# Patient Record
Sex: Male | Born: 1960 | Race: White | Hispanic: No | Marital: Single | Smoking: Never smoker
Health system: Southern US, Community
[De-identification: ages and names within clinical notes are randomized; demographics above are authoritative.]

## PROBLEM LIST (undated history)

## (undated) DIAGNOSIS — K219 Gastro-esophageal reflux disease without esophagitis: Secondary | ICD-10-CM

## (undated) DIAGNOSIS — K589 Irritable bowel syndrome without diarrhea: Secondary | ICD-10-CM

## (undated) DIAGNOSIS — K5792 Diverticulitis of intestine, part unspecified, without perforation or abscess without bleeding: Secondary | ICD-10-CM

## (undated) DIAGNOSIS — E119 Type 2 diabetes mellitus without complications: Secondary | ICD-10-CM

## (undated) DIAGNOSIS — F329 Major depressive disorder, single episode, unspecified: Secondary | ICD-10-CM

## (undated) DIAGNOSIS — G629 Polyneuropathy, unspecified: Secondary | ICD-10-CM

## (undated) DIAGNOSIS — G8929 Other chronic pain: Secondary | ICD-10-CM

## (undated) DIAGNOSIS — F419 Anxiety disorder, unspecified: Secondary | ICD-10-CM

## (undated) DIAGNOSIS — F319 Bipolar disorder, unspecified: Secondary | ICD-10-CM

## (undated) DIAGNOSIS — I10 Essential (primary) hypertension: Secondary | ICD-10-CM

## (undated) DIAGNOSIS — G43909 Migraine, unspecified, not intractable, without status migrainosus: Secondary | ICD-10-CM

## (undated) DIAGNOSIS — N200 Calculus of kidney: Secondary | ICD-10-CM

## (undated) DIAGNOSIS — F32A Depression, unspecified: Secondary | ICD-10-CM

## (undated) HISTORY — PX: CHOLECYSTECTOMY: SHX55

## (undated) HISTORY — PX: CARPAL TUNNEL RELEASE: SHX101

## (undated) HISTORY — PX: COLONOSCOPY: SHX174

## (undated) HISTORY — PX: ANKLE SURGERY: SHX546

---

## 1998-07-06 ENCOUNTER — Encounter: Payer: Self-pay | Admitting: Sports Medicine

## 1998-07-06 ENCOUNTER — Ambulatory Visit (HOSPITAL_COMMUNITY): Admission: RE | Admit: 1998-07-06 | Discharge: 1998-07-06 | Payer: Self-pay | Admitting: Sports Medicine

## 1998-07-19 ENCOUNTER — Encounter: Payer: Self-pay | Admitting: Sports Medicine

## 1998-07-19 ENCOUNTER — Ambulatory Visit (HOSPITAL_COMMUNITY): Admission: RE | Admit: 1998-07-19 | Discharge: 1998-07-19 | Payer: Self-pay | Admitting: Sports Medicine

## 2001-09-01 ENCOUNTER — Ambulatory Visit (HOSPITAL_COMMUNITY): Admission: RE | Admit: 2001-09-01 | Discharge: 2001-09-01 | Payer: Self-pay | Admitting: *Deleted

## 2006-11-27 ENCOUNTER — Inpatient Hospital Stay (HOSPITAL_COMMUNITY): Admission: AD | Admit: 2006-11-27 | Discharge: 2006-11-28 | Payer: Self-pay | Admitting: Cardiology

## 2006-11-27 ENCOUNTER — Ambulatory Visit: Payer: Self-pay | Admitting: Cardiology

## 2008-01-07 ENCOUNTER — Ambulatory Visit: Payer: Self-pay | Admitting: Gastroenterology

## 2008-01-07 ENCOUNTER — Observation Stay (HOSPITAL_COMMUNITY): Admission: EM | Admit: 2008-01-07 | Discharge: 2008-01-08 | Payer: Self-pay | Admitting: Emergency Medicine

## 2008-01-08 ENCOUNTER — Ambulatory Visit: Payer: Self-pay | Admitting: Gastroenterology

## 2008-01-08 ENCOUNTER — Encounter: Payer: Self-pay | Admitting: Gastroenterology

## 2010-10-02 NOTE — Consult Note (Signed)
NAMEMarland Kitchen  [name], [name] NO.:  [id number]   MEDICAL RECORD NO.:  [id number]          PATIENT TYPE:  OBV   LOCATION:  A327                          FACILITY:  [facility]   PHYSICIAN:  Kassie Mends, M.D.      DATE OF BIRTH:  Sep 26, 1960   DATE OF CONSULTATION:  01/07/2008  DATE OF DISCHARGE:                                 CONSULTATION   REFERRING PHYSICIAN:  Dorris Singh, MD.   PRIMARY PHYSICIAN:  Pres[place] Parma, [place] in [place].   REASON FOR CONSULTATION:  Nausea, vomiting and diarrhea.   HISTORY OF PRESENT ILLNESS:  Mr. [name] is a 50 year old male who has  had diabetes for 8-9 years.  He complains of having loose to watery  stools at least two times a day ever since his gallbladder was removed  in 1996.  He states he may vomit one to two times a week and sometimes  he does not vomited at all.  On Monday evening, he started getting  sick.  On Tuesday, he had abdominal pain, diarrhea and vomiting.  He  had five to six watery bowel movements.  No blood.  He states he vomited  4 to 5 times and saw streaks of blood once.  On Wednesday, he had five  watery bowel movements.  No blood.  He vomited three times and saw blood  streaks twice.  He came to the emergency department around 2:00 a.m.  this morning.  He denies any fever.  Pain is on the right side and  radiate towards his naval.  It is crampy and achy.  It is off and on.  It has kept him awake.  He takes Neurontin and etodolac at home for  chronic pain.  He has been on the Neurontin for 5 months and etodolac  for 5 months.  He was prescribed this by Dr. Gordy Councilman.  His brother is  sick as well and recently was discharged from [facility]  for the same symptoms.  The last time he had an upper endoscopy was  approximately 10 years ago.  He says Prilosec helps his heartburn and  indigestion.  He is been tried on Aciphex and Nexium.  Protonix really  helps, but he cannot get that from the [place].  He  complains of having  problems swallowing liquids, but denies any solid dysphagia.  He does  complain of regurgitation of contents back into his throat.  He has  problems swallowing maybe once every 6 months with liquid come back up  into his nose.  He complains of having a bulge on his abdomen that is  sore to the touch.  It is getting sore and bigger.  He denies any  constipation.  He has intentionally lost reportedly from 240 pounds to  180 pounds.  He rarely snorts cocaine.  He denies any alcohol use.  He  does not smoke marijuana.   PAST MEDICAL HISTORY:  1. Hypertension.  2. Bipolar disorder.  3. Gastroesophageal reflux disease.  4. Arthritis.  5. Irritable bowel syndrome noted in the electronic medical record in  July 2008.   PAST SURGICAL HISTORY:  Per the HPI, otherwise right ankle surgery (our  office copy).   ALLERGIES:  PENICILLIN.   HOME MEDICATIONS:  1. Glipizide.  2. Glucophage.  3. Metformin.  4. Hydrochlorothiazide.  5. Lamictal 100 mg twice a day.  6. Lantus.  7. Lexapro.  8. NovoLog.  9. Omeprazole  Home medication list is incomplete.   HOSPITAL MEDICATIONS:  1. Abilify.  2. Lexapro.  3. Neurontin.  4. Hydrochlorothiazide.  5. NovoLog sliding scale.  6. Lantus.  7. Lamictal.  8. Protonix 40 mg IV every 12 hours.  9. Phenergan as needed.  10.Zofran as needed.   FAMILY HISTORY:  He says his may have had polyps.  No family history of  colon cancer.   SOCIAL HISTORY:  He does not smoke.  He is unemployed.  He served from  82-86 in the [employer] and gets his medical care from the [place].   REVIEW OF SYSTEMS:  Per the HPI, otherwise all systems are negative.   PHYSICAL EXAM:  Temperature 97.9, blood pressure 136/70, pulse 86,  respiratory rate 20.  GENERAL:  He is in no apparent distress, alert and oriented x4.  HEENT:  Is atraumatic, normocephalic.  Pupils equal, reactive to light.  Mouth  no oral lesions.  Posterior pharynx without erythema or  exudate.  NECK:  Full range of motion.  No lymphadenopathy.  LUNGS:  Clear to  auscultation bilaterally.  CARDIOVASCULAR:  Shows regular rhythm, no  murmur, normal S1-S2. ABDOMEN:  Bowel sounds are present, soft, mildly  distended, initial exam, nontender, but then the patient began to  complain of abdominal pain in the epigastrium and right upper quadrant  without rebound or guarding.  EXTREMITIES:  No cyanosis or edema.  NEURO:  He has no focal neurologic deficits.   LABS:  White count 11.8, hemoglobin 15.1, platelets 297, glucose 559,  BUN 16, creatinine 1.04, total bili 1.4, alk phos 66, AST 16, ALT 22,  albumin 4.4, lipase 16.  Urine drug screen positive for cocaine.   ASSESSMENT:  Mr. [name] is a 49 year old male with acute onset of  nausea, vomiting and diarrhea.  Acute worsening of his nausea, vomiting  and diarrhea likely secondary to a viral illness.  The differential  diagnosis includes gastroparesis and/or uncontrolled gastroesophageal  reflux disease with irritable bowel syndrome or bile salt induced  diarrhea.  I doubt this is bacterial, gastroenteritis, giardiasis.  Thank you for allowing me to see Mr. [name] in consultation.  My  recommendations follow.   RECOMMENDATIONS:  1. Add Phenergan around-the-clock and Zofran p.r.n.  2. Clear liquids and then n.p.o. after midnight, except medicines.  3. Will schedule an EGD on January 08, 2008, with propofol due to his      chronic use of Lamictal and Neurontin.  Will consider gastric      emptying study if an etiology for his chronic nausea, vomiting      cannot be identified.  4. Once his diet is advance he will be advanced to a lactose-free      diet.  5. Will obtain stool culture, Giardia antigen and fecal lactoferrin.  6. He will be seen as an outpatient and if the above measures do not      improve his symptoms, then will consider a low-fat diet plus or      minus cholestyramine.  He may also need the addition of an  anti-      motility agent.  Kassie Mends, M.D.  Electronically Signed     SM/MEDQ  D:  01/07/2008  T:  01/07/2008  Job:  [id number]   cc:   Prescott Parma  Fax: [phone]

## 2010-10-02 NOTE — Discharge Summary (Signed)
[name], [name]               ACCOUNT NO.:  [id number]   MEDICAL RECORD NO.:  [id number]          PATIENT TYPE:  INP   LOCATION:  3732                         FACILITY:  [facility]   PHYSICIAN:  Noralyn Pick. Eden Emms, MD, FACCDATE OF BIRTH:  [birth year]   DATE OF ADMISSION:  11/27/2006  DATE OF DISCHARGE:  11/28/2006                               DISCHARGE SUMMARY   PROCEDURES:  1. Cardiac catheterization.  2. Coronary arteriogram.  3. Left ventriculogram.   PRIMARY DISCHARGE DIAGNOSIS:  Chest pain, medical therapy for coronary  artery disease with outpatient Myoview to evaluate moderate disease.   SECONDARY DIAGNOSES:  1. Preserved left ventricular function with an ejection fraction of      60% at catheterization.  2. Diabetes.  3. Hypertension.  4. History of bipolar disorder.  5. Irritable bowel syndrome  6. Unknown lipid status   ALLERGIES:  ALLERGY OR INTOLERANCE TO PENICILLIN.   TIME SPENT AT DISCHARGE:  40 minutes.   HOSPITAL COURSE:  [name] is a 50 year old male with no previous  history of coronary artery disease.  He went to [facility]  because of chest pain that was associated with nausea and vomiting.  He  was evaluated by Dr. Andee Lineman who felt that he needed further evaluation,  and he was transferred to [facility] for catheterization.   His initial cardiac enzymes were negative.  A cardiac catheterization  showed a normal left main, and the LAD had a 50% smooth mid- stenosis,  and the first diagonal had a 40% ostial stenosis.  The circumflex and  RCA systems were normal, and his EF was normal as well.  Dr. Eden Emms  evaluated the films and felt that the LAD and diagonal disease was not  critical.  He recommended adding Imdur,  Coreg, and aspirin his  medication regimen and following up with a Myoview prior to being  reassessed in the office.  If the Myoview is  abnormal then he would  need to be referred back to [facility] for intravascular ultrasound  and  possible intervention.   Postcatheterization, [name] was without chest pain or shortness of  breath.  His groin was without ecchymosis, bruit, or hematoma.  He was  discharged on November 28, 2006 with outpatient followup arranged.   DISCHARGE INSTRUCTIONS:  1. His activity level is to be increased gradually.  2. He is not to drive for 2 days and no lifting for week.  3. He is to stick to a low-sodium diabetic diet.  4. He is to call our office for any problems with the catheterization      site.  5. He is to follow up with Dr. Antoine Poche for Dr. Andee Lineman on December 09, 2006 at 9:00 a.m. and see Dr. Dyann Ruddle as needed.   DISCHARGE MEDICATIONS:  1. Lamictal 100 mg b.i.d.  2. Protonix 40 mg daily.  3. Mirapex 0.5 mg q.h.s.  4. Lyrica 75 mg q.h.s.  5. Lantus 100 units daily.  6. Humalog sliding scale daily as needed.  7. HCTZ 12.5 mg daily.  8. Metformin 500  mg b.i.d. is on hold and to be restarted on December 01, 2006.  9. Cozaar 25 mg daily is on hold.  10.Isosorbide mononitrate 30 mg, 1/2 tablet daily.  11.Coreg 3.125 mg b.i.d.  12.Aspirin 81 mg daily  13.Nitroglycerin sublingual p.r.n.      Travis Demark, PA-C      Noralyn Pick. Eden Emms, MD, [facility]  Electronically Signed    RB/MEDQ  D:  11/28/2006  T:  11/30/2006  Job:  [id number]   cc:   [facility] [place], [place]  Fredia Beets Dyann Ruddle

## 2010-10-02 NOTE — Op Note (Signed)
NAMEALFRED, HARREL NO.:  [id number]   MEDICAL RECORD NO.:  [id number]          PATIENT TYPE:  OBV   LOCATION:  A327                          FACILITY:  [facility]   PHYSICIAN:  Kassie Mends, M.D.      DATE OF BIRTH:  [birth year]   DATE OF PROCEDURE:  01/08/2008  DATE OF DISCHARGE:                               OPERATIVE REPORT   PRIMARY PHYSICIAN:  Dr. Dyann Ruddle of [place], [place].   PROCEDURE:  Esophagogastroduodenoscopy with cold forceps biopsy of the  gastric and duodenal mucosa.   INDICATION FOR EXAM:  Mr. Rayman is a 50 year old male who presents with  diabetes, abdominal pain, vomiting, and diarrhea.  He had acute  exacerbation of his symptoms.  He has had 2-3 bowel movements within the  last 44 hours.  He reports vomiting 3-4 times.  Had no blood.  The upper  endoscopy is being performed to evaluate for blood in his emesis and  abdominal pain.   FINDINGS:  1. Normal esophagus, without evidence of Barrett's, mass, erosion,      ulceration, or stricture.  2. Diffuse erythema of the body and the antrum, without erosion.      Biopsies obtained via cold forceps to evaluate for H. pylori      gastritis or eosinophilic gastritis.  3. Patchy erythema in the duodenal bulb.  4. Normal second portion of the duodenum.  Biopsies obtained via cold      forceps to evaluate for celiac sprue due to his complaint of      abdominal pain and diarrhea.   DIAGNOSES:  1. Moderate gastritis.  2. Mild duodenitis.   RECOMMENDATIONS:  1. Continue b.i.d. PPI.  2. Will await biopsies.  3. No aspirin or NSAIDs for 30 days.  No anticoagulation for 5 days.  4. Full liquid diet, and avoid dairy products.  Will advance to a      medium-carbohydrate soft lactose-free diet as tolerated.  5. Continue around-the-clock Phenergan.  6. Will consider a gastric emptying study.   MEDICATIONS:  MAC provided by anesthesia.   PROCEDURE TECHNIQUE:  Physical exam was performed.  Informed  consent was  obtained from the patient explaining the benefits, risks, and  alternatives to the procedure.  The patient was connected to the monitor  and placed in the left lateral position.  Continuous oxygen was provided  by nasal cannula and IV medicine administered through an indwelling  cannula.  After administration of sedation, the patient's esophagus was  intubated, and the  scope was advanced under direct visualization to the second portion of  the duodenum.  The scope was removed slowly by carefully examining the  color, texture, anatomy, and integrity of the mucosa on the way out.  The patient was recovered in endoscopy and discharged to the floor in  satisfactory condition.      Kassie Mends, M.D.  Electronically Signed     SM/MEDQ  D:  01/08/2008  T:  01/08/2008  Job:  [id number]   cc:   Prescott Parma  Fax: [phone]

## 2010-10-02 NOTE — Discharge Summary (Signed)
[name], [name] NO.:  [id number]   MEDICAL RECORD NO.:  [id number]          PATIENT TYPE:  OBV   LOCATION:  A327                          FACILITY:  [facility]   PHYSICIAN:  Dorris Singh, DO    DATE OF BIRTH:  Sep 30, 1960   DATE OF ADMISSION:  01/07/2008  DATE OF DISCHARGE:  08/21/2009LH                               DISCHARGE SUMMARY   ADMISSION DIAGNOSES:  1. Intractable nausea and vomiting.  2. Diarrhea.  3. Insulin dependent diabetes.  4. Cocaine abuse.  5. Leukocytosis.   DISCHARGE DIAGNOSES:  1. Nausea and vomiting, resolved.  2. Gastritis, duodenitis.  3. Insulin dependent diabetes.   PROCEDURES:  He had an EGD on August 21 which demonstrated gastritis and  duodenitis.  He also had a CT of the abdomen and pelvis on August 20  which demonstrated no acute pelvic findings and no acute upper abdominal  findings.   HOSPITAL CONSULTATIONS:  GI, Dr. Kassie Mends saw him.   HISTORY OF PRESENT ILLNESS:  His H&P was done by Dr. Dorris Singh.  Please refer.  To summarize, the patient is a 50 year old Caucasian male  who presented to the emergency room with nausea and vomiting and also  with diarrhea.  He states too that he has not been taking his insulin  and his blood sugars when he presented were in the 300-500 range.  He  was admitted, then on admission his blood sugars were placed under  better control and he was admitted for the above diagnoses.  Also, the  patient admitted to be having used cocaine about 3 days before as well.  Discussed extensively with the patient regarding his cocaine use and the  dangers of using it and the patient stated understanding.  While he was  here we consulted GI to come see him and they recommended that due to  the patient's history of insulin dependent diabetes, also due to nausea  and vomiting a gastric emptying study would be advised as well as an  EGD.  The patient was then prepped for the EGD for today and also  a  nutritional consult was made as well for him for his diabetes.  Discussed the findings with Dr. Cira Servant of his EGD.  It has been  recommended that he take a proton pump inhibitor b.i.d. and that he  follows up with her in a few weeks for his biopsy results, that he  continues to advance his diet and stays on a lactose-free diet, use his  Phenergan around the clock and as needed and he should consider a  gastric emptying study.  After talking with Dr. Cira Servant the patient seemed  to be improving.  Based on his improvement if he can eat we will go  ahead and discharge him to home today.  He will be discharged on the  following medications.   DISCHARGE MEDICATIONS:  1. Lexapro 40 mg b.i.d.  2. Lamictal 100 mg b.i.d.  3. Lantus 40 units at bedtime.  4. NovoLog sliding scale as recommended.  5. HCTZ 12.5 mg daily.  6. Etodolac for pain as  needed.  7. Neurontin 200 mg three times a day.  8. Abilify 10 mg at bedtime.  9. Prilosec 40 mg daily.  10.He will go home on Protonix 40 mg p.o. daily b.i.d.  11.We will stop his Prilosec.  12.Phenergan 12.5 mg p.o. t.i.d. p.r.n. nausea.   His recommendations are that he go home on a lactose-free diet, that he  sees Dr. Cira Servant in 2 weeks and that he follows up with his primary care  physician in 1 week.  Also he is to eat a bland diet for the next few  days and then still continue with a lactose-free diet as well.   DISCHARGE CONDITION:  His condition is stable.   DISPOSITION:  Will be to home.   DISCHARGE PHYSICAL EXAMINATION:  VITAL SIGNS:  Current vitals are as  follows, temperature is 98.0, pulse of 62, respirations 16, blood  pressure is 120/76.  HEART:  The patient's heart is regular rate and rhythm.  LUNGS:  Lungs clear to auscultation bilaterally.  ABDOMEN:  Soft, nontender, nondistended.  EXTREMITIES:  Positive pulses.  No ecchymosis, edema or cyanosis.  His current blood sugar is 189 and we are waiting any further stool  cultures that  have not come.      Dorris Singh, DO  Electronically Signed     CB/MEDQ  D:  01/08/2008  T:  01/08/2008  Job:  [id number]

## 2010-10-02 NOTE — H&P (Signed)
[name], EVERETT NO.:  [id number]   MEDICAL RECORD NO.:  [id number]          PATIENT TYPE:  OBV   LOCATION:  A327                          FACILITY:  [facility]   PHYSICIAN:  Dorris Singh, DO    DATE OF BIRTH:  1960/07/06   DATE OF ADMISSION:  01/07/2008  DATE OF DISCHARGE:  LH                              HISTORY & PHYSICAL   The patient is a 50 year old Caucasian male who presented to the  emergency room with the chief complaint of nausea and vomiting.  The  patient's primary care physician is not in town.  Apparently the patient  states that the nausea, vomiting and diarrhea occurred for the last  couple of days with several bouts of green diarrhea, has been unable to  keep anything down and has not taken his diabetic medication.  His blood  sugars have been running from the 300s to the 400s.  He denies any  fever, chills, chest pain, palpitations, cough or abdominal pain.   PAST MEDICAL HISTORY:  Significant for insulin dependent diabetes,  hypertension, bipolar disorder, GERD, irritable bowel syndrome, migraine  headache.  He has had a cholecystectomy.   ALLERGIES:  PENICILLIN.   MEDICATIONS:  1. He is on several medications, Glipizide once a day.  2. Glucophage 500 mg twice a day.  3. Hydrochlorothiazide 12.5 mg once a day.  4. __________ mg twice a day.  5. Lantus 40 units at night.  6. Lexapro 40 mg twice a day.  7. NovoLog subcu sliding scale.  8. Omeprazole 20 mg.   REVIEW OF SYSTEMS:  CONSTITUTIONAL:  Positive for fatigue and weakness.  EYES:  Denies eye pain or change in vision.  EARS, NOSE, MOUTH, THROAT:  All negative.  CARDIOVASCULAR:  Negative for chest pain and  palpitations.  RESPIRATORY:  Negative for rales, rhonchi or wheeze.  GI:  Positive for nausea, vomiting, diarrhea.  Negative for abdominal pain.  GU:  Negative for dysuria or flank pain.  MUSCULOSKELETAL:  Negative for  back or neck pain.  SKIN:  Negative for rash or  lesions.   PHYSICAL EXAMINATION:  VITAL SIGNS:  Temperature 97.9, pulse 86,  respirations 20, blood pressure 136/70.  GENERAL:  The patient is a well-developed, well-nourished Caucasian male  who is in no acute distress.  HEAD:  Normocephalic, atraumatic.  EYES:  PERRLA, EOMI.  EARS:  TMs visualized bilaterally.  NECK:  Supple with no lymphadenopathy.  HEART:  Regular rate and rhythm.  S1-S2 present.  LUNGS:  Clear to auscultation bilaterally.  No wheezes, rales or  rhonchi.  ABDOMEN:  Abdomen is diffusely tender on deep palpation.  Bowel sounds  are hyperactive.  There is no organomegaly noted.  EXTREMITIES:  Positive pulses.  No ecchymosis, edema or cyanosis.   LABS:  For today white count 11.8, hemoglobin of 15.1, hematocrit of  45.0, platelet count of 297, sodium 134, potassium 3.9, chloride 99, CO2  24, glucose 559, BUN 16, creatinine 1.04.  His lipase is 16.  Acetone  negative.  Troponins are negative.  Valproic acid is 10.  Positive for  cocaine  and urine has 1000 mg.   ASSESSMENT:  Intractable nausea, vomiting, diarrhea, insulin dependent  diabetic and cocaine abuse and leukocytosis.   PLAN:  Will admit the patient to the service of [facility] for  observation.  Will start the patient on IV hydration as well as DVT and  GI prophylaxis.  Will continue with antiemetics.  We will also consult  GI to see him.  Counseled the patient on cocaine use as well and will  put him on a glycemic protocol and continue to monitor him and change  therapy as necessary.      Dorris Singh, DO  Electronically Signed     CB/MEDQ  D:  01/07/2008  T:  01/07/2008  Job:  [id number]

## 2010-10-02 NOTE — Cardiovascular Report (Signed)
NAMEBARNELL, SHIEH               ACCOUNT NO.:  [id number]   MEDICAL RECORD NO.:  [id number]          PATIENT TYPE:  INP   LOCATION:  3732                         FACILITY:  [facility]   PHYSICIAN:  Noralyn Pick. Eden Emms, MD, FACCDATE OF BIRTH:  [birth year]   DATE OF PROCEDURE:  DATE OF DISCHARGE:                            CARDIAC CATHETERIZATION   Coronary arteriography   A 50 year old diabetic with substernal chest pain.   Cine catheterization done with 6-French catheters from the right femoral  artery.  At the end of the case the arteriotomy was sealed with the  angio-seal device with good hemostasis.   Left main coronary was normal.   Left anterior descending artery had a 50% smooth lesion in the  midvessel.  Second diagonal branch had 40-50% tubular disease at the  ostium and proximally.  Remainder of the LAD was normal.  Circumflex  coronary artery was normal.  There was a very large obtuse marginal  branch, which was normal.  Right coronary was dominant.  It was normal.   RAO VENTRICULOGRAPHY:  RAO ventriculography was normal.  ER was 60%.  There was no gradient across the aortic valve and no MR.  Aortic  pressure is 112/74, LV pressure is 112/90.   IMPRESSION:  The patient does not have flow-limiting lesions.  The left  anterior descending artery lesion is smooth and only 50%.  He will be  discharged later today.  I would discharge the patient on 15 mg of Imdur  and Coreg 3.125 mg b.i.d. as well as an aspirin.  He will have an  outpatient Myoview study with Dr. Andee Lineman in the next couple of weeks.      Noralyn Pick. Eden Emms, MD, [facility]  Electronically Signed     PCN/MEDQ  D:  11/28/2006  T:  11/29/2006  Job:  [id number]   cc:   Learta Codding, MD,FACC  Dr. Virgina Organ

## 2011-03-05 LAB — COMPREHENSIVE METABOLIC PANEL
ALT: 184 — ABNORMAL HIGH
AST: 370 — ABNORMAL HIGH
CO2: 27
Chloride: 106
GFR calc Af Amer: >60
GFR calc non Af Amer: >60
Sodium: 140
Total Bilirubin: 1.8 — ABNORMAL HIGH

## 2011-03-05 LAB — CBC
RBC: 4.44
WBC: 6.8

## 2012-01-11 DIAGNOSIS — R112 Nausea with vomiting, unspecified: Secondary | ICD-10-CM

## 2012-12-18 ENCOUNTER — Ambulatory Visit: Payer: TRICARE For Life (TFL) | Admitting: Physical Therapy

## 2014-01-06 ENCOUNTER — Emergency Department (HOSPITAL_COMMUNITY)
Admission: EM | Admit: 2014-01-06 | Discharge: 2014-01-07 | Disposition: A | Discharge status: Home / Self Care | Payer: Non-veteran care | Attending: Emergency Medicine | Admitting: Emergency Medicine

## 2014-01-06 ENCOUNTER — Encounter (HOSPITAL_COMMUNITY): Payer: Self-pay | Admitting: Emergency Medicine

## 2014-01-06 ENCOUNTER — Emergency Department (HOSPITAL_COMMUNITY): Payer: Non-veteran care

## 2014-01-06 DIAGNOSIS — IMO0002 Reserved for concepts with insufficient information to code with codable children: Secondary | ICD-10-CM | POA: Insufficient documentation

## 2014-01-06 DIAGNOSIS — I10 Essential (primary) hypertension: Secondary | ICD-10-CM | POA: Diagnosis not present

## 2014-01-06 DIAGNOSIS — Z880 Allergy status to penicillin: Secondary | ICD-10-CM | POA: Diagnosis not present

## 2014-01-06 DIAGNOSIS — S060X1A Concussion with loss of consciousness of 30 minutes or less, initial encounter: Secondary | ICD-10-CM | POA: Insufficient documentation

## 2014-01-06 DIAGNOSIS — Z794 Long term (current) use of insulin: Secondary | ICD-10-CM | POA: Diagnosis not present

## 2014-01-06 DIAGNOSIS — S0990XA Unspecified injury of head, initial encounter: Secondary | ICD-10-CM | POA: Diagnosis present

## 2014-01-06 DIAGNOSIS — S63501A Unspecified sprain of right wrist, initial encounter: Secondary | ICD-10-CM

## 2014-01-06 DIAGNOSIS — Z8669 Personal history of other diseases of the nervous system and sense organs: Secondary | ICD-10-CM | POA: Diagnosis not present

## 2014-01-06 DIAGNOSIS — S3981XA Other specified injuries of abdomen, initial encounter: Secondary | ICD-10-CM | POA: Diagnosis not present

## 2014-01-06 DIAGNOSIS — F17200 Nicotine dependence, unspecified, uncomplicated: Secondary | ICD-10-CM | POA: Diagnosis not present

## 2014-01-06 DIAGNOSIS — T07XXXA Unspecified multiple injuries, initial encounter: Secondary | ICD-10-CM

## 2014-01-06 DIAGNOSIS — Z79899 Other long term (current) drug therapy: Secondary | ICD-10-CM | POA: Insufficient documentation

## 2014-01-06 DIAGNOSIS — Z9889 Other specified postprocedural states: Secondary | ICD-10-CM | POA: Insufficient documentation

## 2014-01-06 DIAGNOSIS — E119 Type 2 diabetes mellitus without complications: Secondary | ICD-10-CM | POA: Insufficient documentation

## 2014-01-06 HISTORY — DX: Polyneuropathy, unspecified: G62.9

## 2014-01-06 HISTORY — DX: Type 2 diabetes mellitus without complications: E11.9

## 2014-01-06 HISTORY — DX: Essential (primary) hypertension: I10

## 2014-01-06 LAB — CBG MONITORING, ED: GLUCOSE-CAPILLARY: 160 mg/dL — AB (ref 70–99)

## 2014-01-06 MED ORDER — OXYCODONE-ACETAMINOPHEN 5-325 MG PO TABS
1.0000 | ORAL_TABLET | Freq: Once | ORAL | Status: AC
  Administered 2014-01-07: 1 via ORAL
  Filled 2014-01-06: qty 1
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
## 2014-01-07 ENCOUNTER — Other Ambulatory Visit (HOSPITAL_COMMUNITY): Payer: Non-veteran care

## 2014-01-07 ENCOUNTER — Emergency Department (HOSPITAL_COMMUNITY): Payer: Non-veteran care

## 2014-01-07 MED ORDER — OXYCODONE-ACETAMINOPHEN 5-325 MG PO TABS
1.0000 | ORAL_TABLET | Freq: Once | ORAL | Status: AC
  Administered 2014-01-07: 1 via ORAL
  Filled 2014-01-07: qty 1

## 2014-01-07 MED ORDER — OXYCODONE-ACETAMINOPHEN 5-325 MG PO TABS: 1.0000 | ORAL_TABLET | ORAL | Status: DC | PRN

## 2014-01-07 NOTE — Discharge Instructions (Signed)
Abrasion An abrasion is a cut or scrape of the skin. Abrasions do not extend through all layers of the skin and most heal within 10 days. It is important to care for your abrasion properly to prevent infection. CAUSES  Most abrasions are caused by falling on, or gliding across, the ground or other surface. When your skin rubs on something, the outer and inner layer of skin rubs off, causing an abrasion. DIAGNOSIS  Your caregiver will be able to diagnose an abrasion during a physical exam.  TREATMENT  Your treatment depends on how large and deep the abrasion is. Generally, your abrasion will be cleaned with water and a mild soap to remove any dirt or debris. An antibiotic ointment may be put over the abrasion to prevent an infection. A bandage (dressing) may be wrapped around the abrasion to keep it from getting dirty.  You may need a tetanus shot if:  You cannot remember when you had your last tetanus shot.  You have never had a tetanus shot.  The injury broke your skin. If you get a tetanus shot, your arm may swell, get red, and feel warm to the touch. This is common and not a problem. If you need a tetanus shot and you choose not to have one, there is a rare chance of getting tetanus. Sickness from tetanus can be serious.  HOME CARE INSTRUCTIONS   If a dressing was applied, change it at least once a day or as directed by your caregiver. If the bandage sticks, soak it off with warm water.   Wash the area with water and a mild soap to remove all the ointment 2 times a day. Rinse off the soap and pat the area dry with a clean towel.   Reapply any ointment as directed by your caregiver. This will help prevent infection and keep the bandage from sticking. Use gauze over the wound and under the dressing to help keep the bandage from sticking.   Change your dressing right away if it becomes wet or dirty.   Only take over-the-counter or prescription medicines for pain, discomfort, or fever as  directed by your caregiver.   Follow up with your caregiver within 24-48 hours for a wound check, or as directed. If you were not given a wound-check appointment, look closely at your abrasion for redness, swelling, or pus. These are signs of infection. SEEK IMMEDIATE MEDICAL CARE IF:   You have increasing pain in the wound.   You have redness, swelling, or tenderness around the wound.   You have pus coming from the wound.   You have a fever or persistent symptoms for more than 2-3 days.  You have a fever and your symptoms suddenly get worse.  You have a bad smell coming from the wound or dressing.  MAKE SURE YOU:   Understand these instructions.  Will watch your condition.  Will get help right away if you are not doing well or get worse. Document Released: 02/13/2005 Document Revised: 04/22/2012 Document Reviewed: 04/09/2011 [facility] Patient Information 2015 [place], [place]. This information is not intended to replace advice given to you by your health care provider. Make sure you discuss any questions you have with your health care provider.   You have had a head injury which does not appear to require admission at this time. A concussion is a state of changed mental ability from trauma.  SEEK IMMEDIATE MEDICAL ATTENTION IF: There is confusion or drowsiness (although children frequently become drowsy after  injury).  You cannot awaken the injured person.  There is nausea (feeling sick to your stomach) or continued, forceful vomiting.  You notice dizziness or unsteadiness which is getting worse, or inability to walk.  You have convulsions or unconsciousness.  You experience severe, persistent headaches not relieved by Tylenol. (Do not take aspirin as this impairs clotting abilities). Take other pain medications only as directed.  You cannot use arms or legs normally.  There are changes in pupil sizes. (This is the black center in the colored part of the eye)  There is clear  or bloody discharge from the nose or ears.  Change in speech, vision, swallowing, or understanding.  Localized weakness, numbness, tingling, or change in bowel or bladder control.

## 2014-10-31 ENCOUNTER — Emergency Department (HOSPITAL_COMMUNITY)
Admission: EM | Admit: 2014-10-31 | Discharge: 2014-11-01 | Disposition: A | Discharge status: Home / Self Care | Payer: Non-veteran care | Attending: Emergency Medicine | Admitting: Emergency Medicine

## 2014-10-31 ENCOUNTER — Encounter (HOSPITAL_COMMUNITY): Payer: Self-pay | Admitting: *Deleted

## 2014-10-31 DIAGNOSIS — M79642 Pain in left hand: Secondary | ICD-10-CM | POA: Diagnosis present

## 2014-10-31 DIAGNOSIS — K219 Gastro-esophageal reflux disease without esophagitis: Secondary | ICD-10-CM | POA: Insufficient documentation

## 2014-10-31 DIAGNOSIS — E119 Type 2 diabetes mellitus without complications: Secondary | ICD-10-CM | POA: Diagnosis not present

## 2014-10-31 DIAGNOSIS — Z794 Long term (current) use of insulin: Secondary | ICD-10-CM | POA: Insufficient documentation

## 2014-10-31 DIAGNOSIS — Z880 Allergy status to penicillin: Secondary | ICD-10-CM | POA: Diagnosis not present

## 2014-10-31 DIAGNOSIS — L03012 Cellulitis of left finger: Secondary | ICD-10-CM | POA: Insufficient documentation

## 2014-10-31 DIAGNOSIS — F329 Major depressive disorder, single episode, unspecified: Secondary | ICD-10-CM | POA: Diagnosis not present

## 2014-10-31 DIAGNOSIS — G629 Polyneuropathy, unspecified: Secondary | ICD-10-CM | POA: Diagnosis not present

## 2014-10-31 DIAGNOSIS — Z79899 Other long term (current) drug therapy: Secondary | ICD-10-CM | POA: Insufficient documentation

## 2014-10-31 DIAGNOSIS — I10 Essential (primary) hypertension: Secondary | ICD-10-CM | POA: Diagnosis not present

## 2014-10-31 DIAGNOSIS — IMO0001 Reserved for inherently not codable concepts without codable children: Secondary | ICD-10-CM

## 2014-10-31 DIAGNOSIS — Z87448 Personal history of other diseases of urinary system: Secondary | ICD-10-CM | POA: Insufficient documentation

## 2014-10-31 DIAGNOSIS — F419 Anxiety disorder, unspecified: Secondary | ICD-10-CM | POA: Insufficient documentation

## 2014-10-31 HISTORY — DX: Major depressive disorder, single episode, unspecified: F32.9

## 2014-10-31 HISTORY — DX: Gastro-esophageal reflux disease without esophagitis: K21.9

## 2014-10-31 HISTORY — DX: Diverticulitis of intestine, part unspecified, without perforation or abscess without bleeding: K57.92

## 2014-10-31 HISTORY — DX: Depression, unspecified: F32.A

## 2014-10-31 HISTORY — DX: Anxiety disorder, unspecified: F41.9

## 2014-10-31 MED ORDER — LIDOCAINE HCL (PF) 2 % IJ SOLN
10.0000 mL | Freq: Once | INTRAMUSCULAR | Status: AC
  Administered 2014-10-31: 10 mL
  Filled 2014-10-31: qty 10

## 2014-10-31 NOTE — ED Notes (Signed)
Pt c/o pain and swelling to left middle finger nail

## 2014-11-01 LAB — CBG MONITORING, ED: Glucose-Capillary: 181 mg/dL — ABNORMAL HIGH (ref 65–99)

## 2014-11-01 MED ORDER — DOXYCYCLINE HYCLATE 100 MG PO TABS
100.0000 mg | ORAL_TABLET | Freq: Once | ORAL | Status: AC
  Administered 2014-11-01: 100 mg via ORAL
  Filled 2014-11-01: qty 1

## 2014-11-01 MED ORDER — LEVOFLOXACIN 500 MG PO TABS
500.0000 mg | ORAL_TABLET | Freq: Once | ORAL | Status: AC
  Administered 2014-11-01: 500 mg via ORAL
  Filled 2014-11-01: qty 1

## 2014-11-01 MED ORDER — HYDROCODONE-ACETAMINOPHEN 5-325 MG PO TABS: 1.0000 | ORAL_TABLET | ORAL | Status: DC | PRN

## 2014-11-01 MED ORDER — LEVOFLOXACIN 500 MG PO TABS: 500.0000 mg | ORAL_TABLET | Freq: Every day | ORAL | Status: DC

## 2014-11-01 MED ORDER — DOXYCYCLINE HYCLATE 100 MG PO CAPS: 100.0000 mg | ORAL_CAPSULE | Freq: Two times a day (BID) | ORAL | Status: DC

## 2014-11-01 MED ORDER — HYDROCODONE-ACETAMINOPHEN 5-325 MG PO TABS
2.0000 | ORAL_TABLET | Freq: Once | ORAL | Status: AC
  Administered 2014-11-01: 2 via ORAL
  Filled 2014-11-01: qty 2

## 2014-11-01 MED ORDER — ONDANSETRON HCL 4 MG PO TABS
4.0000 mg | ORAL_TABLET | Freq: Once | ORAL | Status: AC
  Administered 2014-11-01: 4 mg via ORAL
  Filled 2014-11-01: qty 1

## 2014-11-01 NOTE — ED Provider Notes (Signed)
CSN: [id number]     Arrival date & time 10/31/14  2229 History   First MD Initiated Contact with Patient 10/31/14 2323     Chief Complaint  Patient presents with  . Hand Pain     (Consider location/radiation/quality/duration/timing/severity/associated sxs/prior Treatment) HPI Comments: Patient is a 54 year old male who presents to the emergency department with pain and swelling of the left middle finger.  Patient states that he is an insulin requiring diabetic with diabetic neuropathies. The patient states that on yesterday he he noticed some discoloration and swelling of his left middle finger. He does not recall injuring it on. Today he noticed that the almost the entire nail was white underneath and there was increased redness and swelling of the distal portion of the finger. He presents now for evaluation of this. He is particularly concerned because of his diabetic status. He states that yesterday he did not have pain other than his neuropathy pain, but today he can at times feel his heartbeat in the tip of his finger.  The history is provided by the patient.    Past Medical History  Diagnosis Date  . Diabetes mellitus without complication   . Neuropathy   . Hypertension   . GERD (gastroesophageal reflux disease)   . Diverticulitis   . Renal disorder     kidney stones  . Depression   . Anxiety    Past Surgical History  Procedure Laterality Date  . Carpal tunnel release    . Colonoscopy    . Cholecystectomy    . Ankle surgery Right    History reviewed. No pertinent family history. History  Substance Use Topics  . Smoking status: Never Smoker   . Smokeless tobacco: Not on file  . Alcohol Use: No    Review of Systems  Skin: Positive for wound.  Neurological: Positive for numbness.  Psychiatric/Behavioral: The patient is nervous/anxious.        Depression  All other systems reviewed and are negative.     Allergies  Meloxicam and Penicillins  Home Medications    Prior to Admission medications   Medication Sig Start Date End Date Taking? Authorizing Provider  clonazePAM (KLONOPIN) 1 MG tablet Take 1 mg by mouth 3 (three) times daily as needed for anxiety.    Historical Provider, MD  escitalopram (LEXAPRO) 20 MG tablet Take 20 mg by mouth daily.    Historical Provider, MD  insulin aspart (NOVOLOG) 100 UNIT/ML injection Inject 2-5 Units into the skin 3 (three) times daily before meals.    Historical Provider, MD  insulin glargine (LANTUS) 100 UNIT/ML injection Inject 60 Units into the skin 2 (two) times daily.    Historical Provider, MD  lisinopril (PRINIVIL,ZESTRIL) 20 MG tablet Take 20 mg by mouth daily.    Historical Provider, MD  lurasidone (LATUDA) 40 MG TABS tablet Take 40 mg by mouth daily with breakfast.    Historical Provider, MD  metFORMIN (GLUCOPHAGE) 1000 MG tablet Take 1,000 mg by mouth 3 (three) times daily.    Historical Provider, MD  metoprolol (LOPRESSOR) 100 MG tablet Take 100 mg by mouth 2 (two) times daily.    Historical Provider, MD  nortriptyline (PAMELOR) 75 MG capsule Take 75 mg by mouth 3 (three) times daily.    Historical Provider, MD  omeprazole (PRILOSEC) 20 MG capsule Take 20 mg by mouth daily.    Historical Provider, MD  ondansetron (ZOFRAN-ODT) 4 MG disintegrating tablet Take 4 mg by mouth every 8 (eight) hours as needed for  nausea or vomiting.    Historical Provider, MD  oxyCODONE-acetaminophen (PERCOCET/ROXICET) 5-325 MG per tablet Take 1 tablet by mouth every 4 (four) hours as needed for severe pain. 01/07/14   Ripley Fraise, MD  pravastatin (PRAVACHOL) 10 MG tablet Take 10 mg by mouth daily.    Historical Provider, MD  pregabalin (LYRICA) 200 MG capsule Take 200 mg by mouth 3 (three) times daily.    Historical Provider, MD  vitamin B-12 (CYANOCOBALAMIN) 100 MCG tablet Take 100 mcg by mouth daily.    Historical Provider, MD  Vitamin D, Ergocalciferol, (DRISDOL) 50000 UNITS CAPS capsule Take 50,000 Units by mouth every 7  (seven) days.    Historical Provider, MD  zolpidem (AMBIEN) 10 MG tablet Take 10 mg by mouth at bedtime as needed for sleep.    Historical Provider, MD   BP 107/78 mmHg  Pulse 108  Temp(Src) 98.1 F (36.7 C) (Oral)  Resp 20  Ht 5' 10.5" (1.791 m)  Wt 182 lb (82.555 kg)  BMI 25.74 kg/m2  SpO2 100% Physical Exam  Constitutional: He is oriented to person, place, and time. He appears well-developed and well-nourished.  Non-toxic appearance.  HENT:  Head: Normocephalic.  Right Ear: Tympanic membrane and external ear normal.  Left Ear: Tympanic membrane and external ear normal.  Eyes: EOM and lids are normal. Pupils are equal, round, and reactive to light.  Neck: Normal range of motion. Neck supple. Carotid bruit is not present.  Cardiovascular: Normal rate, regular rhythm, normal heart sounds, intact distal pulses and normal pulses.   Pulmonary/Chest: Breath sounds normal. No respiratory distress.  Abdominal: Soft. Bowel sounds are normal. There is no tenderness. There is no guarding.  Musculoskeletal: Normal range of motion.  There is increased swelling of the distal portion of the left middle finger. There appears to be pus under the fingernail. There no red streaks appreciated. The finger is not hot. There is full range of motion of the wrist, elbow, and shoulder on the left. No hot joints appreciated.  Lymphadenopathy:       Head (right side): No submandibular adenopathy present.       Head (left side): No submandibular adenopathy present.    He has no cervical adenopathy.  Neurological: He is alert and oriented to person, place, and time. He has normal strength. No cranial nerve deficit or sensory deficit.  Skin: Skin is warm and dry.  Psychiatric: He has a normal mood and affect. His speech is normal.  Nursing note and vitals reviewed.   ED Course  INCISION AND DRAINAGE Date/Time: 11/01/2014 12:27 AM Performed by: Lily Kocher Authorized by: Lily Kocher Consent: Verbal  consent obtained. Risks and benefits: risks, benefits and alternatives were discussed Consent given by: patient Patient understanding: patient states understanding of the procedure being performed Patient identity confirmed: arm band Time out: Immediately prior to procedure a "time out" was called to verify the correct patient, procedure, equipment, support staff and site/side marked as required. Indications for incision and drainage: Paronychia. Body area: upper extremity Location details: left long finger Anesthesia: hematoma block Local anesthetic: lidocaine 2% without epinephrine Anesthetic total: 6 ml Patient sedated: no Scalpel size: 11 Complexity: simple Drainage: purulent Drainage amount: moderate Wound treatment: wound left open Patient tolerance: Patient tolerated the procedure well with no immediate complications Comments: Cultlure sent to the lab.   (including critical care time) Labs Review Labs Reviewed  WOUND CULTURE  CBG MONITORING, ED    Imaging Review No results found.   EKG  Interpretation None      MDM  Patient noted to have an infected paronychia extending under the nail bed. This was drained and culture sent to the lab. The patient will be placed on Levaquin and doxycycline. Patient will follow-up with the [facility] in 3 days for recheck. Patient is also advised to monitor his glucose carefully.    Final diagnoses:  None    *I have reviewed nursing notes, vital signs, and all appropriate lab and imaging results for this patient.[address] PA-C 11/01/14 0030  Rolland Porter, MD 11/01/14 [phone]

## 2014-11-01 NOTE — Discharge Instructions (Signed)
Please soak your finger in warm water daily for 10 minutes. Please apply fresh dressing daily. Please see your physician at the [facility] in 3 days for recheck, or return to the emergency department in 3 days if unable to see your [facility]. Please use Levaquin and doxycycline daily with food. Use Tylenol or ibuprofen for mild pain, use Norco for more severe pain. Norco may cause drowsiness, please use with caution. Paronychia Paronychia is an inflammatory reaction involving the folds of the skin surrounding the fingernail. This is commonly caused by an infection in the skin around a nail. The most common cause of paronychia is frequent wetting of the hands (as seen with bartenders, food servers, nurses or others who wet their hands). This makes the skin around the fingernail susceptible to infection by bacteria (germs) or fungus. Other predisposing factors are:  Aggressive manicuring.  Nail biting.  Thumb sucking. The most common cause is a staphylococcal (a type of germ) infection, or a fungal (Candida) infection. When caused by a germ, it usually comes on suddenly with redness, swelling, pus and is often painful. It may get under the nail and form an abscess (collection of pus), or form an abscess around the nail. If the nail itself is infected with a fungus, the treatment is usually prolonged and may require oral medicine for up to one year. Your caregiver will determine the length of time treatment is required. The paronychia caused by bacteria (germs) may largely be avoided by not pulling on hangnails or picking at cuticles. When the infection occurs at the tips of the finger it is called felon. When the cause of paronychia is from the herpes simplex virus (HSV) it is called herpetic whitlow. TREATMENT  When an abscess is present treatment is often incision and drainage. This means that the abscess must be cut open so the pus can get out. When this is done, the  following home care instructions should be followed. HOME CARE INSTRUCTIONS   It is important to keep the affected fingers very dry. Rubber or plastic gloves over cotton gloves should be used whenever the hand must be placed in water.  Keep wound clean, dry and dressed as suggested by your caregiver between warm soaks or warm compresses.  Soak in warm water for fifteen to twenty minutes three to four times per day for bacterial infections. Fungal infections are very difficult to treat, so often require treatment for long periods of time.  For bacterial (germ) infections take antibiotics (medicine which kill germs) as directed and finish the prescription, even if the problem appears to be solved before the medicine is gone.  Only take over-the-counter or prescription medicines for pain, discomfort, or fever as directed by your caregiver. SEEK IMMEDIATE MEDICAL CARE IF:  You have redness, swelling, or increasing pain in the wound.  You notice pus coming from the wound.  You have a fever.  You notice a bad smell coming from the wound or dressing. Document Released: 10/30/2000 Document Revised: 07/29/2011 Document Reviewed: 07/01/2008 [facility] Patient Information 2015 [place], [place]. This information is not intended to replace advice given to you by your health care provider. Make sure you discuss any questions you have with your health care provider.

## 2014-11-03 LAB — WOUND CULTURE

## 2014-12-04 ENCOUNTER — Inpatient Hospital Stay (HOSPITAL_COMMUNITY)
Admission: EM | Admit: 2014-12-04 | Discharge: 2014-12-05 | DRG: 638 | Disposition: A | Discharge status: Home / Self Care | Payer: Non-veteran care | Attending: Internal Medicine | Admitting: Internal Medicine

## 2014-12-04 ENCOUNTER — Encounter (HOSPITAL_COMMUNITY): Payer: Self-pay | Admitting: Emergency Medicine

## 2014-12-04 ENCOUNTER — Emergency Department (HOSPITAL_COMMUNITY): Payer: Non-veteran care

## 2014-12-04 DIAGNOSIS — K219 Gastro-esophageal reflux disease without esophagitis: Secondary | ICD-10-CM | POA: Diagnosis present

## 2014-12-04 DIAGNOSIS — R509 Fever, unspecified: Secondary | ICD-10-CM | POA: Diagnosis present

## 2014-12-04 DIAGNOSIS — Z794 Long term (current) use of insulin: Secondary | ICD-10-CM

## 2014-12-04 DIAGNOSIS — L03116 Cellulitis of left lower limb: Secondary | ICD-10-CM | POA: Diagnosis present

## 2014-12-04 DIAGNOSIS — Z833 Family history of diabetes mellitus: Secondary | ICD-10-CM

## 2014-12-04 DIAGNOSIS — I10 Essential (primary) hypertension: Secondary | ICD-10-CM

## 2014-12-04 DIAGNOSIS — E1165 Type 2 diabetes mellitus with hyperglycemia: Secondary | ICD-10-CM | POA: Diagnosis present

## 2014-12-04 DIAGNOSIS — R739 Hyperglycemia, unspecified: Secondary | ICD-10-CM

## 2014-12-04 DIAGNOSIS — E11628 Type 2 diabetes mellitus with other skin complications: Principal | ICD-10-CM | POA: Diagnosis present

## 2014-12-04 DIAGNOSIS — E1140 Type 2 diabetes mellitus with diabetic neuropathy, unspecified: Secondary | ICD-10-CM | POA: Diagnosis present

## 2014-12-04 HISTORY — DX: Irritable bowel syndrome, unspecified: K58.9

## 2014-12-04 HISTORY — DX: Migraine, unspecified, not intractable, without status migrainosus: G43.909

## 2014-12-04 HISTORY — DX: Other chronic pain: G89.29

## 2014-12-04 HISTORY — DX: Calculus of kidney: N20.0

## 2014-12-04 HISTORY — DX: Bipolar disorder, unspecified: F31.9

## 2014-12-04 LAB — COMPREHENSIVE METABOLIC PANEL
ALK PHOS: 75 U/L (ref 38–126)
ALT: 19 U/L (ref 17–63)
AST: 11 U/L — ABNORMAL LOW (ref 15–41)
Albumin: 3.4 g/dL — ABNORMAL LOW (ref 3.5–5.0)
Anion gap: 10 (ref 5–15)
BUN: 10 mg/dL (ref 6–20)
CO2: 23 mmol/L (ref 22–32)
Calcium: 8.7 mg/dL — ABNORMAL LOW (ref 8.9–10.3)
Chloride: 100 mmol/L — ABNORMAL LOW (ref 101–111)
Creatinine, Ser: 0.92 mg/dL (ref 0.61–1.24)
GFR calc non Af Amer: >60 mL/min (ref >60)
GLUCOSE: 409 mg/dL — AB (ref 65–99)
Potassium: 4 mmol/L (ref 3.5–5.1)
Sodium: 133 mmol/L — ABNORMAL LOW (ref 135–145)
TOTAL PROTEIN: 6.8 g/dL (ref 6.5–8.1)
Total Bilirubin: 1.7 mg/dL — ABNORMAL HIGH (ref 0.3–1.2)

## 2014-12-04 LAB — URINALYSIS, ROUTINE W REFLEX MICROSCOPIC
Bilirubin Urine: NEGATIVE
Hgb urine dipstick: NEGATIVE
Ketones, ur: 40 mg/dL — AB
LEUKOCYTES UA: NEGATIVE
Nitrite: NEGATIVE
Protein, ur: NEGATIVE mg/dL
Specific Gravity, Urine: <1.005 — ABNORMAL LOW (ref 1.005–1.030)
UROBILINOGEN UA: 0.2 mg/dL (ref 0.0–1.0)
pH: 6 (ref 5.0–8.0)

## 2014-12-04 LAB — CBC WITH DIFFERENTIAL/PLATELET
Basophils Absolute: 0 10*3/uL (ref 0.0–0.1)
Basophils Relative: 0 % (ref 0–1)
EOS PCT: 0 % (ref 0–5)
Eosinophils Absolute: 0.1 10*3/uL (ref 0.0–0.7)
HEMATOCRIT: 40.8 % (ref 39.0–52.0)
Hemoglobin: 14 g/dL (ref 13.0–17.0)
Lymphocytes Relative: 11 % — ABNORMAL LOW (ref 12–46)
Lymphs Abs: 1.6 10*3/uL (ref 0.7–4.0)
MCH: 29.8 pg (ref 26.0–34.0)
MCHC: 34.3 g/dL (ref 30.0–36.0)
MCV: 86.8 fL (ref 78.0–100.0)
MONO ABS: 1 10*3/uL (ref 0.1–1.0)
MONOS PCT: 7 % (ref 3–12)
NEUTROS PCT: 82 % — AB (ref 43–77)
Neutro Abs: 11.3 10*3/uL — ABNORMAL HIGH (ref 1.7–7.7)
Platelets: 239 10*3/uL (ref 150–400)
RBC: 4.7 MIL/uL (ref 4.22–5.81)
RDW: 12.1 % (ref 11.5–15.5)
WBC: 13.8 10*3/uL — ABNORMAL HIGH (ref 4.0–10.5)

## 2014-12-04 LAB — URINE MICROSCOPIC-ADD ON

## 2014-12-04 LAB — TROPONIN I: Troponin I: <0.03 ng/mL (ref <0.031)

## 2014-12-04 LAB — LACTIC ACID, PLASMA
LACTIC ACID, VENOUS: 1.1 mmol/L (ref 0.5–2.0)
Lactic Acid, Venous: 1.3 mmol/L (ref 0.5–2.0)

## 2014-12-04 LAB — GLUCOSE, CAPILLARY: Glucose-Capillary: 292 mg/dL — ABNORMAL HIGH (ref 65–99)

## 2014-12-04 LAB — CK: CK TOTAL: 51 U/L (ref 49–397)

## 2014-12-04 LAB — LIPASE, BLOOD: LIPASE: 39 U/L (ref 22–51)

## 2014-12-04 MED ORDER — LORAZEPAM 0.5 MG PO TABS
0.5000 mg | ORAL_TABLET | Freq: Three times a day (TID) | ORAL | Status: DC
  Administered 2014-12-04 – 2014-12-05 (×3): 0.5 mg via ORAL
  Filled 2014-12-04 (×3): qty 1

## 2014-12-04 MED ORDER — INSULIN ASPART 100 UNIT/ML ~~LOC~~ SOLN: 0.0000 [IU] | Freq: Three times a day (TID) | SUBCUTANEOUS | Status: DC

## 2014-12-04 MED ORDER — LISINOPRIL 10 MG PO TABS
20.0000 mg | ORAL_TABLET | Freq: Every day | ORAL | Status: DC
  Administered 2014-12-05: 20 mg via ORAL
  Filled 2014-12-04: qty 2

## 2014-12-04 MED ORDER — ESCITALOPRAM OXALATE 10 MG PO TABS
20.0000 mg | ORAL_TABLET | Freq: Every day | ORAL | Status: DC
  Administered 2014-12-05: 20 mg via ORAL
  Filled 2014-12-04: qty 2

## 2014-12-04 MED ORDER — CLINDAMYCIN PHOSPHATE 600 MG/50ML IV SOLN
600.0000 mg | Freq: Once | INTRAVENOUS | Status: AC
  Administered 2014-12-04: 600 mg via INTRAVENOUS
  Filled 2014-12-04: qty 50

## 2014-12-04 MED ORDER — ENOXAPARIN SODIUM 40 MG/0.4ML ~~LOC~~ SOLN: 40.0000 mg | SUBCUTANEOUS | Status: DC

## 2014-12-04 MED ORDER — ONDANSETRON HCL 4 MG/2ML IJ SOLN: 4.0000 mg | Freq: Four times a day (QID) | INTRAMUSCULAR | Status: DC | PRN

## 2014-12-04 MED ORDER — PRAVASTATIN SODIUM 10 MG PO TABS
10.0000 mg | ORAL_TABLET | Freq: Every day | ORAL | Status: DC
  Administered 2014-12-04: 10 mg via ORAL
  Filled 2014-12-04: qty 1

## 2014-12-04 MED ORDER — ONDANSETRON 4 MG PO TBDP: 4.0000 mg | ORAL_TABLET | Freq: Three times a day (TID) | ORAL | Status: DC | PRN

## 2014-12-04 MED ORDER — SODIUM CHLORIDE 0.9 % IV BOLUS (SEPSIS)
2000.0000 mL | Freq: Once | INTRAVENOUS | Status: AC
  Administered 2014-12-04: 2000 mL via INTRAVENOUS

## 2014-12-04 MED ORDER — HYDROCODONE-ACETAMINOPHEN 5-325 MG PO TABS
1.0000 | ORAL_TABLET | ORAL | Status: DC | PRN
  Administered 2014-12-04 – 2014-12-05 (×5): 1 via ORAL
  Filled 2014-12-04 (×5): qty 1

## 2014-12-04 MED ORDER — INSULIN ASPART PROT & ASPART (70-30 MIX) 100 UNIT/ML ~~LOC~~ SUSP
50.0000 [IU] | Freq: Two times a day (BID) | SUBCUTANEOUS | Status: DC
  Administered 2014-12-05 (×2): 50 [IU] via SUBCUTANEOUS
  Filled 2014-12-04: qty 10

## 2014-12-04 MED ORDER — ACETAMINOPHEN 500 MG PO TABS
1000.0000 mg | ORAL_TABLET | Freq: Once | ORAL | Status: AC
  Administered 2014-12-04: 1000 mg via ORAL
  Filled 2014-12-04: qty 2

## 2014-12-04 MED ORDER — PREGABALIN 75 MG PO CAPS
200.0000 mg | ORAL_CAPSULE | Freq: Three times a day (TID) | ORAL | Status: DC
  Administered 2014-12-04 – 2014-12-05 (×3): 200 mg via ORAL
  Filled 2014-12-04 (×2): qty 2
  Filled 2014-12-04 (×2): qty 1
  Filled 2014-12-04: qty 2
  Filled 2014-12-04: qty 1

## 2014-12-04 MED ORDER — NORTRIPTYLINE HCL 25 MG PO CAPS
75.0000 mg | ORAL_CAPSULE | Freq: Three times a day (TID) | ORAL | Status: DC
  Administered 2014-12-04 – 2014-12-05 (×3): 75 mg via ORAL
  Filled 2014-12-04 (×3): qty 3

## 2014-12-04 MED ORDER — PANTOPRAZOLE SODIUM 40 MG PO TBEC: 40.0000 mg | DELAYED_RELEASE_TABLET | Freq: Every day | ORAL | Status: DC

## 2014-12-04 MED ORDER — LISINOPRIL 10 MG PO TABS: 20.0000 mg | ORAL_TABLET | Freq: Every day | ORAL | Status: DC

## 2014-12-04 MED ORDER — LURASIDONE HCL 40 MG PO TABS
40.0000 mg | ORAL_TABLET | Freq: Every day | ORAL | Status: DC
  Administered 2014-12-05: 40 mg via ORAL
  Filled 2014-12-04 (×3): qty 1

## 2014-12-04 MED ORDER — SODIUM CHLORIDE 0.9 % IJ SOLN
3.0000 mL | Freq: Two times a day (BID) | INTRAMUSCULAR | Status: DC
  Administered 2014-12-04: 3 mL via INTRAVENOUS

## 2014-12-04 MED ORDER — VITAMIN B-12 100 MCG PO TABS
100.0000 ug | ORAL_TABLET | Freq: Every day | ORAL | Status: DC
  Administered 2014-12-05: 100 ug via ORAL
  Filled 2014-12-04 (×3): qty 1

## 2014-12-04 MED ORDER — ENOXAPARIN SODIUM 40 MG/0.4ML ~~LOC~~ SOLN
40.0000 mg | SUBCUTANEOUS | Status: DC
  Administered 2014-12-04: 40 mg via SUBCUTANEOUS
  Filled 2014-12-04: qty 0.4

## 2014-12-04 MED ORDER — PANTOPRAZOLE SODIUM 40 MG PO TBEC
40.0000 mg | DELAYED_RELEASE_TABLET | Freq: Every day | ORAL | Status: DC
  Administered 2014-12-05: 40 mg via ORAL
  Filled 2014-12-04: qty 1

## 2014-12-04 MED ORDER — ONDANSETRON HCL 4 MG PO TABS: 4.0000 mg | ORAL_TABLET | Freq: Four times a day (QID) | ORAL | Status: DC | PRN

## 2014-12-04 MED ORDER — ZOLPIDEM TARTRATE 5 MG PO TABS
10.0000 mg | ORAL_TABLET | Freq: Every evening | ORAL | Status: DC | PRN
  Administered 2014-12-04: 10 mg via ORAL
  Filled 2014-12-04: qty 2

## 2014-12-04 MED ORDER — VANCOMYCIN HCL 10 G IV SOLR
1500.0000 mg | Freq: Once | INTRAVENOUS | Status: AC
  Administered 2014-12-04: 1500 mg via INTRAVENOUS
  Filled 2014-12-04: qty 1500

## 2014-12-04 MED ORDER — VITAMIN D (ERGOCALCIFEROL) 1.25 MG (50000 UNIT) PO CAPS
50000.0000 [IU] | ORAL_CAPSULE | ORAL | Status: DC
Start: 2014-12-07 — End: 2014-12-05
  Filled 2014-12-04: qty 1

## 2014-12-04 MED ORDER — METOPROLOL TARTRATE 50 MG PO TABS
100.0000 mg | ORAL_TABLET | Freq: Two times a day (BID) | ORAL | Status: DC
  Administered 2014-12-04 – 2014-12-05 (×2): 100 mg via ORAL
  Filled 2014-12-04 (×2): qty 2

## 2014-12-04 MED ORDER — METFORMIN HCL 500 MG PO TABS
1000.0000 mg | ORAL_TABLET | Freq: Two times a day (BID) | ORAL | Status: DC
  Administered 2014-12-05 (×2): 1000 mg via ORAL
  Filled 2014-12-04 (×2): qty 2

## 2014-12-04 MED ORDER — INSULIN ASPART 100 UNIT/ML ~~LOC~~ SOLN
0.0000 [IU] | Freq: Every day | SUBCUTANEOUS | Status: DC
  Administered 2014-12-04: 3 [IU] via SUBCUTANEOUS

## 2014-12-04 MED ORDER — SODIUM CHLORIDE 0.9 % IV SOLN
INTRAVENOUS | Status: AC
  Administered 2014-12-04: via INTRAVENOUS

## 2014-12-04 NOTE — H&P (Signed)
[facility] History and Physical  GERRIT RAFALSKI UUV:[id number] DOB: 1961/03/26 DOA: 12/04/2014  Referring physician: ER, Dr. Thurnell Garbe PCP: No PCP Per Patient   Chief Complaint: Pain in the left foot  HPI: [name] is a 54 y.o. male  This is a 54 year old man, diabetic, who presents with three-day history of generalized aching and pain all over associated with dizziness, and diarrhea and also fever. He also has had left foot pain. His diabetes has been out of control. He denies any nausea, abdominal pain, vomiting. He denies significant polyuria or polydipsia. Evaluation in the emergency room shows him to have cellulitis of the left foot associated with leukocytosis, fever and uncontrolled diabetes. He is now being admitted for further management.   Review of Systems:  Apart from symptoms above, all systems negative.  Past Medical History  Diagnosis Date  . Diabetes mellitus without complication   . Neuropathy   . Hypertension   . GERD (gastroesophageal reflux disease)   . Diverticulitis   . Depression   . Anxiety   . Chronic pain   . Kidney stone   . IBS (irritable bowel syndrome)   . Migraine headache   . Bipolar disorder    Past Surgical History  Procedure Laterality Date  . Carpal tunnel release    . Colonoscopy    . Cholecystectomy    . Ankle surgery Right    Social History:  reports that he has never smoked. He does not have any smokeless tobacco history on file. He reports that he uses illicit drugs (Marijuana). He reports that he does not drink alcohol.  Allergies  Allergen Reactions  . Meloxicam Other (See Comments)    Acid Reflux   . Venlafaxine Other (See Comments)    Acid Reflux  . Penicillins Rash     Family history: His mother and one of his brothers has diabetes.   Prior to Admission medications   Medication Sig Start Date End Date Taking? Authorizing Provider  escitalopram (LEXAPRO) 20 MG tablet Take 20 mg by mouth daily.   Yes  Historical Provider, MD  insulin aspart protamine- aspart (NOVOLOG MIX 70/30) (70-30) 100 UNIT/ML injection Inject 50 Units into the skin 2 (two) times daily.   Yes Historical Provider, MD  lisinopril (PRINIVIL,ZESTRIL) 20 MG tablet Take 20 mg by mouth daily.   Yes Historical Provider, MD  LORazepam (ATIVAN) 0.5 MG tablet Take 0.5 mg by mouth every 8 (eight) hours.   Yes Historical Provider, MD  lurasidone (LATUDA) 40 MG TABS tablet Take 40 mg by mouth daily with breakfast.   Yes Historical Provider, MD  metFORMIN (GLUCOPHAGE) 1000 MG tablet Take 1,000 mg by mouth 3 (three) times daily.   Yes Historical Provider, MD  metoprolol (LOPRESSOR) 100 MG tablet Take 100 mg by mouth 2 (two) times daily.   Yes Historical Provider, MD  nortriptyline (PAMELOR) 75 MG capsule Take 75 mg by mouth 3 (three) times daily.   Yes Historical Provider, MD  omeprazole (PRILOSEC) 20 MG capsule Take 20 mg by mouth daily.   Yes Historical Provider, MD  ondansetron (ZOFRAN-ODT) 4 MG disintegrating tablet Take 4 mg by mouth every 8 (eight) hours as needed for nausea or vomiting.   Yes Historical Provider, MD  pravastatin (PRAVACHOL) 10 MG tablet Take 10 mg by mouth at bedtime.    Yes Historical Provider, MD  pregabalin (LYRICA) 200 MG capsule Take 200 mg by mouth 3 (three) times daily.   Yes Historical Provider, MD  vitamin  B-12 (CYANOCOBALAMIN) 100 MCG tablet Take 100 mcg by mouth daily.   Yes Historical Provider, MD  doxycycline (VIBRAMYCIN) 100 MG capsule Take 1 capsule (100 mg total) by mouth 2 (two) times daily. 11/01/14   Lily Kocher, PA-C  HYDROcodone-acetaminophen (NORCO/VICODIN) 5-325 MG per tablet Take 1 tablet by mouth every 4 (four) hours as needed. 11/01/14   Lily Kocher, PA-C  levofloxacin (LEVAQUIN) 500 MG tablet Take 1 tablet (500 mg total) by mouth daily. 11/01/14   Lily Kocher, PA-C  oxyCODONE-acetaminophen (PERCOCET/ROXICET) 5-325 MG per tablet Take 1 tablet by mouth every 4 (four) hours as needed for  severe pain. 01/07/14   Ripley Fraise, MD  Vitamin D, Ergocalciferol, (DRISDOL) 50000 UNITS CAPS capsule Take 50,000 Units by mouth every 7 (seven) days. Usually takes on Wednesday    Historical Provider, MD  zolpidem (AMBIEN) 10 MG tablet Take 10 mg by mouth at bedtime as needed for sleep.    Historical Provider, MD   Physical Exam: Filed Vitals:   12/04/14 1521 12/04/14 1730 12/04/14 1800 12/04/14 1811  BP: 144/89 134/84 152/99   Pulse: 128 105 105   Temp: 101.7 F (38.7 C)   99.7 F (37.6 C)  TempSrc: Oral     Resp: 18 32 31   Height: 5\' 10"  (1.778 m)     Weight: 86.183 kg (190 lb)     SpO2: 99% 91% 94%     Wt Readings from Last 3 Encounters:  12/04/14 86.183 kg (190 lb)  10/31/14 82.555 kg (182 lb)  01/06/14 81.647 kg (180 lb)    General:  Appears in pain from his left foot. He is not toxic or septic clinically. Eyes: PERRL, normal lids, irises & conjunctiva ENT: grossly normal hearing, lips & tongue Neck: no LAD, masses or thyromegaly Cardiovascular: RRR, no m/r/g. No LE edema. Telemetry: SR, no arrhythmias  Respiratory: CTA bilaterally, no w/r/r. Normal respiratory effort. Abdomen: soft, ntnd Skin: He does appear to have cellulitis around the left foot area slight streaking in the medial ankle area. Musculoskeletal: grossly normal tone BUE/BLE Psychiatric: grossly normal mood and affect, speech fluent and appropriate Neurologic: grossly non-focal.          Labs on Admission:  Basic Metabolic Panel:  Recent Labs Lab 12/04/14 1647  NA 133*  K 4.0  CL 100*  CO2 23  GLUCOSE 409*  BUN 10  CREATININE 0.92  CALCIUM 8.7*   Liver Function Tests:  Recent Labs Lab 12/04/14 1647  AST 11*  ALT 19  ALKPHOS 75  BILITOT 1.7*  PROT 6.8  ALBUMIN 3.4*    Recent Labs Lab 12/04/14 1647  LIPASE 39   No results for input(s): AMMONIA in the last 168 hours. CBC:  Recent Labs Lab 12/04/14 1647  WBC 13.8*  NEUTROABS 11.3*  HGB 14.0  HCT 40.8  MCV 86.8    PLT 239   Cardiac Enzymes:  Recent Labs Lab 12/04/14 1647  CKTOTAL 51  TROPONINI <0.03    BNP (last 3 results) No results for input(s): BNP in the last 8760 hours.  ProBNP (last 3 results) No results for input(s): PROBNP in the last 8760 hours.  CBG: No results for input(s): GLUCAP in the last 168 hours.  Radiological Exams on Admission: Dg Chest 2 View  12/04/2014   CLINICAL DATA:  Fever, status post fall on Friday  EXAM: CHEST  2 VIEW  COMPARISON:  11/28/2014  FINDINGS: Lungs are clear.  No pleural effusion or pneumothorax.  The heart is normal  in size.  Mild degenerative changes of the visualized thoracolumbar spine.  IMPRESSION: No evidence of acute cardiopulmonary disease.   Electronically Signed   By: Julian Hy M.D.   On: 12/04/2014 16:45   Dg Ankle Complete Left  12/04/2014   CLINICAL DATA:  Fever, status post fall, painful/red/swollen left foot  EXAM: LEFT ANKLE COMPLETE - 3+ VIEW  COMPARISON:  None.  FINDINGS: No fracture or dislocation is seen.  The ankle mortise is intact.  Mild tibiotalar degenerative changes.  Mild soft swelling.  IMPRESSION: No fracture or dislocation is seen.   Electronically Signed   By: Julian Hy M.D.   On: 12/04/2014 16:45   Dg Foot Complete Left  12/04/2014   CLINICAL DATA:  Fever, status post fall, red/swollen/painful left foot  EXAM: LEFT FOOT - COMPLETE 3+ VIEW  COMPARISON:  None.  FINDINGS: No fracture or dislocation is seen.  Mild degenerative changes of the 1st MTP joint.  Plantar and posterior calcaneal enthesophytes.  The visualized soft tissues are unremarkable.  IMPRESSION: No acute osseus abnormality is seen.   Electronically Signed   By: Julian Hy M.D.   On: 12/04/2014 16:47      Assessment/Plan   1. Cellulitis of the left foot. This will be treated with intravenous antibiotics. 2. Diabetes with neuropathy. Continue home medications and sliding scale insulin. 3. Hypertension. Stable. Continue with home  medications.  Further recommendations will depend on patient's hospital progress.   Code Status: Full code   DVT Prophylaxis: Lovenox.  Family Communication: I discussed the plan with the patient at the bedside.   Disposition Plan: Home when medically stable.  Time spent: 45 minutes.  Doree Albee [facility] Pager [phone].

## 2014-12-04 NOTE — ED Provider Notes (Signed)
CSN: [id number]     Arrival date & time 12/04/14  1518 History   First MD Initiated Contact with Patient 12/04/14 1527     Chief Complaint  Patient presents with  . Generalized Body Aches    x2 weeks  . Diarrhea    x2 weeks  . Emesis    x2 weeks  . Fall    2 days ago      HPI Pt was seen at 1430.  Per pt, c/o gradual onset and persistence of constant left medial ankle and foot "rash" that began 2 days ago after he "fell." Pt states the area has become "more sore" and "red" and "swollen." Pt states he was evaluated at [facility] ED 2 days ago and "was told my sugar was high and that's it." Pt came to this ED today "because it's getting worse." Pt also endorses "high blood sugars" for the past 2 days. States he has been taking his insulin as prescribed. Pt also c/o gradual onset and persistence of multiple intermittent episodes of N/V/D and generalized body "aches" for the past 2 weeks. Pt states the body aches have "gotten worse" in the past 2 to 3 days. Denies abd pain, no back pain, no CP/SOB, no cough, no black or blood in stools, no focal motor weakness, no tingling/numbness in extremities.    Past Medical History  Diagnosis Date  . Diabetes mellitus without complication   . Neuropathy   . Hypertension   . GERD (gastroesophageal reflux disease)   . Diverticulitis   . Depression   . Anxiety   . Chronic pain   . Kidney stone   . IBS (irritable bowel syndrome)   . Migraine headache   . Bipolar disorder    Past Surgical History  Procedure Laterality Date  . Carpal tunnel release    . Colonoscopy    . Cholecystectomy    . Ankle surgery Right     History  Substance Use Topics  . Smoking status: Never Smoker   . Smokeless tobacco: Not on file  . Alcohol Use: No    Review of Systems ROS: Statement: All systems negative except as marked or noted in the HPI; Constitutional: +fever and chills, generalized body aches. ; ; Eyes: Negative for eye pain, redness and discharge. ; ;  ENMT: Negative for ear pain, hoarseness, nasal congestion, sinus pressure and sore throat. ; ; Cardiovascular: Negative for chest pain, palpitations, diaphoresis, dyspnea and peripheral edema. ; ; Respiratory: Negative for cough, wheezing and stridor. ; ; Gastrointestinal: +N/V/D. Negative for abdominal pain, blood in stool, hematemesis, jaundice and rectal bleeding. . ; ; Genitourinary: Negative for dysuria, flank pain and hematuria. ; ; Musculoskeletal: Negative for back pain and neck pain. Negative for deformity.; ; Skin: +left ankle rash, swelling. Negative for pruritus, abrasions, blisters, bruising and skin lesion.; ; Neuro: Negative for headache, lightheadedness and neck stiffness. Negative for weakness, altered level of consciousness , altered mental status, extremity weakness, paresthesias, involuntary movement, seizure and syncope.      Allergies  Meloxicam; Venlafaxine; and Penicillins  Home Medications   Prior to Admission medications   Medication Sig Start Date End Date Taking? Authorizing Provider  escitalopram (LEXAPRO) 20 MG tablet Take 20 mg by mouth daily.   Yes Historical Provider, MD  insulin aspart protamine- aspart (NOVOLOG MIX 70/30) (70-30) 100 UNIT/ML injection Inject 50 Units into the skin 2 (two) times daily.   Yes Historical Provider, MD  lisinopril (PRINIVIL,ZESTRIL) 20 MG tablet Take 20 mg  by mouth daily.   Yes Historical Provider, MD  LORazepam (ATIVAN) 0.5 MG tablet Take 0.5 mg by mouth every 8 (eight) hours.   Yes Historical Provider, MD  lurasidone (LATUDA) 40 MG TABS tablet Take 40 mg by mouth daily with breakfast.   Yes Historical Provider, MD  metFORMIN (GLUCOPHAGE) 1000 MG tablet Take 1,000 mg by mouth 3 (three) times daily.   Yes Historical Provider, MD  metoprolol (LOPRESSOR) 100 MG tablet Take 100 mg by mouth 2 (two) times daily.   Yes Historical Provider, MD  nortriptyline (PAMELOR) 75 MG capsule Take 75 mg by mouth 3 (three) times daily.   Yes Historical  Provider, MD  omeprazole (PRILOSEC) 20 MG capsule Take 20 mg by mouth daily.   Yes Historical Provider, MD  ondansetron (ZOFRAN-ODT) 4 MG disintegrating tablet Take 4 mg by mouth every 8 (eight) hours as needed for nausea or vomiting.   Yes Historical Provider, MD  pravastatin (PRAVACHOL) 10 MG tablet Take 10 mg by mouth at bedtime.    Yes Historical Provider, MD  pregabalin (LYRICA) 200 MG capsule Take 200 mg by mouth 3 (three) times daily.   Yes Historical Provider, MD  vitamin B-12 (CYANOCOBALAMIN) 100 MCG tablet Take 100 mcg by mouth daily.   Yes Historical Provider, MD  doxycycline (VIBRAMYCIN) 100 MG capsule Take 1 capsule (100 mg total) by mouth 2 (two) times daily. 11/01/14   Lily Kocher, PA-C  HYDROcodone-acetaminophen (NORCO/VICODIN) 5-325 MG per tablet Take 1 tablet by mouth every 4 (four) hours as needed. 11/01/14   Lily Kocher, PA-C  levofloxacin (LEVAQUIN) 500 MG tablet Take 1 tablet (500 mg total) by mouth daily. 11/01/14   Lily Kocher, PA-C  oxyCODONE-acetaminophen (PERCOCET/ROXICET) 5-325 MG per tablet Take 1 tablet by mouth every 4 (four) hours as needed for severe pain. 01/07/14   Ripley Fraise, MD  Vitamin D, Ergocalciferol, (DRISDOL) 50000 UNITS CAPS capsule Take 50,000 Units by mouth every 7 (seven) days. Usually takes on Wednesday    Historical Provider, MD  zolpidem (AMBIEN) 10 MG tablet Take 10 mg by mouth at bedtime as needed for sleep.    Historical Provider, MD   BP 152/99 mmHg  Pulse 105  Temp(Src) 99.7 F (37.6 C) (Oral)  Resp 31  Ht 5\' 10"  (1.778 m)  Wt 190 lb (86.183 kg)  BMI 27.26 kg/m2  SpO2 94% Physical Exam  1435: Physical examination:  Nursing notes reviewed; Vital signs and O2 SAT reviewed; +febrile.;; Constitutional: Well developed, Well nourished, In no acute distress; Head:  Normocephalic, atraumatic; Eyes: EOMI, PERRL, No scleral icterus; ENMT: Mouth and pharynx normal, Mucous membranes dry; Neck: Supple, Full range of motion, No lymphadenopathy;  Cardiovascular: Tachycardic rate and rhythm, No gallop; Respiratory: Breath sounds clear & equal bilaterally, No wheezes.  Speaking full sentences with ease, Intermittently hyperventilating, otherwise normal respiratory effort/excursion; Chest: Nontender, Movement normal; Abdomen: Soft, Nontender, Nondistended, Normal bowel sounds; Genitourinary: No CVA tenderness; Extremities: Pulses normal, No deformity. +left medial foot and ankle edema, tenderness, warmth and erythema, with faint streaking up medial lower leg. No open wounds, no soft tissue crepitus. Muscles compartments soft. NT foot/ankle/knee. No calf tenderness, edema or asymmetry.; Neuro: AA&Ox3, Major CN grossly intact.  Speech clear. No gross focal motor or sensory deficits in extremities.; Skin: Color normal, Warm, Dry.   ED Course  Procedures     EKG Interpretation   Date/Time:  Sunday December 04 2014 15:54:35 EDT Ventricular Rate:  115 PR Interval:  183 QRS Duration: 82 QT Interval:  325 QTC Calculation: 449 R Axis:   -14 Text Interpretation:  Sinus tachycardia Ventricular premature complex  Borderline low voltage, extremity leads Baseline wander When compared with  ECG of 01/08/2008 Rate faster Confirmed by [facility]  MD, Nunzio Cory [phone]) on  12/04/2014 4:49:25 PM      MDM  MDM Reviewed: previous chart, nursing note and vitals Reviewed previous: ECG and labs Interpretation: labs, ECG, x-ray and CT scan     Results for orders placed or performed during the hospital encounter of 12/04/14  Comprehensive metabolic panel  Result Value Ref Range   Sodium 133 (L) 135 - 145 mmol/L   Potassium 4.0 3.5 - 5.1 mmol/L   Chloride 100 (L) 101 - 111 mmol/L   CO2 23 22 - 32 mmol/L   Glucose, Bld 409 (H) 65 - 99 mg/dL   BUN 10 6 - 20 mg/dL   Creatinine, Ser 0.92 0.61 - 1.24 mg/dL   Calcium 8.7 (L) 8.9 - 10.3 mg/dL   Total Protein 6.8 6.5 - 8.1 g/dL   Albumin 3.4 (L) 3.5 - 5.0 g/dL   AST 11 (L) 15 - 41 U/L   ALT 19 17 - 63 U/L    Alkaline Phosphatase 75 38 - 126 U/L   Total Bilirubin 1.7 (H) 0.3 - 1.2 mg/dL   GFR calc non Af Amer >60 >60 mL/min   GFR calc Af Amer >60 >60 mL/min   Anion gap 10 5 - 15  Lipase, blood  Result Value Ref Range   Lipase 39 22 - 51 U/L  Lactic acid, plasma  Result Value Ref Range   Lactic Acid, Venous 1.3 0.5 - 2.0 mmol/L  CBC with Differential  Result Value Ref Range   WBC 13.8 (H) 4.0 - 10.5 K/uL   RBC 4.70 4.22 - 5.81 MIL/uL   Hemoglobin 14.0 13.0 - 17.0 g/dL   HCT 40.8 39.0 - 52.0 %   MCV 86.8 78.0 - 100.0 fL   MCH 29.8 26.0 - 34.0 pg   MCHC 34.3 30.0 - 36.0 g/dL   RDW 12.1 11.5 - 15.5 %   Platelets 239 150 - 400 K/uL   Neutrophils Relative % 82 (H) 43 - 77 %   Neutro Abs 11.3 (H) 1.7 - 7.7 K/uL   Lymphocytes Relative 11 (L) 12 - 46 %   Lymphs Abs 1.6 0.7 - 4.0 K/uL   Monocytes Relative 7 3 - 12 %   Monocytes Absolute 1.0 0.1 - 1.0 K/uL   Eosinophils Relative 0 0 - 5 %   Eosinophils Absolute 0.1 0.0 - 0.7 K/uL   Basophils Relative 0 0 - 1 %   Basophils Absolute 0.0 0.0 - 0.1 K/uL  CK  Result Value Ref Range   Total CK 51 49 - 397 U/L  Troponin I  Result Value Ref Range   Troponin I <0.03 <0.031 ng/mL  Urinalysis, Routine w reflex microscopic (not at [facility])  Result Value Ref Range   Color, Urine YELLOW YELLOW   APPearance CLEAR CLEAR   Specific Gravity, Urine <1.005 (L) 1.005 - 1.030   pH 6.0 5.0 - 8.0   Glucose, UA >1000 (A) NEGATIVE mg/dL   Hgb urine dipstick NEGATIVE NEGATIVE   Bilirubin Urine NEGATIVE NEGATIVE   Ketones, ur 40 (A) NEGATIVE mg/dL   Protein, ur NEGATIVE NEGATIVE mg/dL   Urobilinogen, UA 0.2 0.0 - 1.0 mg/dL   Nitrite NEGATIVE NEGATIVE   Leukocytes, UA NEGATIVE NEGATIVE  Urine microscopic-add on  Result Value Ref Range  Squamous Epithelial / LPF RARE RARE   WBC, UA 0-2 <3 WBC/hpf   RBC / HPF 0-2 <3 RBC/hpf   Bacteria, UA RARE RARE   Dg Chest 2 View 12/04/2014   CLINICAL DATA:  Fever, status post fall on Friday  EXAM: CHEST  2 VIEW   COMPARISON:  11/28/2014  FINDINGS: Lungs are clear.  No pleural effusion or pneumothorax.  The heart is normal in size.  Mild degenerative changes of the visualized thoracolumbar spine.  IMPRESSION: No evidence of acute cardiopulmonary disease.   Electronically Signed   By: Julian Hy M.D.   On: 12/04/2014 16:45   Dg Ankle Complete Left 12/04/2014   CLINICAL DATA:  Fever, status post fall, painful/red/swollen left foot  EXAM: LEFT ANKLE COMPLETE - 3+ VIEW  COMPARISON:  None.  FINDINGS: No fracture or dislocation is seen.  The ankle mortise is intact.  Mild tibiotalar degenerative changes.  Mild soft swelling.  IMPRESSION: No fracture or dislocation is seen.   Electronically Signed   By: Julian Hy M.D.   On: 12/04/2014 16:45   Dg Foot Complete Left 12/04/2014   CLINICAL DATA:  Fever, status post fall, red/swollen/painful left foot  EXAM: LEFT FOOT - COMPLETE 3+ VIEW  COMPARISON:  None.  FINDINGS: No fracture or dislocation is seen.  Mild degenerative changes of the 1st MTP joint.  Plantar and posterior calcaneal enthesophytes.  The visualized soft tissues are unremarkable.  IMPRESSION: No acute osseus abnormality is seen.   Electronically Signed   By: Julian Hy M.D.   On: 12/04/2014 16:47    1815:  Elevated CBG, not acidotic; will dose IVF. Pt states his LD insulin 70/30 was approximately 11am PTA. APAP given for fever with improvement. IV abx started for cellulitis after BCx2 obtained. Pt has tol PO well while in the ED without N/V. No stooling while in the ED. Abd remains benign. Given this is pt's 2nd ED visit for same complaint (rash on ankle/foot), will observation admit.  Dx and testing d/w pt.  Questions answered.  Verb understanding, agreeable to observation admit.  T/C to [facility] Dr. Anastasio Champion, case discussed, including:  HPI, pertinent PM/SHx, VS/PE, dx testing, ED course and treatment:  Agreeable to admit, requests to write temporary orders, obtain observation medical bed to team  APAdmits.    Francine Graven, DO 12/06/14 2220

## 2014-12-04 NOTE — ED Notes (Signed)
Pt states that he has been having dizziness, diarrhea, and generalized pain for 3 days.  States he fell 2 days ago and hurt his left foot.  States "everything is wrong with me".

## 2014-12-04 NOTE — Progress Notes (Signed)
ANTIBIOTIC CONSULT NOTE - INITIAL  Pharmacy Consult for Vancomycin Indication: cellulitis  Allergies  Allergen Reactions  . Meloxicam Other (See Comments)    Acid Reflux   . Venlafaxine Other (See Comments)    Acid Reflux  . Penicillins Rash    Patient Measurements: Height: 5\' 10"  (177.8 cm) Weight: 190 lb (86.183 kg) IBW/kg (Calculated) : 73  Vital Signs: Temp: 99.7 F (37.6 C) (07/17 1811) Temp Source: Oral (07/17 1521) BP: 149/89 mmHg (07/17 1930) Pulse Rate: 88 (07/17 1930) Intake/Output from previous day:   Intake/Output from this shift:    Labs:  Recent Labs  12/04/14 1647  WBC 13.8*  HGB 14.0  PLT 239  CREATININE 0.92   Estimated Creatinine Clearance: 92.6 mL/min (by C-G formula based on Cr of 0.92). No results for input(s): VANCOTROUGH, VANCOPEAK, VANCORANDOM, GENTTROUGH, GENTPEAK, GENTRANDOM, TOBRATROUGH, TOBRAPEAK, TOBRARND, AMIKACINPEAK, AMIKACINTROU, AMIKACIN in the last 72 hours.   Microbiology: Recent Results (from the past 720 hour(s))  Blood culture (routine x 2)     Status: None (Preliminary result)   Collection Time: 12/04/14  6:22 PM  Result Value Ref Range Status   Specimen Description RIGHT ANTECUBITAL  Final   Special Requests BOTTLES DRAWN AEROBIC AND ANAEROBIC 6CC  Final   Culture PENDING  Incomplete   Report Status PENDING  Incomplete  Blood culture (routine x 2)     Status: None (Preliminary result)   Collection Time: 12/04/14  6:28 PM  Result Value Ref Range Status   Specimen Description BLOOD RIGHT ARM  Final   Special Requests BOTTLES DRAWN AEROBIC AND ANAEROBIC 6CC  Final   Culture PENDING  Incomplete   Report Status PENDING  Incomplete    Medical History: Past Medical History  Diagnosis Date  . Diabetes mellitus without complication   . Neuropathy   . Hypertension   . GERD (gastroesophageal reflux disease)   . Diverticulitis   . Depression   . Anxiety   . Chronic pain   . Kidney stone   . IBS (irritable bowel  syndrome)   . Migraine headache   . Bipolar disorder    Anti-infectives    Start     Dose/Rate Route Frequency Ordered Stop   12/04/14 2100  vancomycin (VANCOCIN) 1,500 mg in sodium chloride 0.9 % 500 mL IVPB     1,500 mg 250 mL/hr over 120 Minutes Intravenous  Once 12/04/14 2023     12/04/14 1815  clindamycin (CLEOCIN) IVPB 600 mg     600 mg 100 mL/hr over 30 Minutes Intravenous  Once 12/04/14 1810 12/04/14 1910     Assessment: 54yo diabetic male presents with cellulitis of left foot.  Asked to initiate Vancomycin.  Pt has good renal fxn.  Goal of Therapy:  Vancomycin trough level 10-15 mcg/ml  Plan:  Vancomycin 1500mg  IV now x 1 then Vancomycin 1000mg  IV q12hrs Check trough at steady state Monitor labs, renal fxn, and c/s  Hart Robinsons A 12/04/2014,8:27 PM

## 2014-12-05 DIAGNOSIS — L03116 Cellulitis of left lower limb: Secondary | ICD-10-CM

## 2014-12-05 DIAGNOSIS — E1140 Type 2 diabetes mellitus with diabetic neuropathy, unspecified: Secondary | ICD-10-CM

## 2014-12-05 LAB — COMPREHENSIVE METABOLIC PANEL
ALK PHOS: 85 U/L (ref 38–126)
ALT: 33 U/L (ref 17–63)
AST: 88 U/L — AB (ref 15–41)
Albumin: 2.8 g/dL — ABNORMAL LOW (ref 3.5–5.0)
Anion gap: 7 (ref 5–15)
BUN: 8 mg/dL (ref 6–20)
CO2: 24 mmol/L (ref 22–32)
Calcium: 7.9 mg/dL — ABNORMAL LOW (ref 8.9–10.3)
Chloride: 104 mmol/L (ref 101–111)
Creatinine, Ser: 0.6 mg/dL — ABNORMAL LOW (ref 0.61–1.24)
GFR calc non Af Amer: >60 mL/min (ref >60)
Glucose, Bld: 372 mg/dL — ABNORMAL HIGH (ref 65–99)
Potassium: 3.4 mmol/L — ABNORMAL LOW (ref 3.5–5.1)
Sodium: 135 mmol/L (ref 135–145)
Total Bilirubin: 1.6 mg/dL — ABNORMAL HIGH (ref 0.3–1.2)
Total Protein: 6 g/dL — ABNORMAL LOW (ref 6.5–8.1)

## 2014-12-05 LAB — CBC
HCT: 37.6 % — ABNORMAL LOW (ref 39.0–52.0)
HEMOGLOBIN: 12.6 g/dL — AB (ref 13.0–17.0)
MCH: 29.6 pg (ref 26.0–34.0)
MCHC: 33.5 g/dL (ref 30.0–36.0)
MCV: 88.5 fL (ref 78.0–100.0)
Platelets: 215 10*3/uL (ref 150–400)
RBC: 4.25 MIL/uL (ref 4.22–5.81)
RDW: 12.4 % (ref 11.5–15.5)
WBC: 10.3 10*3/uL (ref 4.0–10.5)

## 2014-12-05 LAB — GLUCOSE, CAPILLARY
GLUCOSE-CAPILLARY: 170 mg/dL — AB (ref 65–99)
GLUCOSE-CAPILLARY: 150 mg/dL — AB (ref 65–99)
Glucose-Capillary: 387 mg/dL — ABNORMAL HIGH (ref 65–99)
Glucose-Capillary: 78 mg/dL (ref 65–99)

## 2014-12-05 MED ORDER — VANCOMYCIN HCL IN DEXTROSE 1-5 GM/200ML-% IV SOLN
1000.0000 mg | Freq: Two times a day (BID) | INTRAVENOUS | Status: DC
  Filled 2014-12-05 (×3): qty 200

## 2014-12-05 MED ORDER — INSULIN ASPART 100 UNIT/ML ~~LOC~~ SOLN: 0.0000 [IU] | Freq: Every day | SUBCUTANEOUS | Status: DC

## 2014-12-05 MED ORDER — DOXYCYCLINE HYCLATE 100 MG PO CAPS
100.0000 mg | ORAL_CAPSULE | Freq: Two times a day (BID) | ORAL | Status: DC
Start: 2014-12-05 — End: 2017-10-05

## 2014-12-05 MED ORDER — OXYCODONE-ACETAMINOPHEN 5-325 MG PO TABS: 1.0000 | ORAL_TABLET | ORAL | Status: DC | PRN

## 2014-12-05 MED ORDER — INSULIN ASPART 100 UNIT/ML ~~LOC~~ SOLN
0.0000 [IU] | Freq: Three times a day (TID) | SUBCUTANEOUS | Status: DC
  Administered 2014-12-05: 20 [IU] via SUBCUTANEOUS
  Administered 2014-12-05: 3 [IU] via SUBCUTANEOUS

## 2014-12-05 NOTE — Progress Notes (Signed)
Inpatient Diabetes Program Recommendations  AACE/ADA: New Consensus Statement on Inpatient Glycemic Control (2013)  Target Ranges:  Prepandial:   less than 140 mg/dL      Peak postprandial:   less than 180 mg/dL (1-2 hours)      Critically ill patients:  140 - 180 mg/dL   Results for [name], [name] (MRN [id number]) as of 12/05/2014 10:28  Ref. Range 12/04/2014 20:33 12/05/2014 07:44  Glucose-Capillary Latest Ref Range: 65-99 mg/dL 292 (H) 387 (H)    Diabetes history: DM2 Outpatient Diabetes medications: 70-30 50 units BID, Metformin 1000 mg BID Current orders for Inpatient glycemic control: 70/30 50 units BID, Novolog 0-20 units TID with meals, Novolog 0-5 units HS, Metformin 1000 mg BID  Inpatient Diabetes Program Recommendations HgbA1C: Please consider ordering an A1C to evaluate glycemic control over the past 2-3 months.  Thanks, Barnie Alderman, RN, MSN, CCRN, CDE Diabetes Coordinator Inpatient Diabetes Program [phone] (Team Pager from [place] to [place]) [phone] ([facility] office) [phone] [facility] office) [phone] [facility] office)

## 2014-12-05 NOTE — Progress Notes (Signed)
Pt is non-compliant with drinking diet sodas. Educated and unsuccessful.

## 2014-12-05 NOTE — Discharge Summary (Addendum)
Physician Discharge Summary  [name]:[id number] DOB: 1960/11/05 DOA: 12/04/2014  PCP: No PCP Per Patient  Admit date: 12/04/2014 Discharge date: 12/05/2014  Time spent: > 35 minutes  Recommendations for Outpatient Follow-up:  1. [facility]   Discharge Diagnoses:  Active Problems:   Fever   Cellulitis of left foot   Diabetes mellitus with neuropathy   Hypertension  Discharge Condition: stable  Diet recommendation: diabetic  Filed Weights   12/04/14 1521 12/05/14 0625  Weight: 86.183 kg (190 lb) 86.183 kg (190 lb)   History of present illness:  [name] is a 54 y.o. male  This is a 54 year old man, diabetic, who presents with three-day history of generalized aching and pain all over associated with dizziness, and diarrhea and also fever. He also has had left foot pain. His diabetes has been out of control. He denies any nausea, abdominal pain, vomiting. He denies significant polyuria or polydipsia. Evaluation in the emergency room shows him to have cellulitis of the left foot associated with leukocytosis, fever and uncontrolled diabetes. He is now being admitted for further management.  Hospital Course:  Patient was admitted to the hospital with foot cellulitis likely due to underlying poorly controlled diabetes, and placed on IV Vancomycin. His cellulitis has improved significantly with just 1 days of IV antibiotics. He is afebrile, stable, and asking to go home. His antibiotics were changed to doxycycline and will need 7 additional days. He is being followed by [facility] and has an endocrinologist there, he will need close follow up as his DM is poorly controlled. He was discharged home in stable condition.  Procedures:  None    Consultations:  None   Discharge Exam: Filed Vitals:   12/04/14 1900 12/04/14 1930 12/04/14 2031 12/05/14 0625  BP: 156/99 149/89 141/69 144/89  Pulse: 101 88 90 80  Temp:    98 F (36.7 C)  TempSrc:   Oral Oral   Resp: 22  20 20   Height:    5\' 10"  (1.778 m)  Weight:    86.183 kg (190 lb)  SpO2: 96% 93% 97% 98%    General: NAD Cardiovascular: RRR Respiratory: CTA biL  Discharge Instructions     Medication List    STOP taking these medications        HYDROcodone-acetaminophen 5-325 MG per tablet  Commonly known as:  NORCO/VICODIN     levofloxacin 500 MG tablet  Commonly known as:  LEVAQUIN      TAKE these medications        doxycycline 100 MG capsule  Commonly known as:  VIBRAMYCIN  Take 1 capsule (100 mg total) by mouth 2 (two) times daily.     escitalopram 20 MG tablet  Commonly known as:  LEXAPRO  Take 20 mg by mouth daily.     insulin aspart protamine- aspart (70-30) 100 UNIT/ML injection  Commonly known as:  NOVOLOG MIX 70/30  Inject 50 Units into the skin 2 (two) times daily.     lisinopril 20 MG tablet  Commonly known as:  PRINIVIL,ZESTRIL  Take 20 mg by mouth daily.     LORazepam 0.5 MG tablet  Commonly known as:  ATIVAN  Take 0.5 mg by mouth every 8 (eight) hours.     lurasidone 40 MG Tabs tablet  Commonly known as:  LATUDA  Take 40 mg by mouth daily with breakfast.     metFORMIN 1000 MG tablet  Commonly known as:  GLUCOPHAGE  Take 1,000 mg by mouth 3 (three) times daily.     metoprolol 100 MG tablet  Commonly known as:  LOPRESSOR  Take 100 mg by mouth 2 (two) times daily.     nortriptyline 75 MG capsule  Commonly known as:  PAMELOR  Take 75 mg by mouth 3 (three) times daily.     omeprazole 20 MG capsule  Commonly known as:  PRILOSEC  Take 20 mg by mouth daily.     ondansetron 4 MG disintegrating tablet  Commonly known as:  ZOFRAN-ODT  Take 4 mg by mouth every 8 (eight) hours as needed for nausea or vomiting.     oxyCODONE-acetaminophen 5-325 MG per tablet  Commonly known as:  PERCOCET/ROXICET  Take 1 tablet by mouth every 4 (four) hours as needed for severe pain.     pravastatin 10 MG tablet  Commonly known as:  PRAVACHOL  Take 10 mg by  mouth at bedtime.     pregabalin 200 MG capsule  Commonly known as:  LYRICA  Take 200 mg by mouth 3 (three) times daily.     vitamin B-12 100 MCG tablet  Commonly known as:  CYANOCOBALAMIN  Take 100 mcg by mouth daily.     Vitamin D (Ergocalciferol) 50000 UNITS Caps capsule  Commonly known as:  DRISDOL  Take 50,000 Units by mouth every 7 (seven) days. Usually takes on Wednesday     zolpidem 10 MG tablet  Commonly known as:  AMBIEN  Take 10 mg by mouth at bedtime as needed for sleep.           Follow-up Information    Follow up with PCP In 1 week.      The results of significant diagnostics from this hospitalization (including imaging, microbiology, ancillary and laboratory) are listed below for reference.    Significant Diagnostic Studies: Dg Chest 2 View  12/04/2014   CLINICAL DATA:  Fever, status post fall on Friday  EXAM: CHEST  2 VIEW  COMPARISON:  11/28/2014  FINDINGS: Lungs are clear.  No pleural effusion or pneumothorax.  The heart is normal in size.  Mild degenerative changes of the visualized thoracolumbar spine.  IMPRESSION: No evidence of acute cardiopulmonary disease.   Electronically Signed   By: Julian Hy M.D.   On: 12/04/2014 16:45   Dg Ankle Complete Left  12/04/2014   CLINICAL DATA:  Fever, status post fall, painful/red/swollen left foot  EXAM: LEFT ANKLE COMPLETE - 3+ VIEW  COMPARISON:  None.  FINDINGS: No fracture or dislocation is seen.  The ankle mortise is intact.  Mild tibiotalar degenerative changes.  Mild soft swelling.  IMPRESSION: No fracture or dislocation is seen.   Electronically Signed   By: Julian Hy M.D.   On: 12/04/2014 16:45   Dg Foot Complete Left  12/04/2014   CLINICAL DATA:  Fever, status post fall, red/swollen/painful left foot  EXAM: LEFT FOOT - COMPLETE 3+ VIEW  COMPARISON:  None.  FINDINGS: No fracture or dislocation is seen.  Mild degenerative changes of the 1st MTP joint.  Plantar and posterior calcaneal enthesophytes.   The visualized soft tissues are unremarkable.  IMPRESSION: No acute osseus abnormality is seen.   Electronically Signed   By: Julian Hy M.D.   On: 12/04/2014 16:47    Microbiology: Recent Results (from the past 240 hour(s))  Urine culture     Status: None (Preliminary result)   Collection Time: 12/04/14  3:51 PM  Result Value Ref Range Status   Specimen  Description URINE, CLEAN CATCH  Final   Special Requests NONE  Final   Culture   Final    NO GROWTH < 24 HOURS Performed at [facility]    Report Status PENDING  Incomplete  Blood culture (routine x 2)     Status: None (Preliminary result)   Collection Time: 12/04/14  6:22 PM  Result Value Ref Range Status   Specimen Description RIGHT ANTECUBITAL  Final   Special Requests BOTTLES DRAWN AEROBIC AND ANAEROBIC 6CC  Final   Culture NO GROWTH < 24 HOURS  Final   Report Status PENDING  Incomplete  Blood culture (routine x 2)     Status: None (Preliminary result)   Collection Time: 12/04/14  6:28 PM  Result Value Ref Range Status   Specimen Description BLOOD RIGHT ARM  Final   Special Requests BOTTLES DRAWN AEROBIC AND ANAEROBIC 6CC  Final   Culture NO GROWTH < 24 HOURS  Final   Report Status PENDING  Incomplete     Labs: Basic Metabolic Panel:  Recent Labs Lab 12/04/14 1647 12/05/14 0448  NA 133* 135  K 4.0 3.4*  CL 100* 104  CO2 23 24  GLUCOSE 409* 372*  BUN 10 8  CREATININE 0.92 0.60*  CALCIUM 8.7* 7.9*   Liver Function Tests:  Recent Labs Lab 12/04/14 1647 12/05/14 0448  AST 11* 88*  ALT 19 33  ALKPHOS 75 85  BILITOT 1.7* 1.6*  PROT 6.8 6.0*  ALBUMIN 3.4* 2.8*    Recent Labs Lab 12/04/14 1647  LIPASE 39   CBC:  Recent Labs Lab 12/04/14 1647 12/05/14 0448  WBC 13.8* 10.3  NEUTROABS 11.3*  --   HGB 14.0 12.6*  HCT 40.8 37.6*  MCV 86.8 88.5  PLT 239 215   Cardiac Enzymes:  Recent Labs Lab 12/04/14 1647  CKTOTAL 51  TROPONINI <0.03    CBG:  Recent Labs Lab  12/04/14 2033 12/05/14 0744 12/05/14 1135 12/05/14 1657  GLUCAP 292* 387* 170* 78       Signed:  Nyiah Pianka  [facility] 12/05/2014, 6:11 PM

## 2014-12-05 NOTE — Care Management Note (Signed)
Case Management Note  Patient Details  Name: [name] MRN: [id number] Date of Birth: [birth year]  Subjective/Objective:                  Pt admitted from home with cellulitis. Pt lives alone and will return home at discharge. Pt is independent with ADL's. Pt is [facility].  Action/Plan: Pt for discharge today. No CM needs noted.  Expected Discharge Date:  12/07/14               Expected Discharge Plan:  Home/Self Care  In-House Referral:  NA  Discharge planning Services  CM Consult  Post Acute Care Choice:  NA Choice offered to:  NA  DME Arranged:    DME Agency:     HH Arranged:    HH Agency:     Status of Service:  Completed, signed off  Medicare Important Message Given:    Date Medicare IM Given:    Medicare IM give by:    Date Additional Medicare IM Given:    Additional Medicare Important Message give by:     If discussed at [place] of Stay Meetings, dates discussed:    Additional Comments:  Joylene Draft, RN 12/05/2014, 1:19 PM

## 2014-12-05 NOTE — Discharge Instructions (Signed)
Follow with primary MD in 5-7 days ° °Please get a complete blood count and chemistry panel checked by your Primary MD at your next visit, and again as instructed by your Primary MD. Please get your medications reviewed and adjusted by your Primary MD. ° °Please request your Primary MD to go over all Hospital Tests and Procedure/Radiological results at the follow up, please get all Hospital records sent to your Prim MD by signing hospital release before you go home. ° °If you had Pneumonia of Lung problems at the Hospital: °Please get a 2 view Chest X ray done in 6-8 weeks after hospital discharge or sooner if instructed by your Primary MD. ° °If you have Congestive Heart Failure: °Please call your Cardiologist or Primary MD anytime you have any of the following symptoms:  °1) 3 pound weight gain in 24 hours or 5 pounds in 1 week  °2) shortness of breath, with or without a dry hacking cough  °3) swelling in the hands, feet or stomach  °4) if you have to sleep on extra pillows at night in order to breathe ° °Follow cardiac low salt diet and 1.5 lit/day fluid restriction. ° °If you have diabetes °Accuchecks 4 times/day, Once in AM empty stomach and then before each meal. °Log in all results and show them to your primary doctor at your next visit. °If any glucose reading is under 80 or above 300 call your primary MD immediately. ° °If you have Seizure/Convulsions/Epilepsy: °Please do not drive, operate heavy machinery, participate in activities at heights or participate in high speed sports until you have seen by Primary MD or a Neurologist and advised to do so again. ° °If you had Gastrointestinal Bleeding: °Please ask your Primary MD to check a complete blood count within one week of discharge or at your next visit. Your endoscopic/colonoscopic biopsies that are pending at the time of discharge, will also need to followed by your Primary MD. ° °Get Medicines reviewed and adjusted. °Please take all your medications  with you for your next visit with your Primary MD ° °Please request your Primary MD to go over all hospital tests and procedure/radiological results at the follow up, please ask your Primary MD to get all Hospital records sent to his/her office. ° °If you experience worsening of your admission symptoms, develop shortness of breath, life threatening emergency, suicidal or homicidal thoughts you must seek medical attention immediately by calling 911 or calling your MD immediately  if symptoms less severe. ° °You must read complete instructions/literature along with all the possible adverse reactions/side effects for all the Medicines you take and that have been prescribed to you. Take any new Medicines after you have completely understood and accpet all the possible adverse reactions/side effects.  ° °Do not drive or operate heavy machinery when taking Pain medications.  ° °Do not take more than prescribed Pain, Sleep and Anxiety Medications ° °Special Instructions: If you have smoked or chewed Tobacco  in the last 2 yrs please stop smoking, stop any regular Alcohol  and or any Recreational drug use. ° °Wear Seat belts while driving. ° °Please note °You were cared for by a hospitalist during your hospital stay. If you have any questions about your discharge medications or the care you received while you were in the hospital after you are discharged, you can call the unit and asked to speak with the hospitalist on call if the hospitalist that took care of you is not available. Once you   are discharged, your primary care physician will handle any further medical issues. Please note that NO REFILLS for any discharge medications will be authorized once you are discharged, as it is imperative that you return to your primary care physician (or establish a relationship with a primary care physician if you do not have one) for your aftercare needs so that they can reassess your need for medications and monitor your lab  values.  You can reach the hospitalist office at phone [phone] or fax [phone]   If you do not have a primary care physician, you can call [phone] for a physician referral.  Activity: As tolerated with Full fall precautions use walker/cane & assistance as needed  Diet: diabetic  Disposition Home

## 2014-12-06 LAB — URINE CULTURE: Culture: NO GROWTH

## 2014-12-09 LAB — CULTURE, BLOOD (ROUTINE X 2)
CULTURE: NO GROWTH
CULTURE: NO GROWTH

## 2016-04-05 DIAGNOSIS — R112 Nausea with vomiting, unspecified: Secondary | ICD-10-CM | POA: Diagnosis not present

## 2016-04-05 DIAGNOSIS — E1165 Type 2 diabetes mellitus with hyperglycemia: Secondary | ICD-10-CM | POA: Diagnosis not present

## 2016-04-05 DIAGNOSIS — E11610 Type 2 diabetes mellitus with diabetic neuropathic arthropathy: Secondary | ICD-10-CM | POA: Diagnosis not present

## 2016-04-05 DIAGNOSIS — I10 Essential (primary) hypertension: Secondary | ICD-10-CM | POA: Diagnosis not present

## 2016-04-05 DIAGNOSIS — Z7984 Long term (current) use of oral hypoglycemic drugs: Secondary | ICD-10-CM | POA: Diagnosis not present

## 2016-04-05 DIAGNOSIS — L97519 Non-pressure chronic ulcer of other part of right foot with unspecified severity: Secondary | ICD-10-CM | POA: Diagnosis not present

## 2016-04-05 DIAGNOSIS — F319 Bipolar disorder, unspecified: Secondary | ICD-10-CM | POA: Diagnosis not present

## 2016-04-05 DIAGNOSIS — K219 Gastro-esophageal reflux disease without esophagitis: Secondary | ICD-10-CM | POA: Diagnosis not present

## 2016-04-05 DIAGNOSIS — R197 Diarrhea, unspecified: Secondary | ICD-10-CM | POA: Diagnosis not present

## 2016-04-05 DIAGNOSIS — Z79899 Other long term (current) drug therapy: Secondary | ICD-10-CM | POA: Diagnosis not present

## 2016-04-05 DIAGNOSIS — Z794 Long term (current) use of insulin: Secondary | ICD-10-CM | POA: Diagnosis not present

## 2016-04-24 DIAGNOSIS — I10 Essential (primary) hypertension: Secondary | ICD-10-CM | POA: Diagnosis not present

## 2016-04-24 DIAGNOSIS — R112 Nausea with vomiting, unspecified: Secondary | ICD-10-CM | POA: Diagnosis not present

## 2016-04-24 DIAGNOSIS — E1142 Type 2 diabetes mellitus with diabetic polyneuropathy: Secondary | ICD-10-CM | POA: Diagnosis not present

## 2016-04-24 DIAGNOSIS — R197 Diarrhea, unspecified: Secondary | ICD-10-CM | POA: Diagnosis not present

## 2016-04-24 DIAGNOSIS — F319 Bipolar disorder, unspecified: Secondary | ICD-10-CM | POA: Diagnosis not present

## 2016-04-24 DIAGNOSIS — K219 Gastro-esophageal reflux disease without esophagitis: Secondary | ICD-10-CM | POA: Diagnosis not present

## 2016-04-24 DIAGNOSIS — E1143 Type 2 diabetes mellitus with diabetic autonomic (poly)neuropathy: Secondary | ICD-10-CM | POA: Diagnosis not present

## 2016-04-24 DIAGNOSIS — K3184 Gastroparesis: Secondary | ICD-10-CM | POA: Diagnosis not present

## 2016-04-24 DIAGNOSIS — Z79899 Other long term (current) drug therapy: Secondary | ICD-10-CM | POA: Diagnosis not present

## 2016-04-24 DIAGNOSIS — Z794 Long term (current) use of insulin: Secondary | ICD-10-CM | POA: Diagnosis not present

## 2016-05-01 DIAGNOSIS — S99921A Unspecified injury of right foot, initial encounter: Secondary | ICD-10-CM | POA: Diagnosis not present

## 2016-05-01 DIAGNOSIS — M545 Low back pain: Secondary | ICD-10-CM | POA: Diagnosis not present

## 2016-05-01 DIAGNOSIS — K3184 Gastroparesis: Secondary | ICD-10-CM | POA: Diagnosis not present

## 2016-05-01 DIAGNOSIS — M79671 Pain in right foot: Secondary | ICD-10-CM | POA: Diagnosis not present

## 2016-05-01 DIAGNOSIS — E11621 Type 2 diabetes mellitus with foot ulcer: Secondary | ICD-10-CM | POA: Diagnosis not present

## 2016-05-01 DIAGNOSIS — Z79899 Other long term (current) drug therapy: Secondary | ICD-10-CM | POA: Diagnosis not present

## 2016-05-01 DIAGNOSIS — L97419 Non-pressure chronic ulcer of right heel and midfoot with unspecified severity: Secondary | ICD-10-CM | POA: Diagnosis not present

## 2016-05-01 DIAGNOSIS — Z8249 Family history of ischemic heart disease and other diseases of the circulatory system: Secondary | ICD-10-CM | POA: Diagnosis not present

## 2016-05-01 DIAGNOSIS — Z833 Family history of diabetes mellitus: Secondary | ICD-10-CM | POA: Diagnosis not present

## 2016-05-01 DIAGNOSIS — E1142 Type 2 diabetes mellitus with diabetic polyneuropathy: Secondary | ICD-10-CM | POA: Diagnosis not present

## 2016-05-01 DIAGNOSIS — E1143 Type 2 diabetes mellitus with diabetic autonomic (poly)neuropathy: Secondary | ICD-10-CM | POA: Diagnosis not present

## 2016-05-01 DIAGNOSIS — Z794 Long term (current) use of insulin: Secondary | ICD-10-CM | POA: Diagnosis not present

## 2016-05-01 DIAGNOSIS — F329 Major depressive disorder, single episode, unspecified: Secondary | ICD-10-CM | POA: Diagnosis not present

## 2016-05-01 DIAGNOSIS — E11319 Type 2 diabetes mellitus with unspecified diabetic retinopathy without macular edema: Secondary | ICD-10-CM | POA: Diagnosis not present

## 2016-05-01 DIAGNOSIS — I10 Essential (primary) hypertension: Secondary | ICD-10-CM | POA: Diagnosis not present

## 2016-05-05 DIAGNOSIS — F319 Bipolar disorder, unspecified: Secondary | ICD-10-CM | POA: Diagnosis not present

## 2016-05-05 DIAGNOSIS — K219 Gastro-esophageal reflux disease without esophagitis: Secondary | ICD-10-CM | POA: Diagnosis not present

## 2016-05-05 DIAGNOSIS — M14671 Charcot's joint, right ankle and foot: Secondary | ICD-10-CM | POA: Diagnosis not present

## 2016-05-05 DIAGNOSIS — M869 Osteomyelitis, unspecified: Secondary | ICD-10-CM | POA: Diagnosis not present

## 2016-05-05 DIAGNOSIS — Z79899 Other long term (current) drug therapy: Secondary | ICD-10-CM | POA: Diagnosis not present

## 2016-05-05 DIAGNOSIS — I10 Essential (primary) hypertension: Secondary | ICD-10-CM | POA: Diagnosis not present

## 2016-05-05 DIAGNOSIS — Z7984 Long term (current) use of oral hypoglycemic drugs: Secondary | ICD-10-CM | POA: Diagnosis not present

## 2016-05-05 DIAGNOSIS — E1169 Type 2 diabetes mellitus with other specified complication: Secondary | ICD-10-CM | POA: Diagnosis not present

## 2016-05-05 DIAGNOSIS — E1142 Type 2 diabetes mellitus with diabetic polyneuropathy: Secondary | ICD-10-CM | POA: Diagnosis not present

## 2016-05-05 DIAGNOSIS — Z794 Long term (current) use of insulin: Secondary | ICD-10-CM | POA: Diagnosis not present

## 2016-06-01 DIAGNOSIS — E1143 Type 2 diabetes mellitus with diabetic autonomic (poly)neuropathy: Secondary | ICD-10-CM | POA: Diagnosis not present

## 2016-06-01 DIAGNOSIS — A084 Viral intestinal infection, unspecified: Secondary | ICD-10-CM | POA: Diagnosis not present

## 2016-06-01 DIAGNOSIS — I10 Essential (primary) hypertension: Secondary | ICD-10-CM | POA: Diagnosis not present

## 2016-06-01 DIAGNOSIS — R112 Nausea with vomiting, unspecified: Secondary | ICD-10-CM | POA: Diagnosis not present

## 2016-06-01 DIAGNOSIS — Z794 Long term (current) use of insulin: Secondary | ICD-10-CM | POA: Diagnosis not present

## 2016-06-01 DIAGNOSIS — R1012 Left upper quadrant pain: Secondary | ICD-10-CM | POA: Diagnosis not present

## 2016-06-01 DIAGNOSIS — E1142 Type 2 diabetes mellitus with diabetic polyneuropathy: Secondary | ICD-10-CM | POA: Diagnosis not present

## 2016-06-01 DIAGNOSIS — K3184 Gastroparesis: Secondary | ICD-10-CM | POA: Diagnosis not present

## 2016-06-01 DIAGNOSIS — Z79899 Other long term (current) drug therapy: Secondary | ICD-10-CM | POA: Diagnosis not present

## 2016-06-01 DIAGNOSIS — F329 Major depressive disorder, single episode, unspecified: Secondary | ICD-10-CM | POA: Diagnosis not present

## 2016-06-01 DIAGNOSIS — E11319 Type 2 diabetes mellitus with unspecified diabetic retinopathy without macular edema: Secondary | ICD-10-CM | POA: Diagnosis not present

## 2016-06-19 DIAGNOSIS — I10 Essential (primary) hypertension: Secondary | ICD-10-CM | POA: Diagnosis not present

## 2016-06-19 DIAGNOSIS — E1143 Type 2 diabetes mellitus with diabetic autonomic (poly)neuropathy: Secondary | ICD-10-CM | POA: Diagnosis not present

## 2016-06-19 DIAGNOSIS — K3184 Gastroparesis: Secondary | ICD-10-CM | POA: Diagnosis not present

## 2016-06-19 DIAGNOSIS — Z8249 Family history of ischemic heart disease and other diseases of the circulatory system: Secondary | ICD-10-CM | POA: Diagnosis not present

## 2016-06-19 DIAGNOSIS — Z79899 Other long term (current) drug therapy: Secondary | ICD-10-CM | POA: Diagnosis not present

## 2016-06-19 DIAGNOSIS — R112 Nausea with vomiting, unspecified: Secondary | ICD-10-CM | POA: Diagnosis not present

## 2016-06-19 DIAGNOSIS — Z794 Long term (current) use of insulin: Secondary | ICD-10-CM | POA: Diagnosis not present

## 2016-06-19 DIAGNOSIS — E860 Dehydration: Secondary | ICD-10-CM | POA: Diagnosis not present

## 2016-06-19 DIAGNOSIS — F319 Bipolar disorder, unspecified: Secondary | ICD-10-CM | POA: Diagnosis not present

## 2016-06-19 DIAGNOSIS — Z833 Family history of diabetes mellitus: Secondary | ICD-10-CM | POA: Diagnosis not present

## 2016-07-01 DIAGNOSIS — E11610 Type 2 diabetes mellitus with diabetic neuropathic arthropathy: Secondary | ICD-10-CM | POA: Diagnosis not present

## 2016-07-01 DIAGNOSIS — E1140 Type 2 diabetes mellitus with diabetic neuropathy, unspecified: Secondary | ICD-10-CM | POA: Diagnosis not present

## 2016-07-01 DIAGNOSIS — Z794 Long term (current) use of insulin: Secondary | ICD-10-CM | POA: Diagnosis not present

## 2016-07-01 DIAGNOSIS — Z79899 Other long term (current) drug therapy: Secondary | ICD-10-CM | POA: Diagnosis not present

## 2016-07-01 DIAGNOSIS — L97511 Non-pressure chronic ulcer of other part of right foot limited to breakdown of skin: Secondary | ICD-10-CM | POA: Diagnosis not present

## 2016-07-01 DIAGNOSIS — L97519 Non-pressure chronic ulcer of other part of right foot with unspecified severity: Secondary | ICD-10-CM | POA: Diagnosis not present

## 2016-07-01 DIAGNOSIS — I10 Essential (primary) hypertension: Secondary | ICD-10-CM | POA: Diagnosis not present

## 2016-07-01 DIAGNOSIS — F329 Major depressive disorder, single episode, unspecified: Secondary | ICD-10-CM | POA: Diagnosis not present

## 2016-07-01 DIAGNOSIS — E11621 Type 2 diabetes mellitus with foot ulcer: Secondary | ICD-10-CM | POA: Diagnosis not present

## 2016-09-07 DIAGNOSIS — I10 Essential (primary) hypertension: Secondary | ICD-10-CM | POA: Diagnosis not present

## 2016-09-07 DIAGNOSIS — E860 Dehydration: Secondary | ICD-10-CM | POA: Diagnosis not present

## 2016-09-07 DIAGNOSIS — Z9114 Patient's other noncompliance with medication regimen: Secondary | ICD-10-CM | POA: Diagnosis not present

## 2016-09-07 DIAGNOSIS — E1142 Type 2 diabetes mellitus with diabetic polyneuropathy: Secondary | ICD-10-CM | POA: Diagnosis not present

## 2016-09-07 DIAGNOSIS — K3184 Gastroparesis: Secondary | ICD-10-CM | POA: Diagnosis not present

## 2016-09-07 DIAGNOSIS — Z880 Allergy status to penicillin: Secondary | ICD-10-CM | POA: Diagnosis not present

## 2016-09-07 DIAGNOSIS — Z9049 Acquired absence of other specified parts of digestive tract: Secondary | ICD-10-CM | POA: Diagnosis not present

## 2016-09-07 DIAGNOSIS — Z87891 Personal history of nicotine dependence: Secondary | ICD-10-CM | POA: Diagnosis not present

## 2016-09-07 DIAGNOSIS — Z794 Long term (current) use of insulin: Secondary | ICD-10-CM | POA: Diagnosis not present

## 2016-09-07 DIAGNOSIS — F909 Attention-deficit hyperactivity disorder, unspecified type: Secondary | ICD-10-CM | POA: Diagnosis not present

## 2016-09-07 DIAGNOSIS — Z888 Allergy status to other drugs, medicaments and biological substances status: Secondary | ICD-10-CM | POA: Diagnosis not present

## 2016-09-07 DIAGNOSIS — F319 Bipolar disorder, unspecified: Secondary | ICD-10-CM | POA: Diagnosis not present

## 2016-09-07 DIAGNOSIS — E11610 Type 2 diabetes mellitus with diabetic neuropathic arthropathy: Secondary | ICD-10-CM | POA: Diagnosis not present

## 2016-09-07 DIAGNOSIS — R410 Disorientation, unspecified: Secondary | ICD-10-CM | POA: Diagnosis not present

## 2016-09-07 DIAGNOSIS — E871 Hypo-osmolality and hyponatremia: Secondary | ICD-10-CM | POA: Diagnosis not present

## 2016-09-07 DIAGNOSIS — R51 Headache: Secondary | ICD-10-CM | POA: Diagnosis not present

## 2016-09-07 DIAGNOSIS — Z79899 Other long term (current) drug therapy: Secondary | ICD-10-CM | POA: Diagnosis not present

## 2016-09-07 DIAGNOSIS — R4182 Altered mental status, unspecified: Secondary | ICD-10-CM | POA: Diagnosis not present

## 2016-09-07 DIAGNOSIS — K219 Gastro-esophageal reflux disease without esophagitis: Secondary | ICD-10-CM | POA: Diagnosis not present

## 2016-09-07 DIAGNOSIS — E1143 Type 2 diabetes mellitus with diabetic autonomic (poly)neuropathy: Secondary | ICD-10-CM | POA: Diagnosis not present

## 2016-09-07 DIAGNOSIS — F419 Anxiety disorder, unspecified: Secondary | ICD-10-CM | POA: Diagnosis not present

## 2016-09-07 DIAGNOSIS — E1165 Type 2 diabetes mellitus with hyperglycemia: Secondary | ICD-10-CM | POA: Diagnosis not present

## 2016-09-08 DIAGNOSIS — R51 Headache: Secondary | ICD-10-CM | POA: Diagnosis not present

## 2016-09-08 DIAGNOSIS — E1165 Type 2 diabetes mellitus with hyperglycemia: Secondary | ICD-10-CM | POA: Diagnosis not present

## 2016-09-09 DIAGNOSIS — E1165 Type 2 diabetes mellitus with hyperglycemia: Secondary | ICD-10-CM | POA: Diagnosis not present

## 2016-09-10 DIAGNOSIS — E1165 Type 2 diabetes mellitus with hyperglycemia: Secondary | ICD-10-CM | POA: Diagnosis not present

## 2016-09-11 DIAGNOSIS — I10 Essential (primary) hypertension: Secondary | ICD-10-CM | POA: Diagnosis not present

## 2016-09-11 DIAGNOSIS — R112 Nausea with vomiting, unspecified: Secondary | ICD-10-CM | POA: Diagnosis not present

## 2016-09-11 DIAGNOSIS — Z794 Long term (current) use of insulin: Secondary | ICD-10-CM | POA: Diagnosis not present

## 2016-09-11 DIAGNOSIS — E1165 Type 2 diabetes mellitus with hyperglycemia: Secondary | ICD-10-CM | POA: Diagnosis not present

## 2016-09-11 DIAGNOSIS — Z87891 Personal history of nicotine dependence: Secondary | ICD-10-CM | POA: Diagnosis not present

## 2016-09-11 DIAGNOSIS — Z79899 Other long term (current) drug therapy: Secondary | ICD-10-CM | POA: Diagnosis not present

## 2016-09-11 DIAGNOSIS — F319 Bipolar disorder, unspecified: Secondary | ICD-10-CM | POA: Diagnosis not present

## 2016-09-11 DIAGNOSIS — E1142 Type 2 diabetes mellitus with diabetic polyneuropathy: Secondary | ICD-10-CM | POA: Diagnosis not present

## 2016-09-17 DIAGNOSIS — R1110 Vomiting, unspecified: Secondary | ICD-10-CM | POA: Diagnosis not present

## 2016-09-17 DIAGNOSIS — Z5321 Procedure and treatment not carried out due to patient leaving prior to being seen by health care provider: Secondary | ICD-10-CM | POA: Diagnosis not present

## 2016-09-18 DIAGNOSIS — E11319 Type 2 diabetes mellitus with unspecified diabetic retinopathy without macular edema: Secondary | ICD-10-CM | POA: Diagnosis not present

## 2016-09-18 DIAGNOSIS — Z87891 Personal history of nicotine dependence: Secondary | ICD-10-CM | POA: Diagnosis not present

## 2016-09-18 DIAGNOSIS — G44319 Acute post-traumatic headache, not intractable: Secondary | ICD-10-CM | POA: Diagnosis not present

## 2016-09-18 DIAGNOSIS — S0012XA Contusion of left eyelid and periocular area, initial encounter: Secondary | ICD-10-CM | POA: Diagnosis not present

## 2016-09-18 DIAGNOSIS — F0781 Postconcussional syndrome: Secondary | ICD-10-CM | POA: Diagnosis not present

## 2016-09-18 DIAGNOSIS — Z79899 Other long term (current) drug therapy: Secondary | ICD-10-CM | POA: Diagnosis not present

## 2016-09-18 DIAGNOSIS — E1142 Type 2 diabetes mellitus with diabetic polyneuropathy: Secondary | ICD-10-CM | POA: Diagnosis not present

## 2016-09-18 DIAGNOSIS — S0990XA Unspecified injury of head, initial encounter: Secondary | ICD-10-CM | POA: Diagnosis not present

## 2016-09-18 DIAGNOSIS — R220 Localized swelling, mass and lump, head: Secondary | ICD-10-CM | POA: Diagnosis not present

## 2016-09-18 DIAGNOSIS — K3184 Gastroparesis: Secondary | ICD-10-CM | POA: Diagnosis not present

## 2016-09-18 DIAGNOSIS — F329 Major depressive disorder, single episode, unspecified: Secondary | ICD-10-CM | POA: Diagnosis not present

## 2016-09-18 DIAGNOSIS — E1143 Type 2 diabetes mellitus with diabetic autonomic (poly)neuropathy: Secondary | ICD-10-CM | POA: Diagnosis not present

## 2016-09-18 DIAGNOSIS — I10 Essential (primary) hypertension: Secondary | ICD-10-CM | POA: Diagnosis not present

## 2016-09-18 DIAGNOSIS — Z794 Long term (current) use of insulin: Secondary | ICD-10-CM | POA: Diagnosis not present

## 2017-06-16 DIAGNOSIS — H66003 Acute suppurative otitis media without spontaneous rupture of ear drum, bilateral: Secondary | ICD-10-CM | POA: Diagnosis not present

## 2017-06-16 DIAGNOSIS — R930 Abnormal findings on diagnostic imaging of skull and head, not elsewhere classified: Secondary | ICD-10-CM | POA: Diagnosis not present

## 2017-06-16 DIAGNOSIS — R51 Headache: Secondary | ICD-10-CM | POA: Diagnosis not present

## 2017-06-16 DIAGNOSIS — R7309 Other abnormal glucose: Secondary | ICD-10-CM | POA: Diagnosis not present

## 2017-06-16 DIAGNOSIS — Z880 Allergy status to penicillin: Secondary | ICD-10-CM | POA: Diagnosis not present

## 2017-06-16 DIAGNOSIS — E782 Mixed hyperlipidemia: Secondary | ICD-10-CM | POA: Diagnosis present

## 2017-06-16 DIAGNOSIS — F209 Schizophrenia, unspecified: Secondary | ICD-10-CM | POA: Diagnosis not present

## 2017-06-16 DIAGNOSIS — F319 Bipolar disorder, unspecified: Secondary | ICD-10-CM | POA: Diagnosis present

## 2017-06-16 DIAGNOSIS — M868X8 Other osteomyelitis, other site: Secondary | ICD-10-CM | POA: Diagnosis not present

## 2017-06-16 DIAGNOSIS — E785 Hyperlipidemia, unspecified: Secondary | ICD-10-CM | POA: Diagnosis not present

## 2017-06-16 DIAGNOSIS — Z590 Homelessness: Secondary | ICD-10-CM | POA: Diagnosis not present

## 2017-06-16 DIAGNOSIS — E1165 Type 2 diabetes mellitus with hyperglycemia: Secondary | ICD-10-CM | POA: Diagnosis not present

## 2017-06-16 DIAGNOSIS — H6693 Otitis media, unspecified, bilateral: Secondary | ICD-10-CM | POA: Diagnosis not present

## 2017-06-16 DIAGNOSIS — H7093 Unspecified mastoiditis, bilateral: Secondary | ICD-10-CM | POA: Diagnosis not present

## 2017-06-16 DIAGNOSIS — H70003 Acute mastoiditis without complications, bilateral: Secondary | ICD-10-CM | POA: Diagnosis not present

## 2017-06-16 DIAGNOSIS — I6523 Occlusion and stenosis of bilateral carotid arteries: Secondary | ICD-10-CM | POA: Diagnosis not present

## 2017-06-16 DIAGNOSIS — K219 Gastro-esophageal reflux disease without esophagitis: Secondary | ICD-10-CM | POA: Diagnosis not present

## 2017-06-16 DIAGNOSIS — I951 Orthostatic hypotension: Secondary | ICD-10-CM | POA: Diagnosis present

## 2017-06-16 DIAGNOSIS — M869 Osteomyelitis, unspecified: Secondary | ICD-10-CM | POA: Diagnosis not present

## 2017-06-16 DIAGNOSIS — Z9114 Patient's other noncompliance with medication regimen: Secondary | ICD-10-CM | POA: Diagnosis not present

## 2017-06-16 DIAGNOSIS — R55 Syncope and collapse: Secondary | ICD-10-CM | POA: Diagnosis not present

## 2017-06-16 DIAGNOSIS — E108 Type 1 diabetes mellitus with unspecified complications: Secondary | ICD-10-CM | POA: Diagnosis not present

## 2017-06-16 DIAGNOSIS — Z833 Family history of diabetes mellitus: Secondary | ICD-10-CM | POA: Diagnosis not present

## 2017-06-16 DIAGNOSIS — H70001 Acute mastoiditis without complications, right ear: Secondary | ICD-10-CM | POA: Diagnosis present

## 2017-06-16 DIAGNOSIS — H7013 Chronic mastoiditis, bilateral: Secondary | ICD-10-CM | POA: Diagnosis not present

## 2017-06-16 DIAGNOSIS — E119 Type 2 diabetes mellitus without complications: Secondary | ICD-10-CM | POA: Diagnosis not present

## 2017-06-16 DIAGNOSIS — Z792 Long term (current) use of antibiotics: Secondary | ICD-10-CM | POA: Diagnosis not present

## 2017-06-16 DIAGNOSIS — Z9111 Patient's noncompliance with dietary regimen: Secondary | ICD-10-CM | POA: Diagnosis not present

## 2017-06-16 DIAGNOSIS — H70092 Acute mastoiditis with other complications, left ear: Secondary | ICD-10-CM | POA: Diagnosis not present

## 2017-06-16 DIAGNOSIS — H7092 Unspecified mastoiditis, left ear: Secondary | ICD-10-CM | POA: Diagnosis not present

## 2017-06-16 DIAGNOSIS — H7090 Unspecified mastoiditis, unspecified ear: Secondary | ICD-10-CM | POA: Diagnosis not present

## 2017-06-16 DIAGNOSIS — E860 Dehydration: Secondary | ICD-10-CM | POA: Diagnosis present

## 2017-06-16 DIAGNOSIS — Z794 Long term (current) use of insulin: Secondary | ICD-10-CM | POA: Diagnosis not present

## 2017-06-16 DIAGNOSIS — F17210 Nicotine dependence, cigarettes, uncomplicated: Secondary | ICD-10-CM | POA: Diagnosis present

## 2017-06-18 DIAGNOSIS — Z880 Allergy status to penicillin: Secondary | ICD-10-CM | POA: Diagnosis not present

## 2017-06-18 DIAGNOSIS — I951 Orthostatic hypotension: Secondary | ICD-10-CM | POA: Diagnosis present

## 2017-06-18 DIAGNOSIS — F319 Bipolar disorder, unspecified: Secondary | ICD-10-CM | POA: Diagnosis present

## 2017-06-18 DIAGNOSIS — H7013 Chronic mastoiditis, bilateral: Secondary | ICD-10-CM | POA: Diagnosis present

## 2017-06-18 DIAGNOSIS — F329 Major depressive disorder, single episode, unspecified: Secondary | ICD-10-CM | POA: Diagnosis not present

## 2017-06-18 DIAGNOSIS — H70003 Acute mastoiditis without complications, bilateral: Secondary | ICD-10-CM | POA: Diagnosis not present

## 2017-06-18 DIAGNOSIS — H70012 Subperiosteal abscess of mastoid, left ear: Secondary | ICD-10-CM | POA: Diagnosis present

## 2017-06-18 DIAGNOSIS — H7012 Chronic mastoiditis, left ear: Secondary | ICD-10-CM | POA: Diagnosis not present

## 2017-06-18 DIAGNOSIS — H9193 Unspecified hearing loss, bilateral: Secondary | ICD-10-CM | POA: Diagnosis present

## 2017-06-18 DIAGNOSIS — Z609 Problem related to social environment, unspecified: Secondary | ICD-10-CM | POA: Diagnosis not present

## 2017-06-18 DIAGNOSIS — Z794 Long term (current) use of insulin: Secondary | ICD-10-CM | POA: Diagnosis not present

## 2017-06-18 DIAGNOSIS — F419 Anxiety disorder, unspecified: Secondary | ICD-10-CM | POA: Diagnosis present

## 2017-06-18 DIAGNOSIS — H7093 Unspecified mastoiditis, bilateral: Secondary | ICD-10-CM | POA: Diagnosis not present

## 2017-06-18 DIAGNOSIS — H9190 Unspecified hearing loss, unspecified ear: Secondary | ICD-10-CM | POA: Diagnosis not present

## 2017-06-18 DIAGNOSIS — R350 Frequency of micturition: Secondary | ICD-10-CM | POA: Diagnosis not present

## 2017-06-18 DIAGNOSIS — H7091 Unspecified mastoiditis, right ear: Secondary | ICD-10-CM | POA: Diagnosis not present

## 2017-06-18 DIAGNOSIS — H70092 Acute mastoiditis with other complications, left ear: Secondary | ICD-10-CM | POA: Diagnosis not present

## 2017-06-18 DIAGNOSIS — R55 Syncope and collapse: Secondary | ICD-10-CM | POA: Diagnosis not present

## 2017-06-18 DIAGNOSIS — K219 Gastro-esophageal reflux disease without esophagitis: Secondary | ICD-10-CM | POA: Diagnosis present

## 2017-06-18 DIAGNOSIS — H70002 Acute mastoiditis without complications, left ear: Secondary | ICD-10-CM | POA: Diagnosis not present

## 2017-06-18 DIAGNOSIS — F17210 Nicotine dependence, cigarettes, uncomplicated: Secondary | ICD-10-CM | POA: Diagnosis present

## 2017-06-18 DIAGNOSIS — H9203 Otalgia, bilateral: Secondary | ICD-10-CM | POA: Diagnosis present

## 2017-06-18 DIAGNOSIS — F2089 Other schizophrenia: Secondary | ICD-10-CM | POA: Diagnosis not present

## 2017-06-18 DIAGNOSIS — Z792 Long term (current) use of antibiotics: Secondary | ICD-10-CM | POA: Diagnosis not present

## 2017-06-18 DIAGNOSIS — Z9889 Other specified postprocedural states: Secondary | ICD-10-CM | POA: Diagnosis not present

## 2017-06-18 DIAGNOSIS — R030 Elevated blood-pressure reading, without diagnosis of hypertension: Secondary | ICD-10-CM | POA: Diagnosis not present

## 2017-06-18 DIAGNOSIS — R112 Nausea with vomiting, unspecified: Secondary | ICD-10-CM | POA: Diagnosis not present

## 2017-06-18 DIAGNOSIS — H7092 Unspecified mastoiditis, left ear: Secondary | ICD-10-CM | POA: Diagnosis not present

## 2017-06-18 DIAGNOSIS — F209 Schizophrenia, unspecified: Secondary | ICD-10-CM | POA: Diagnosis present

## 2017-06-18 DIAGNOSIS — Z9111 Patient's noncompliance with dietary regimen: Secondary | ICD-10-CM | POA: Diagnosis not present

## 2017-06-18 DIAGNOSIS — R51 Headache: Secondary | ICD-10-CM | POA: Diagnosis not present

## 2017-06-18 DIAGNOSIS — I10 Essential (primary) hypertension: Secondary | ICD-10-CM | POA: Diagnosis present

## 2017-06-18 DIAGNOSIS — E1165 Type 2 diabetes mellitus with hyperglycemia: Secondary | ICD-10-CM | POA: Diagnosis present

## 2017-06-18 DIAGNOSIS — Z9114 Patient's other noncompliance with medication regimen: Secondary | ICD-10-CM | POA: Diagnosis not present

## 2017-06-18 DIAGNOSIS — E861 Hypovolemia: Secondary | ICD-10-CM | POA: Diagnosis present

## 2017-06-18 DIAGNOSIS — H9202 Otalgia, left ear: Secondary | ICD-10-CM | POA: Diagnosis not present

## 2017-06-18 DIAGNOSIS — M868X8 Other osteomyelitis, other site: Secondary | ICD-10-CM | POA: Diagnosis present

## 2017-07-04 DIAGNOSIS — F250 Schizoaffective disorder, bipolar type: Secondary | ICD-10-CM | POA: Diagnosis not present

## 2017-07-04 DIAGNOSIS — F2089 Other schizophrenia: Secondary | ICD-10-CM | POA: Diagnosis not present

## 2017-07-04 DIAGNOSIS — Z452 Encounter for adjustment and management of vascular access device: Secondary | ICD-10-CM | POA: Diagnosis not present

## 2017-07-04 DIAGNOSIS — I10 Essential (primary) hypertension: Secondary | ICD-10-CM | POA: Diagnosis present

## 2017-07-04 DIAGNOSIS — F319 Bipolar disorder, unspecified: Secondary | ICD-10-CM | POA: Diagnosis present

## 2017-07-04 DIAGNOSIS — M868X8 Other osteomyelitis, other site: Secondary | ICD-10-CM | POA: Diagnosis present

## 2017-07-04 DIAGNOSIS — E118 Type 2 diabetes mellitus with unspecified complications: Secondary | ICD-10-CM | POA: Diagnosis not present

## 2017-07-04 DIAGNOSIS — Z880 Allergy status to penicillin: Secondary | ICD-10-CM | POA: Diagnosis not present

## 2017-07-04 DIAGNOSIS — H7092 Unspecified mastoiditis, left ear: Secondary | ICD-10-CM | POA: Diagnosis not present

## 2017-07-04 DIAGNOSIS — E1169 Type 2 diabetes mellitus with other specified complication: Secondary | ICD-10-CM | POA: Diagnosis present

## 2017-07-04 DIAGNOSIS — E876 Hypokalemia: Secondary | ICD-10-CM | POA: Diagnosis not present

## 2017-07-04 DIAGNOSIS — H70002 Acute mastoiditis without complications, left ear: Secondary | ICD-10-CM | POA: Diagnosis present

## 2017-07-04 DIAGNOSIS — F419 Anxiety disorder, unspecified: Secondary | ICD-10-CM | POA: Diagnosis present

## 2017-07-04 DIAGNOSIS — E1165 Type 2 diabetes mellitus with hyperglycemia: Secondary | ICD-10-CM | POA: Diagnosis present

## 2017-07-04 DIAGNOSIS — Z609 Problem related to social environment, unspecified: Secondary | ICD-10-CM | POA: Diagnosis not present

## 2017-07-04 DIAGNOSIS — Z590 Homelessness: Secondary | ICD-10-CM | POA: Diagnosis not present

## 2017-07-04 DIAGNOSIS — H7091 Unspecified mastoiditis, right ear: Secondary | ICD-10-CM | POA: Diagnosis present

## 2017-07-04 DIAGNOSIS — Z794 Long term (current) use of insulin: Secondary | ICD-10-CM | POA: Diagnosis not present

## 2017-07-04 DIAGNOSIS — H7093 Unspecified mastoiditis, bilateral: Secondary | ICD-10-CM | POA: Diagnosis not present

## 2017-07-04 DIAGNOSIS — R55 Syncope and collapse: Secondary | ICD-10-CM | POA: Diagnosis not present

## 2017-07-04 DIAGNOSIS — H70003 Acute mastoiditis without complications, bilateral: Secondary | ICD-10-CM | POA: Diagnosis not present

## 2017-07-04 DIAGNOSIS — I951 Orthostatic hypotension: Secondary | ICD-10-CM | POA: Diagnosis present

## 2017-07-04 DIAGNOSIS — F209 Schizophrenia, unspecified: Secondary | ICD-10-CM | POA: Diagnosis present

## 2017-07-04 DIAGNOSIS — H9192 Unspecified hearing loss, left ear: Secondary | ICD-10-CM | POA: Diagnosis present

## 2017-07-04 DIAGNOSIS — Z792 Long term (current) use of antibiotics: Secondary | ICD-10-CM | POA: Diagnosis not present

## 2017-07-04 DIAGNOSIS — T82524A Displacement of infusion catheter, initial encounter: Secondary | ICD-10-CM | POA: Diagnosis not present

## 2017-07-04 DIAGNOSIS — I959 Hypotension, unspecified: Secondary | ICD-10-CM | POA: Diagnosis not present

## 2017-09-25 DIAGNOSIS — R112 Nausea with vomiting, unspecified: Secondary | ICD-10-CM | POA: Diagnosis not present

## 2017-09-25 DIAGNOSIS — Z79899 Other long term (current) drug therapy: Secondary | ICD-10-CM | POA: Diagnosis not present

## 2017-09-25 DIAGNOSIS — Z8673 Personal history of transient ischemic attack (TIA), and cerebral infarction without residual deficits: Secondary | ICD-10-CM | POA: Diagnosis not present

## 2017-09-25 DIAGNOSIS — I259 Chronic ischemic heart disease, unspecified: Secondary | ICD-10-CM | POA: Diagnosis not present

## 2017-09-25 DIAGNOSIS — K219 Gastro-esophageal reflux disease without esophagitis: Secondary | ICD-10-CM | POA: Diagnosis not present

## 2017-09-25 DIAGNOSIS — R262 Difficulty in walking, not elsewhere classified: Secondary | ICD-10-CM | POA: Diagnosis not present

## 2017-09-25 DIAGNOSIS — E1165 Type 2 diabetes mellitus with hyperglycemia: Secondary | ICD-10-CM | POA: Diagnosis not present

## 2017-09-25 DIAGNOSIS — E1140 Type 2 diabetes mellitus with diabetic neuropathy, unspecified: Secondary | ICD-10-CM | POA: Diagnosis not present

## 2017-09-25 DIAGNOSIS — Z794 Long term (current) use of insulin: Secondary | ICD-10-CM | POA: Diagnosis not present

## 2017-09-25 DIAGNOSIS — Z9114 Patient's other noncompliance with medication regimen: Secondary | ICD-10-CM | POA: Diagnosis not present

## 2017-09-25 DIAGNOSIS — Z9049 Acquired absence of other specified parts of digestive tract: Secondary | ICD-10-CM | POA: Diagnosis not present

## 2017-09-25 DIAGNOSIS — I119 Hypertensive heart disease without heart failure: Secondary | ICD-10-CM | POA: Diagnosis not present

## 2017-09-25 DIAGNOSIS — I200 Unstable angina: Secondary | ICD-10-CM | POA: Diagnosis not present

## 2017-09-25 DIAGNOSIS — R000 Tachycardia, unspecified: Secondary | ICD-10-CM | POA: Diagnosis not present

## 2017-09-25 DIAGNOSIS — R110 Nausea: Secondary | ICD-10-CM | POA: Diagnosis not present

## 2017-09-25 DIAGNOSIS — E1143 Type 2 diabetes mellitus with diabetic autonomic (poly)neuropathy: Secondary | ICD-10-CM | POA: Diagnosis not present

## 2017-09-25 DIAGNOSIS — Z87891 Personal history of nicotine dependence: Secondary | ICD-10-CM | POA: Diagnosis not present

## 2017-09-25 DIAGNOSIS — I10 Essential (primary) hypertension: Secondary | ICD-10-CM | POA: Diagnosis not present

## 2017-09-25 DIAGNOSIS — K3184 Gastroparesis: Secondary | ICD-10-CM | POA: Diagnosis not present

## 2017-09-25 DIAGNOSIS — R079 Chest pain, unspecified: Secondary | ICD-10-CM | POA: Diagnosis not present

## 2017-09-25 DIAGNOSIS — R55 Syncope and collapse: Secondary | ICD-10-CM | POA: Diagnosis not present

## 2017-09-25 DIAGNOSIS — R42 Dizziness and giddiness: Secondary | ICD-10-CM | POA: Diagnosis not present

## 2017-09-25 DIAGNOSIS — K589 Irritable bowel syndrome without diarrhea: Secondary | ICD-10-CM | POA: Diagnosis not present

## 2017-09-25 DIAGNOSIS — E119 Type 2 diabetes mellitus without complications: Secondary | ICD-10-CM | POA: Diagnosis not present

## 2017-09-25 DIAGNOSIS — M6281 Muscle weakness (generalized): Secondary | ICD-10-CM | POA: Diagnosis not present

## 2017-09-25 DIAGNOSIS — S30861A Insect bite (nonvenomous) of abdominal wall, initial encounter: Secondary | ICD-10-CM | POA: Diagnosis not present

## 2017-09-25 DIAGNOSIS — M25512 Pain in left shoulder: Secondary | ICD-10-CM | POA: Diagnosis not present

## 2017-09-25 DIAGNOSIS — R0602 Shortness of breath: Secondary | ICD-10-CM | POA: Diagnosis not present

## 2017-09-25 DIAGNOSIS — W19XXXA Unspecified fall, initial encounter: Secondary | ICD-10-CM | POA: Diagnosis not present

## 2017-09-25 DIAGNOSIS — F319 Bipolar disorder, unspecified: Secondary | ICD-10-CM | POA: Diagnosis not present

## 2017-09-26 DIAGNOSIS — I5189 Other ill-defined heart diseases: Secondary | ICD-10-CM | POA: Diagnosis not present

## 2017-09-26 DIAGNOSIS — I10 Essential (primary) hypertension: Secondary | ICD-10-CM | POA: Diagnosis not present

## 2017-09-26 DIAGNOSIS — E1140 Type 2 diabetes mellitus with diabetic neuropathy, unspecified: Secondary | ICD-10-CM | POA: Diagnosis not present

## 2017-09-26 DIAGNOSIS — E1143 Type 2 diabetes mellitus with diabetic autonomic (poly)neuropathy: Secondary | ICD-10-CM | POA: Diagnosis not present

## 2017-09-26 DIAGNOSIS — R079 Chest pain, unspecified: Secondary | ICD-10-CM | POA: Diagnosis not present

## 2017-10-05 ENCOUNTER — Encounter (HOSPITAL_COMMUNITY): Payer: Self-pay | Admitting: Emergency Medicine

## 2017-10-05 ENCOUNTER — Other Ambulatory Visit: Payer: Self-pay

## 2017-10-05 ENCOUNTER — Observation Stay (HOSPITAL_COMMUNITY)
Admission: EM | Admit: 2017-10-05 | Discharge: 2017-10-07 | Disposition: A | Discharge status: Home / Self Care | Payer: Medicare Other | Attending: Internal Medicine | Admitting: Internal Medicine

## 2017-10-05 ENCOUNTER — Emergency Department (HOSPITAL_COMMUNITY): Payer: Medicare Other

## 2017-10-05 DIAGNOSIS — E1165 Type 2 diabetes mellitus with hyperglycemia: Secondary | ICD-10-CM | POA: Insufficient documentation

## 2017-10-05 DIAGNOSIS — E1340 Other specified diabetes mellitus with diabetic neuropathy, unspecified: Secondary | ICD-10-CM | POA: Diagnosis not present

## 2017-10-05 DIAGNOSIS — R55 Syncope and collapse: Secondary | ICD-10-CM | POA: Diagnosis not present

## 2017-10-05 DIAGNOSIS — I10 Essential (primary) hypertension: Secondary | ICD-10-CM | POA: Diagnosis present

## 2017-10-05 DIAGNOSIS — R079 Chest pain, unspecified: Secondary | ICD-10-CM | POA: Diagnosis not present

## 2017-10-05 DIAGNOSIS — R0781 Pleurodynia: Secondary | ICD-10-CM | POA: Diagnosis not present

## 2017-10-05 DIAGNOSIS — Z79899 Other long term (current) drug therapy: Secondary | ICD-10-CM | POA: Insufficient documentation

## 2017-10-05 DIAGNOSIS — E1140 Type 2 diabetes mellitus with diabetic neuropathy, unspecified: Secondary | ICD-10-CM | POA: Insufficient documentation

## 2017-10-05 DIAGNOSIS — Z794 Long term (current) use of insulin: Secondary | ICD-10-CM | POA: Insufficient documentation

## 2017-10-05 DIAGNOSIS — R0789 Other chest pain: Secondary | ICD-10-CM | POA: Diagnosis not present

## 2017-10-05 DIAGNOSIS — F319 Bipolar disorder, unspecified: Secondary | ICD-10-CM | POA: Diagnosis not present

## 2017-10-05 LAB — CBC
HCT: 48.1 % (ref 39.0–52.0)
Hemoglobin: 15.8 g/dL (ref 13.0–17.0)
MCH: 29 pg (ref 26.0–34.0)
MCHC: 32.8 g/dL (ref 30.0–36.0)
MCV: 88.3 fL (ref 78.0–100.0)
PLATELETS: 266 10*3/uL (ref 150–400)
RBC: 5.45 MIL/uL (ref 4.22–5.81)
RDW: 13.1 % (ref 11.5–15.5)
WBC: 8.8 10*3/uL (ref 4.0–10.5)

## 2017-10-05 LAB — GLUCOSE, CAPILLARY
GLUCOSE-CAPILLARY: 215 mg/dL — AB (ref 65–99)
GLUCOSE-CAPILLARY: 173 mg/dL — AB (ref 65–99)

## 2017-10-05 LAB — BASIC METABOLIC PANEL
Anion gap: 10 (ref 5–15)
BUN: 21 mg/dL — ABNORMAL HIGH (ref 6–20)
CHLORIDE: 104 mmol/L (ref 101–111)
CO2: 26 mmol/L (ref 22–32)
Calcium: 9.5 mg/dL (ref 8.9–10.3)
Creatinine, Ser: 1.17 mg/dL (ref 0.61–1.24)
GFR calc Af Amer: >60 mL/min (ref >60)
GFR calc non Af Amer: >60 mL/min (ref >60)
Glucose, Bld: 328 mg/dL — ABNORMAL HIGH (ref 65–99)
POTASSIUM: 3.7 mmol/L (ref 3.5–5.1)
SODIUM: 140 mmol/L (ref 135–145)

## 2017-10-05 LAB — I-STAT TROPONIN, ED: Troponin i, poc: 0 ng/mL (ref 0.00–0.08)

## 2017-10-05 LAB — TROPONIN I
Troponin I: <0.03 ng/mL (ref <0.03)
Troponin I: <0.03 ng/mL (ref <0.03)
Troponin I: <0.03 ng/mL (ref <0.03)

## 2017-10-05 LAB — D-DIMER, QUANTITATIVE: D-Dimer, Quant: 0.42 ug/mL-FEU (ref 0.00–0.50)

## 2017-10-05 MED ORDER — ESCITALOPRAM OXALATE 10 MG PO TABS
20.0000 mg | ORAL_TABLET | Freq: Every day | ORAL | Status: DC
  Administered 2017-10-05 – 2017-10-06 (×2): 20 mg via ORAL
  Filled 2017-10-05: qty 1
  Filled 2017-10-05 (×2): qty 2
  Filled 2017-10-05 (×2): qty 1

## 2017-10-05 MED ORDER — PREGABALIN 75 MG PO CAPS
200.0000 mg | ORAL_CAPSULE | Freq: Three times a day (TID) | ORAL | Status: DC
  Administered 2017-10-05 – 2017-10-07 (×5): 200 mg via ORAL
  Filled 2017-10-05 (×5): qty 1

## 2017-10-05 MED ORDER — ONDANSETRON HCL 4 MG/2ML IJ SOLN
INTRAMUSCULAR | Status: AC
  Administered 2017-10-05: 4 mg via INTRAVENOUS
  Filled 2017-10-05: qty 2

## 2017-10-05 MED ORDER — VITAMIN D (ERGOCALCIFEROL) 1.25 MG (50000 UNIT) PO CAPS: 50000.0000 [IU] | ORAL_CAPSULE | ORAL | Status: DC

## 2017-10-05 MED ORDER — GI COCKTAIL ~~LOC~~: 30.0000 mL | Freq: Four times a day (QID) | ORAL | Status: DC | PRN

## 2017-10-05 MED ORDER — INSULIN ASPART 100 UNIT/ML ~~LOC~~ SOLN
0.0000 [IU] | Freq: Three times a day (TID) | SUBCUTANEOUS | Status: DC
  Administered 2017-10-06 (×2): 2 [IU] via SUBCUTANEOUS

## 2017-10-05 MED ORDER — VITAMIN B-12 100 MCG PO TABS
100.0000 ug | ORAL_TABLET | Freq: Every day | ORAL | Status: DC
  Administered 2017-10-05 – 2017-10-07 (×3): 100 ug via ORAL
  Filled 2017-10-05 (×3): qty 1

## 2017-10-05 MED ORDER — PRAVASTATIN SODIUM 10 MG PO TABS
10.0000 mg | ORAL_TABLET | Freq: Every day | ORAL | Status: DC
  Administered 2017-10-05: 10 mg via ORAL
  Filled 2017-10-05: qty 1

## 2017-10-05 MED ORDER — ACETAMINOPHEN 325 MG PO TABS
650.0000 mg | ORAL_TABLET | Freq: Once | ORAL | Status: AC
  Administered 2017-10-05: 650 mg via ORAL
  Filled 2017-10-05: qty 2

## 2017-10-05 MED ORDER — ACETAMINOPHEN 325 MG PO TABS
650.0000 mg | ORAL_TABLET | ORAL | Status: DC | PRN
  Administered 2017-10-06: 650 mg via ORAL
  Filled 2017-10-05: qty 2

## 2017-10-05 MED ORDER — LISINOPRIL 10 MG PO TABS
20.0000 mg | ORAL_TABLET | Freq: Every day | ORAL | Status: DC
  Administered 2017-10-05 – 2017-10-07 (×3): 20 mg via ORAL
  Filled 2017-10-05 (×3): qty 2

## 2017-10-05 MED ORDER — ASPIRIN EC 325 MG PO TBEC
325.0000 mg | DELAYED_RELEASE_TABLET | Freq: Every day | ORAL | Status: DC
  Administered 2017-10-05 – 2017-10-07 (×3): 325 mg via ORAL
  Filled 2017-10-05 (×3): qty 1

## 2017-10-05 MED ORDER — NORTRIPTYLINE HCL 25 MG PO CAPS
75.0000 mg | ORAL_CAPSULE | Freq: Three times a day (TID) | ORAL | Status: DC
  Administered 2017-10-05 – 2017-10-07 (×5): 75 mg via ORAL
  Filled 2017-10-05 (×8): qty 3

## 2017-10-05 MED ORDER — SODIUM CHLORIDE 0.9 % IV BOLUS
1000.0000 mL | Freq: Once | INTRAVENOUS | Status: AC
  Administered 2017-10-05: 1000 mL via INTRAVENOUS

## 2017-10-05 MED ORDER — MORPHINE SULFATE (PF) 2 MG/ML IV SOLN: 2.0000 mg | INTRAVENOUS | Status: DC | PRN

## 2017-10-05 MED ORDER — INSULIN ASPART PROT & ASPART (70-30 MIX) 100 UNIT/ML ~~LOC~~ SUSP
50.0000 [IU] | Freq: Two times a day (BID) | SUBCUTANEOUS | Status: DC
  Administered 2017-10-05: 50 [IU] via SUBCUTANEOUS
  Filled 2017-10-05 (×2): qty 10

## 2017-10-05 MED ORDER — METOPROLOL TARTRATE 50 MG PO TABS
100.0000 mg | ORAL_TABLET | Freq: Two times a day (BID) | ORAL | Status: DC
  Administered 2017-10-05 – 2017-10-07 (×3): 100 mg via ORAL
  Filled 2017-10-05 (×4): qty 2

## 2017-10-05 MED ORDER — ONDANSETRON HCL 4 MG/2ML IJ SOLN
4.0000 mg | Freq: Four times a day (QID) | INTRAMUSCULAR | Status: DC | PRN
  Administered 2017-10-05: 4 mg via INTRAVENOUS

## 2017-10-05 NOTE — ED Provider Notes (Addendum)
[facility] EMERGENCY DEPARTMENT Provider Note   CSN: [id number] Arrival date & time: 10/05/17  1511     History   Chief Complaint Chief Complaint  Patient presents with  . Chest Pain    HPI [name] is a 57 y.o. male.brought by EMS.complains of anterior chest pain pleuritic in nature worse with exertion constant since last night. Pain also somewhat worse with deep breathing.discomfort is constant. He reports syncopal event approximately once per day for the past 6 days. He also reports vomiting 3 or 4 episodes today and diarrhea 3 or 4 episodes today. No blood per rectum no fever no hematemesis. He reports overnight stay at [facility] in [place] him last week for same complaint. He reports he spent a few days in the hospital.EMS treated patient with one sublingual nitroglycerin and 4 baby aspirin's which took pain from a 10/20/2003.  HPI  Past Medical History:  Diagnosis Date  . Anxiety   . Bipolar disorder ([place])   . Chronic pain   . Depression   . Diabetes mellitus without complication ([place])   . Diverticulitis   . GERD (gastroesophageal reflux disease)   . Hypertension   . IBS (irritable bowel syndrome)   . Kidney stone   . Migraine headache   . Neuropathy     Patient Active Problem List   Diagnosis Date Noted  . Fever 12/04/2014  . Cellulitis of left foot 12/04/2014  . Diabetes mellitus with neuropathy ([place]) 12/04/2014  . Hypertension 12/04/2014    Past Surgical History:  Procedure Laterality Date  . ANKLE SURGERY Right   . CARPAL TUNNEL RELEASE    . CHOLECYSTECTOMY    . COLONOSCOPY          Home Medications    Prior to Admission medications   Medication Sig Start Date End Date Taking? Authorizing Provider  doxycycline (VIBRAMYCIN) 100 MG capsule Take 1 capsule (100 mg total) by mouth 2 (two) times daily. 12/05/14   Caren Griffins, MD  escitalopram (LEXAPRO) 20 MG tablet Take 20 mg by mouth daily.    [provider]  insulin aspart  protamine- aspart (NOVOLOG MIX 70/30) (70-30) 100 UNIT/ML injection Inject 50 Units into the skin 2 (two) times daily.    [provider]  lisinopril (PRINIVIL,ZESTRIL) 20 MG tablet Take 20 mg by mouth daily.    [provider]  LORazepam (ATIVAN) 0.5 MG tablet Take 0.5 mg by mouth every 8 (eight) hours.    [provider]  lurasidone (LATUDA) 40 MG TABS tablet Take 40 mg by mouth daily with breakfast.    [provider]  metFORMIN (GLUCOPHAGE) 1000 MG tablet Take 1,000 mg by mouth 3 (three) times daily.    [provider]  metoprolol (LOPRESSOR) 100 MG tablet Take 100 mg by mouth 2 (two) times daily.    [provider]  nortriptyline (PAMELOR) 75 MG capsule Take 75 mg by mouth 3 (three) times daily.    [provider]  omeprazole (PRILOSEC) 20 MG capsule Take 20 mg by mouth daily.    [provider]  ondansetron (ZOFRAN-ODT) 4 MG disintegrating tablet Take 4 mg by mouth every 8 (eight) hours as needed for nausea or vomiting.    [provider]  oxyCODONE-acetaminophen (PERCOCET/ROXICET) 5-325 MG per tablet Take 1 tablet by mouth every 4 (four) hours as needed for severe pain. 12/05/14   Caren Griffins, MD  pravastatin (PRAVACHOL) 10 MG tablet Take 10 mg by mouth at bedtime.  [provider]  pregabalin (LYRICA) 200 MG capsule Take 200 mg by mouth 3 (three) times daily.    [provider]  vitamin B-12 (CYANOCOBALAMIN) 100 MCG tablet Take 100 mcg by mouth daily.    [provider]  Vitamin D, Ergocalciferol, (DRISDOL) 50000 UNITS CAPS capsule Take 50,000 Units by mouth every 7 (seven) days. Usually takes on Wednesday    [provider]  zolpidem (AMBIEN) 10 MG tablet Take 10 mg by mouth at bedtime as needed for sleep.    [provider]    Family History History reviewed. No pertinent family history. Mother had MI in her 15s Social History Social History   Tobacco  Use  . Smoking status: Never Smoker  Substance Use Topics  . Alcohol use: No  . Drug use: Not Currently     Allergies   Meloxicam; Venlafaxine; and Penicillins   Review of Systems Review of Systems  Constitutional: Negative.   HENT: Negative.   Respiratory: Negative.   Cardiovascular: Positive for chest pain.  Gastrointestinal: Positive for diarrhea and vomiting.  Musculoskeletal: Negative.   Skin: Negative.   Neurological: Positive for headaches.  Psychiatric/Behavioral: Negative.   All other systems reviewed and are negative.    Physical Exam Updated Vital Signs BP (!) 139/94 (BP Location: Left Arm)   Pulse 98   Temp 98 F (36.7 C) (Oral)   Resp 18   Ht 5\' 10"  (1.778 m)   Wt 88.5 kg (195 lb)   SpO2 100%   BMI 27.98 kg/m   Physical Exam  Constitutional: He is oriented to person, place, and time. He appears well-developed and well-nourished.  HENT:  Head: Normocephalic and atraumatic.  Eyes: Pupils are equal, round, and reactive to light. Conjunctivae are normal.  Neck: Neck supple. No tracheal deviation present. No thyromegaly present.  Cardiovascular: Regular rhythm.  No murmur heard. Mildly tachycardic  Pulmonary/Chest: Effort normal and breath sounds normal.  Abdominal: Soft. Bowel sounds are normal. He exhibits no distension. There is no tenderness.  Musculoskeletal: Normal range of motion. He exhibits no edema or tenderness.  Neurological: He is alert and oriented to person, place, and time. No cranial nerve deficit. Coordination normal.  gait normal. Not lightheaded on standing  Skin: Skin is warm and dry. No rash noted.  Psychiatric: He has a normal mood and affect.  Nursing note and vitals reviewed.    ED Treatments / Results  Labs (all labs ordered are listed, but only abnormal results are displayed) Labs Reviewed  BASIC METABOLIC PANEL  CBC  TROPONIN I  D-DIMER, QUANTITATIVE (NOT AT [facility])  I-STAT TROPONIN, ED    EKG EKG  Interpretation  Date/Time:  Sunday Oct 05 2017 15:18:05 EDT Ventricular Rate:  121 PR Interval:    QRS Duration: 95 QT Interval:  336 QTC Calculation: 477 R Axis:   -35 Text Interpretation:  Sinus tachycardia Inferior infarct, old No significant change since last tracing Confirmed by Orlie Dakin [phone]) on 10/05/2017 3:29:08 PM   Radiology No results found.  Procedures Procedures (including critical care time)  Medications Ordered in ED Medications - No data to display  Results for orders placed or performed during the hospital encounter of 17/61/60  Basic metabolic panel  Result Value Ref Range   Sodium 140 135 - 145 mmol/L   Potassium 3.7 3.5 - 5.1 mmol/L   Chloride 104 101 - 111 mmol/L   CO2 26 22 - 32 mmol/L   Glucose, Bld 328 (H) 65 - 99  mg/dL   BUN 21 (H) 6 - 20 mg/dL   Creatinine, Ser 1.17 0.61 - 1.24 mg/dL   Calcium 9.5 8.9 - 10.3 mg/dL   GFR calc non Af Amer >60 >60 mL/min   GFR calc Af Amer >60 >60 mL/min   Anion gap 10 5 - 15  CBC  Result Value Ref Range   WBC 8.8 4.0 - 10.5 K/uL   RBC 5.45 4.22 - 5.81 MIL/uL   Hemoglobin 15.8 13.0 - 17.0 g/dL   HCT 48.1 39.0 - 52.0 %   MCV 88.3 78.0 - 100.0 fL   MCH 29.0 26.0 - 34.0 pg   MCHC 32.8 30.0 - 36.0 g/dL   RDW 13.1 11.5 - 15.5 %   Platelets 266 150 - 400 K/uL  Troponin I  Result Value Ref Range   Troponin I <0.03 <0.03 ng/mL  D-dimer, quantitative (not at [facility])  Result Value Ref Range   D-Dimer, Quant 0.42 0.00 - 0.50 ug/mL-FEU  I-stat troponin, ED  Result Value Ref Range   Troponin i, poc 0.00 0.00 - 0.08 ng/mL   Comment 3           Dg Chest 2 View  Result Date: 10/05/2017 CLINICAL DATA:  Patient with left-sided chest pain. EXAM: CHEST - 2 VIEW COMPARISON:  Chest radiograph 09/25/2017. FINDINGS: Monitoring leads overlie the patient. Stable cardiac and mediastinal contours. No consolidative pulmonary opacities. No pleural effusion or pneumothorax. Thoracic spine degenerative changes. IMPRESSION: No  acute cardiopulmonary process. Electronically Signed   By: Lovey Newcomer M.D.   On: 10/05/2017 16:12  chest x-ray viewed by me Initial Impression / Assessment and Plan / ED Course  I have reviewed the triage vital signs and the nursing notes.  Pertinent labs & imaging results that were available during my care of the patient were reviewed by me and considered in my medical decision making (see chart for details).     Patient has positive review of systems when prompted by my questioning for virtually every system. Including stating his hair hurts  Records from [place] sent to [place] by fax and reviewed by me. Patient had serial cardiac markers which was negative he was referred for an outpatient Lexiscan which he has not yet done. Records state that he had coronary stenting placed in 1996 at [facility]. Patient denies that he was stenting stating "we talked about doing a stent that they did not do it" unfortunately, epic does not to 61. I spoke with Dr. Caryl Comes, cardiologist by telephone who states that patient needs definitive testing such as coronary CT angiogram. He suggests 23 hour observation at [facility] under hospitalist service and consult cardiology tomorrow.  Pretest clinical suspicion for acute coronary syndrome is low.heart score equals 3 Pretest clinical suspicion forpulmonary embolism is low. Negative d-dimer.  4 5 PM he states the chest discomfort is minimal.after treatment with intravenous normal saline bolus  Consulted Dr.David will arrange for overnight hospitalization Lab work consistent with hyperglycemia, otherwise normal Final Clinical Impressions(s) / ED Diagnoses  Dx #1 chest pain at rest #2 hyperglycemia #3 syncope Final diagnoses:  None    ED Discharge Orders    None       Orlie Dakin, MD 10/05/17 1713    Orlie Dakin, MD 10/06/17 0001

## 2017-10-05 NOTE — H&P (Signed)
History and Physical    [name]:[id number] DOB: [birth year] DOA: 10/05/2017  PCP: Patient, No Pcp Per  Patient coming from: Home  Chief Complaint: Chest pain  HPI: [name] is a 57 y.o. male with medical history significant of hypertension, hyperlipidemia, diabetes, bipolar disorder comes in with over a month of exertional chest pain that is been occurring more frequently recently.  When the pain comes on it is exertional and lasts for several hours.  It is relieved with rest.  He denies any cough or fevers.  Patient reports he had a heart cath in 1996 and he was being medically managed no stents were placed.  He gets his care at the [place].  Denies any lower extremity edema.  Patient is being referred for admission for possible underlying ACS.   Review of Systems: As per HPI otherwise 10 point review of systems negative.   Past Medical History:  Diagnosis Date  . Anxiety   . Bipolar disorder ([place])   . Chronic pain   . Depression   . Diabetes mellitus without complication ([place])   . Diverticulitis   . GERD (gastroesophageal reflux disease)   . Hypertension   . IBS (irritable bowel syndrome)   . Kidney stone   . Migraine headache   . Neuropathy     Past Surgical History:  Procedure Laterality Date  . ANKLE SURGERY Right   . CARPAL TUNNEL RELEASE    . CHOLECYSTECTOMY    . COLONOSCOPY       reports that he has never smoked. He does not have any smokeless tobacco history on file. He reports that he has current or past drug history. He reports that he does not drink alcohol.  Allergies  Allergen Reactions  . Meloxicam Other (See Comments)    Acid Reflux   . Venlafaxine Other (See Comments)    Acid Reflux  . Penicillins Rash    History reviewed. No pertinent family history.  No premature coronary artery disease  Prior to Admission medications   Medication Sig Start Date End Date Taking? Authorizing Provider  amLODipine (NORVASC) 5 MG tablet  08/04/17  Yes  [provider]  DULoxetine (CYMBALTA) 30 MG capsule Take by mouth. 08/04/17  Yes [provider]  escitalopram (LEXAPRO) 20 MG tablet Take 20 mg by mouth daily.   Yes [provider]  insulin aspart (NOVOLOG) 100 UNIT/ML injection Inject into the skin.   Yes [provider]  insulin aspart protamine- aspart (NOVOLOG MIX 70/30) (70-30) 100 UNIT/ML injection Inject 50 Units into the skin 2 (two) times daily.   Yes [provider]  lisinopril (PRINIVIL,ZESTRIL) 20 MG tablet Take 20 mg by mouth daily.   Yes [provider]  lurasidone (LATUDA) 40 MG TABS tablet Take 40 mg by mouth daily with breakfast.   Yes [provider]  metFORMIN (GLUCOPHAGE) 1000 MG tablet Take 1,000 mg by mouth 3 (three) times daily.   Yes [provider]  metoprolol (LOPRESSOR) 100 MG tablet Take 100 mg by mouth 2 (two) times daily.   Yes [provider]  mirtazapine (REMERON) 15 MG tablet Take by mouth. 08/04/17  Yes [provider]  nortriptyline (PAMELOR) 75 MG capsule Take 75 mg by mouth 3 (three) times daily.   Yes [provider]  omeprazole (PRILOSEC) 20 MG capsule Take 20 mg by mouth daily.   Yes [provider]  pravastatin (PRAVACHOL) 10 MG tablet Take 10 mg by mouth at bedtime.  Yes [provider]  pregabalin (LYRICA) 200 MG capsule Take 200 mg by mouth 3 (three) times daily.   Yes [provider]  simvastatin (ZOCOR) 40 MG tablet Take by mouth. 08/04/17  Yes [provider]  vitamin B-12 (CYANOCOBALAMIN) 100 MCG tablet Take 100 mcg by mouth daily.   Yes [provider]  Vitamin D, Ergocalciferol, (DRISDOL) 50000 UNITS CAPS capsule Take 50,000 Units by mouth every 7 (seven) days. Usually takes on Wednesday   Yes [provider]    Physical Exam: Vitals:   10/05/17 1515 10/05/17 1600 10/05/17 1630 10/05/17 1705  BP: (!) 139/94 (!) 148/100 (!) 156/95 (!) 140/93    Pulse: 98 72 92 94  Resp: 18 17 (!) 29 12  Temp: 98 F (36.7 C)     TempSrc: Oral     SpO2: 100% 99% 99% 98%  Weight:      Height:          Constitutional: NAD, calm, comfortable Vitals:   10/05/17 1515 10/05/17 1600 10/05/17 1630 10/05/17 1705  BP: (!) 139/94 (!) 148/100 (!) 156/95 (!) 140/93  Pulse: 98 72 92 94  Resp: 18 17 (!) 29 12  Temp: 98 F (36.7 C)     TempSrc: Oral     SpO2: 100% 99% 99% 98%  Weight:      Height:       Eyes: PERRL, lids and conjunctivae normal ENMT: Mucous membranes are moist. Posterior pharynx clear of any exudate or lesions.Normal dentition.  Neck: normal, supple, no masses, no thyromegaly Respiratory: clear to auscultation bilaterally, no wheezing, no crackles. Normal respiratory effort. No accessory muscle use.  Cardiovascular: Regular rate and rhythm, no murmurs / rubs / gallops. No extremity edema. 2+ pedal pulses. No carotid bruits.  Abdomen: no tenderness, no masses palpated. No hepatosplenomegaly. Bowel sounds positive.  Musculoskeletal: no clubbing / cyanosis. No joint deformity upper and lower extremities. Good ROM, no contractures. Normal muscle tone.  Skin: no rashes, lesions, ulcers. No induration Neurologic: CN 2-12 grossly intact. Sensation intact, DTR normal. Strength 5/5 in all 4.  Psychiatric: Normal judgment and insight. Alert and oriented x 3. Normal mood.    Labs on Admission: I have personally reviewed following labs and imaging studies  CBC: Recent Labs  Lab 10/05/17 1541  WBC 8.8  HGB 15.8  HCT 48.1  MCV 88.3  PLT 400   Basic Metabolic Panel: Recent Labs  Lab 10/05/17 1541  NA 140  K 3.7  CL 104  CO2 26  GLUCOSE 328*  BUN 21*  CREATININE 1.17  CALCIUM 9.5   GFR: Estimated Creatinine Clearance: 76.2 mL/min (by C-G formula based on SCr of 1.17 mg/dL). Liver Function Tests: No results for input(s): AST, ALT, ALKPHOS, BILITOT, PROT, ALBUMIN in the last 168 hours. No results for input(s): LIPASE,  AMYLASE in the last 168 hours. No results for input(s): AMMONIA in the last 168 hours. Coagulation Profile: No results for input(s): INR, PROTIME in the last 168 hours. Cardiac Enzymes: Recent Labs  Lab 10/05/17 1541  TROPONINI <0.03   BNP (last 3 results) No results for input(s): PROBNP in the last 8760 hours. HbA1C: No results for input(s): HGBA1C in the last 72 hours. CBG: No results for input(s): GLUCAP in the last 168 hours. Lipid Profile: No results for input(s): CHOL, HDL, LDLCALC, TRIG, CHOLHDL, LDLDIRECT in the last 72 hours. Thyroid Function Tests: No results for input(s): TSH, T4TOTAL, FREET4, T3FREE, THYROIDAB in the last 72 hours. Anemia Panel: No  results for input(s): VITAMINB12, FOLATE, FERRITIN, TIBC, IRON, RETICCTPCT in the last 72 hours. Urine analysis:    Component Value Date/Time   COLORURINE YELLOW 12/04/2014 [place] 12/04/2014 1551   LABSPEC <1.005 (L) 12/04/2014 1551   PHURINE 6.0 12/04/2014 1551   GLUCOSEU >1000 (A) 12/04/2014 1551   HGBUR NEGATIVE 12/04/2014 1551   BILIRUBINUR NEGATIVE 12/04/2014 1551   KETONESUR 40 (A) 12/04/2014 1551   PROTEINUR NEGATIVE 12/04/2014 1551   UROBILINOGEN 0.2 12/04/2014 1551   NITRITE NEGATIVE 12/04/2014 1551   LEUKOCYTESUR NEGATIVE 12/04/2014 1551   Sepsis Labs: !!!!!!!!!!!!!!!!!!!!!!!!!!!!!!!!!!!!!!!!!!!! @LABRCNTIP (procalcitonin:4,lacticidven:4) )No results found for this or any previous visit (from the past 240 hour(s)).   Radiological Exams on Admission: Dg Chest 2 View  Result Date: 10/05/2017 CLINICAL DATA:  Patient with left-sided chest pain. EXAM: CHEST - 2 VIEW COMPARISON:  Chest radiograph 09/25/2017. FINDINGS: Monitoring leads overlie the patient. Stable cardiac and mediastinal contours. No consolidative pulmonary opacities. No pleural effusion or pneumothorax. Thoracic spine degenerative changes. IMPRESSION: No acute cardiopulmonary process. Electronically Signed   By: Lovey Newcomer M.D.    On: 10/05/2017 16:12    EKG: Independently reviewed.  Sinus tachycardia no acute changes  Old chart reviewed  Case discussed with Dr. Cathleen Fears in the ED  Chest x-ray reviewed no edema or infiltrate  Assessment/Plan 57 year old male with worrisome chest pain with a history of hypertension hyperlipidemia diabetes Principal Problem:   Chest pain-EKG nonischemic and troponin negative.  We will also patient on telemetry and obtain a cardiology consult for further evaluation for stress testing in the morning.  We will keep n.p.o. after midnight.  Patient on aspirin, beta-blocker and statin.  Currently chest pain-free.  Active Problems:   Diabetes mellitus with neuropathy (HCC)-continue sliding scale insulin   Hypertension-continue home meds   Bipolar disorder (HCC)-continue home meds     DVT prophylaxis: SCDs Code Status: Full Family Communication: None Disposition Plan: Per day team Consults called: Cardiology  Admission status: Observation   [name] A MD [facility]  If 7PM-7AM, please contact night-coverage [email or web address] Password [email or web address]  10/05/2017, 5:25 PM

## 2017-10-05 NOTE — ED Triage Notes (Signed)
Pt reports cleaning out his deceased parents house today and began having chest pain with diaphoresis and vomiting.  Given Aspirin 324mg  with Nitro x1 SL.  States Nitro lowered blood pressure from 323F systolic to 57D systolic.  Pt diaphoretic on arrival to ED.

## 2017-10-05 NOTE — ED Notes (Signed)
Report given to [facility] on 300.

## 2017-10-05 NOTE — Progress Notes (Signed)
Denies chest pain but complained of shortness of breath on exertion. Stated that due to neuropathy has had several falls lately. Bed alarm set and asked to call for help when he needs to get up  Vitals stable and denies nausea at this point

## 2017-10-06 ENCOUNTER — Observation Stay (HOSPITAL_BASED_OUTPATIENT_CLINIC_OR_DEPARTMENT_OTHER): Payer: Medicare Other

## 2017-10-06 ENCOUNTER — Encounter (HOSPITAL_COMMUNITY): Payer: Self-pay | Admitting: Physician Assistant

## 2017-10-06 DIAGNOSIS — I340 Nonrheumatic mitral (valve) insufficiency: Secondary | ICD-10-CM

## 2017-10-06 DIAGNOSIS — E1340 Other specified diabetes mellitus with diabetic neuropathy, unspecified: Secondary | ICD-10-CM | POA: Diagnosis not present

## 2017-10-06 DIAGNOSIS — F319 Bipolar disorder, unspecified: Principal | ICD-10-CM

## 2017-10-06 DIAGNOSIS — R079 Chest pain, unspecified: Secondary | ICD-10-CM

## 2017-10-06 DIAGNOSIS — E119 Type 2 diabetes mellitus without complications: Secondary | ICD-10-CM

## 2017-10-06 DIAGNOSIS — E785 Hyperlipidemia, unspecified: Secondary | ICD-10-CM

## 2017-10-06 DIAGNOSIS — Z794 Long term (current) use of insulin: Secondary | ICD-10-CM | POA: Diagnosis not present

## 2017-10-06 DIAGNOSIS — Z8673 Personal history of transient ischemic attack (TIA), and cerebral infarction without residual deficits: Secondary | ICD-10-CM | POA: Diagnosis not present

## 2017-10-06 DIAGNOSIS — I10 Essential (primary) hypertension: Secondary | ICD-10-CM

## 2017-10-06 LAB — GLUCOSE, CAPILLARY
GLUCOSE-CAPILLARY: 57 mg/dL — AB (ref 65–99)
GLUCOSE-CAPILLARY: 71 mg/dL (ref 65–99)
GLUCOSE-CAPILLARY: 170 mg/dL — AB (ref 65–99)
Glucose-Capillary: 104 mg/dL — ABNORMAL HIGH (ref 65–99)
Glucose-Capillary: 162 mg/dL — ABNORMAL HIGH (ref 65–99)
Glucose-Capillary: 165 mg/dL — ABNORMAL HIGH (ref 65–99)

## 2017-10-06 LAB — NM MYOCAR MULTI W/SPECT W/WALL MOTION / EF
CHL CUP RESTING HR STRESS: 61 {beats}/min
CSEPPHR: 72 {beats}/min
LV dias vol: 115 mL (ref 62–150)
LVSYSVOL: 73 mL
RATE: 0.37
SDS: 3
SRS: 2
SSS: 5
TID: 0.91

## 2017-10-06 LAB — HEMOGLOBIN A1C
HEMOGLOBIN A1C: 8.3 % — AB (ref 4.8–5.6)
Mean Plasma Glucose: 191.51 mg/dL

## 2017-10-06 LAB — ECHOCARDIOGRAM COMPLETE
HEIGHTINCHES: 70 in
WEIGHTICAEL: 3120 [oz_av]

## 2017-10-06 MED ORDER — TECHNETIUM TC 99M TETROFOSMIN IV KIT
30.0000 | PACK | Freq: Once | INTRAVENOUS | Status: AC | PRN
Start: 2017-10-06 — End: 2017-10-06
  Administered 2017-10-06: 30 via INTRAVENOUS

## 2017-10-06 MED ORDER — INSULIN ASPART PROT & ASPART (70-30 MIX) 100 UNIT/ML ~~LOC~~ SUSP
30.0000 [IU] | Freq: Two times a day (BID) | SUBCUTANEOUS | Status: DC
  Administered 2017-10-07: 30 [IU] via SUBCUTANEOUS
  Filled 2017-10-06: qty 10

## 2017-10-06 MED ORDER — DEXTROSE 50 % IV SOLN
INTRAVENOUS | Status: AC
  Administered 2017-10-06: 50 mL
  Filled 2017-10-06: qty 50

## 2017-10-06 MED ORDER — SODIUM CHLORIDE 0.9% FLUSH
INTRAVENOUS | Status: AC
  Administered 2017-10-06: 10 mL via INTRAVENOUS
  Filled 2017-10-06: qty 10

## 2017-10-06 MED ORDER — ATORVASTATIN CALCIUM 40 MG PO TABS
40.0000 mg | ORAL_TABLET | Freq: Every day | ORAL | Status: DC
  Administered 2017-10-06: 40 mg via ORAL
  Filled 2017-10-06: qty 1

## 2017-10-06 MED ORDER — INSULIN ASPART 100 UNIT/ML ~~LOC~~ SOLN
0.0000 [IU] | Freq: Three times a day (TID) | SUBCUTANEOUS | Status: DC
  Administered 2017-10-07 (×2): 8 [IU] via SUBCUTANEOUS

## 2017-10-06 MED ORDER — INSULIN ASPART 100 UNIT/ML ~~LOC~~ SOLN
0.0000 [IU] | Freq: Every day | SUBCUTANEOUS | Status: DC
Start: 2017-10-06 — End: 2017-10-07

## 2017-10-06 MED ORDER — ISOSORBIDE MONONITRATE ER 60 MG PO TB24
30.0000 mg | ORAL_TABLET | Freq: Every day | ORAL | Status: DC
  Administered 2017-10-06 – 2017-10-07 (×2): 30 mg via ORAL
  Filled 2017-10-06 (×2): qty 1

## 2017-10-06 MED ORDER — DEXTROSE 10 % IV SOLN
INTRAVENOUS | Status: DC
  Administered 2017-10-06: 11:00:00 via INTRAVENOUS

## 2017-10-06 MED ORDER — REGADENOSON 0.4 MG/5ML IV SOLN
INTRAVENOUS | Status: AC
  Administered 2017-10-06: 0.4 mg via INTRAVENOUS
  Filled 2017-10-06: qty 5

## 2017-10-06 MED ORDER — TECHNETIUM TC 99M TETROFOSMIN IV KIT
10.0000 | PACK | Freq: Once | INTRAVENOUS | Status: AC | PRN
  Administered 2017-10-06: 8 via INTRAVENOUS

## 2017-10-06 MED ORDER — REGADENOSON 0.4 MG/5ML IV SOLN
0.4000 mg | Freq: Once | INTRAVENOUS | Status: AC
  Administered 2017-10-06: 0.4 mg via INTRAVENOUS
  Filled 2017-10-06: qty 5

## 2017-10-06 NOTE — Progress Notes (Signed)
PROGRESS NOTE    [name]  GNF:[id number]  DOB: 08/15/1960  DOA: 10/05/2017 PCP: Patient, No Pcp Per   Brief Admission Hx: [name] is a 57 y.o. male with medical history significant of hypertension, hyperlipidemia, diabetes, bipolar disorder comes in with over a month of exertional chest pain that is been occurring more frequently recently.  When the pain comes on it is exertional and lasts for several hours.  It is relieved with rest.  He denies any cough or fevers.  Patient reports he had a heart cath in 1996 and he was being medically managed no stents were placed.  He gets his care at the [place].  MDM/Assessment & Plan:   1. Chest pain - troponins have been negative but with his strong family history of CAD we have asked for cardiology team to evaluate him for possible stress testing.  Await recommendations of cardiology team.  Continue aspirin, beta blocker, statin, NTG.   2. 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  He was given 50 units of 70/30 insulin at 9pm.  He was given 1/2 amp D50.  Because he is NPO for stress testing, I will start him on a dextrose 10 infusion until he can start eating again.  3. Bipolar disorder - stable, resumed home medications.      DVT prophylaxis: SCDs Code Status: Full  Family Communication: patient  Disposition Plan: pending stress testing, cardiology consult  Subjective: Pt is reporting that he has chest pain and palpitations, anxiety and shaking from low blood sugar.    Objective: Vitals:   10/05/17 2131 10/05/17 2237 10/06/17 0212 10/06/17 0613  BP: (!) 142/98 (!) 142/93 127/73 122/69  Pulse: 98 79 76 62  Resp:  20 16 16   Temp:  98.3 F (36.8 C) 97.9 F (36.6 C) (!) 97.5 F (36.4 C)  TempSrc:  Oral Oral Oral  SpO2:  98% 99% 100%  Weight:      Height:        Intake/Output Summary (Last 24 hours) at 10/06/2017 0947 Last data filed at 10/05/2017 1632 Gross per 24 hour  Intake 1000 ml    Output -  Net 1000 ml   Filed Weights   10/05/17 1513  Weight: 88.5 kg (195 lb)   REVIEW OF SYSTEMS  As per history otherwise all reviewed and reported negative  Exam:  General exam: awake, alert, NAD, cooperative.  Respiratory system: Clear. No increased work of breathing. Cardiovascular system: S1 & S2 heard, RRR. No JVD, murmurs, gallops, clicks or pedal edema. Gastrointestinal system: Abdomen is nondistended, soft and nontender. Normal bowel sounds heard. Central nervous system: Alert and oriented. No focal neurological deficits. Extremities: no CCE.  Data Reviewed: Basic Metabolic Panel: Recent Labs  Lab 10/05/17 1541  NA 140  K 3.7  CL 104  CO2 26  GLUCOSE 328*  BUN 21*  CREATININE 1.17  CALCIUM 9.5   Liver Function Tests: No results for input(s): AST, ALT, ALKPHOS, BILITOT, PROT, ALBUMIN in the last 168 hours. No results for input(s): LIPASE, AMYLASE in the last 168 hours. No results for input(s): AMMONIA in the last 168 hours. CBC: Recent Labs  Lab 10/05/17 1541  WBC 8.8  HGB 15.8  HCT 48.1  MCV 88.3  PLT 266   Cardiac Enzymes: Recent Labs  Lab 10/05/17 1541 10/05/17 1741 10/05/17 2050 10/05/17 2322  TROPONINI <0.03 <0.03 <0.03 <0.03   CBG (last 3)  Recent Labs  10/05/17 2102 10/06/17 0726 10/06/17 0816  GLUCAP 173* 57* 104*   No results found for this or any previous visit (from the past 240 hour(s)).   Studies: Dg Chest 2 View  Result Date: 10/05/2017 CLINICAL DATA:  Patient with left-sided chest pain. EXAM: CHEST - 2 VIEW COMPARISON:  Chest radiograph 09/25/2017. FINDINGS: Monitoring leads overlie the patient. Stable cardiac and mediastinal contours. No consolidative pulmonary opacities. No pleural effusion or pneumothorax. Thoracic spine degenerative changes. IMPRESSION: No acute cardiopulmonary process. Electronically Signed   By: Lovey Newcomer M.D.   On: 10/05/2017 16:12   Scheduled Meds: . aspirin EC  325 mg Oral Daily  .  escitalopram  20 mg Oral Daily  . insulin aspart  0-9 Units Subcutaneous TID WC  . lisinopril  20 mg Oral Daily  . metoprolol tartrate  100 mg Oral BID  . nortriptyline  75 mg Oral TID  . pravastatin  10 mg Oral QHS  . pregabalin  200 mg Oral TID  . regadenoson  0.4 mg Intravenous Once  . vitamin B-12  100 mcg Oral Daily  . [START ON 10/11/2017] Vitamin D (Ergocalciferol)  50,000 Units Oral Q7 days   Continuous Infusions: . dextrose      Principal Problem:   Chest pain Active Problems:   Diabetes mellitus with neuropathy ([place])   Hypertension   Bipolar disorder ([place])  Time spent:   Irwin Brakeman, MD, FAAFP [facility] Pager [phone]  If 7PM-7AM, please contact night-coverage [email or web address] Password [email or web address] 10/06/2017, 9:47 AM    LOS: 0 days

## 2017-10-06 NOTE — Progress Notes (Signed)
*  PRELIMINARY RESULTS* Echocardiogram 2D Echocardiogram has been performed.  Samuel Germany 10/06/2017, 2:04 PM

## 2017-10-06 NOTE — Care Management Obs Status (Signed)
[place] NOTIFICATION   Patient Details  Name: CHENEY EWART MRN: [id number] Date of Birth: [birth year]   Medicare Observation Status Notification Given:  Yes    Sherald Barge, RN 10/06/2017, 1:49 PM

## 2017-10-06 NOTE — Consult Note (Addendum)
Cardiology Consultation:   Patient ID: [name]; [id number]; 02-14-1961   Admit date: 10/05/2017 Date of Consult: 10/06/2017  Primary Care Provider: Patient, No Pcp Per Primary Cardiologist: New, Dr. Bronson Ing Primary Electrophysiologist: N/A   Patient Profile:   [name] is a 57 y.o. male with a hx of chest pain who is being seen today for the evaluation of chest pain at the request of Dr. Wynetta Emery.  History of Present Illness:   [name] is a 57 year old male patient who has a history of hypertension, hyperlipidemia diabetes mellitus, bipolar disorder, prior cardiac catheterization in 1996 treated medically.  He is followed at the [facility].  Patient was admitted yesterday with 1 month history of progressive chest pain.  EKG sinus tachycardia at 121 bpm without acute change troponins negative x3. 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 Dolores Lory neurologist at [facility] on statin & ASA. Complains of dull chest pain into neck assoc with shortness of breath with little activity. Tried mowing lawn 2 days ago and had to stop. Has terrible neuropathy and falls a lot. Mother had an MI 78, died 45 COPD, Brother died MI 71. Patient was incarcerated for 11 months but then cleared of all charges. Had CVA in jail.  Past Medical History:  Diagnosis Date  . Anxiety   . Bipolar disorder ([place])   . Chronic pain   . Depression   . Diabetes mellitus without complication ([place])   . Diverticulitis   . GERD (gastroesophageal reflux disease)   . Hypertension   . IBS (irritable bowel syndrome)   . Kidney stone   . Migraine headache   . Neuropathy     Past Surgical History:  Procedure Laterality Date  . ANKLE SURGERY Right   . CARPAL TUNNEL RELEASE    . CHOLECYSTECTOMY    . COLONOSCOPY       Home Medications:  Prior to Admission medications   Medication Sig Start Date End Date Taking? Authorizing Provider    amLODipine (NORVASC) 5 MG tablet  08/04/17  Yes [provider]  DULoxetine (CYMBALTA) 30 MG capsule Take by mouth. 08/04/17  Yes [provider]  escitalopram (LEXAPRO) 20 MG tablet Take 20 mg by mouth daily.   Yes [provider]  insulin aspart (NOVOLOG) 100 UNIT/ML injection Inject into the skin.   Yes [provider]  insulin aspart protamine- aspart (NOVOLOG MIX 70/30) (70-30) 100 UNIT/ML injection Inject 50 Units into the skin 2 (two) times daily.   Yes [provider]  lisinopril (PRINIVIL,ZESTRIL) 20 MG tablet Take 20 mg by mouth daily.   Yes [provider]  lurasidone (LATUDA) 40 MG TABS tablet Take 40 mg by mouth daily with breakfast.   Yes [provider]  metFORMIN (GLUCOPHAGE) 1000 MG tablet Take 1,000 mg by mouth 3 (three) times daily.   Yes [provider]  metoprolol (LOPRESSOR) 100 MG tablet Take 100 mg by mouth 2 (two) times daily.   Yes [provider]  mirtazapine (REMERON) 15 MG tablet Take by mouth. 08/04/17  Yes [provider]  nortriptyline (PAMELOR) 75 MG capsule Take 75 mg by mouth 3 (three) times daily.   Yes [provider]  omeprazole (PRILOSEC) 20 MG capsule Take 20 mg by mouth daily.   Yes [provider]  pravastatin (PRAVACHOL) 10 MG tablet Take 10 mg by mouth at bedtime.    Yes [provider]  pregabalin (LYRICA) 200 MG capsule Take 200 mg by mouth 3 (three) times daily.   Yes [provider]  simvastatin (ZOCOR) 40 MG tablet Take by mouth. 08/04/17  Yes [provider]  vitamin B-12 (CYANOCOBALAMIN) 100 MCG tablet Take 100 mcg by mouth daily.   Yes [provider]  Vitamin D, Ergocalciferol, (DRISDOL) 50000 UNITS CAPS capsule Take 50,000 Units by mouth every 7 (seven) days. Usually takes on Wednesday   Yes [provider]    Inpatient Medications: Scheduled Meds: . aspirin EC  325 mg Oral Daily  .  escitalopram  20 mg Oral Daily  . insulin aspart  0-9 Units Subcutaneous TID WC  . lisinopril  20 mg Oral Daily  . metoprolol tartrate  100 mg Oral BID  . nortriptyline  75 mg Oral TID  . pravastatin  10 mg Oral QHS  . pregabalin  200 mg Oral TID  . regadenoson  0.4 mg Intravenous Once  . vitamin B-12  100 mcg Oral Daily  . [START ON 10/11/2017] Vitamin D (Ergocalciferol)  50,000 Units Oral Q7 days   Continuous Infusions: . dextrose     PRN Meds: acetaminophen, gi cocktail, morphine injection, ondansetron (ZOFRAN) IV  Allergies:    Allergies  Allergen Reactions  . Meloxicam Other (See Comments)    Acid Reflux   . Venlafaxine Other (See Comments)    Acid Reflux  . Penicillins Rash    Social History:   Social History   Socioeconomic History  . Marital status: Single    Spouse name: Not on file  . Number of children: Not on file  . Years of education: Not on file  . Highest education level: Not on file  Occupational History  . Not on file  Social Needs  . Financial resource strain: Not on file  . Food insecurity:    Worry: Not on file    Inability: Not on file  . Transportation needs:    Medical: Not on file    Non-medical: Not on file  Tobacco Use  . Smoking status: Never Smoker  Substance and Sexual Activity  . Alcohol use: No  . Drug use: Not Currently  . Sexual activity: Not on file  Lifestyle  . Physical activity:    Days per week: Not on file    Minutes per session: Not on file  . Stress: Not on file  Relationships  . Social connections:    Talks on phone: Not on file    Gets together: Not on file    Attends religious service: Not on file    Active member of club or organization: Not on file    Attends meetings of clubs or organizations: Not on file    Relationship status: Not on file  . Intimate partner violence:    Fear of current or ex partner: Not on file    Emotionally abused: Not on file    Physically abused: Not on file    Forced sexual  activity: Not on file  Other Topics Concern  . Not on file  Social History Narrative  . Not on file    Family History:   Family History  Problem Relation Age of Onset  . CAD Mother   . CAD Brother      ROS:  Please see the history of present illness.  Review of Systems  Constitution: Negative.  HENT: Negative.   Cardiovascular: Positive for chest pain.  Respiratory: Negative.   Endocrine: Negative.  Hematologic/Lymphatic: Negative.   Musculoskeletal: Positive for falls and muscle weakness.  Gastrointestinal: Negative.   Genitourinary: Negative.   Neurological: Positive for loss of balance and paresthesias.    All other ROS reviewed and negative.     Physical Exam/Data:   Vitals:   10/05/17 2131 10/05/17 2237 10/06/17 0212 10/06/17 0613  BP: (!) 142/98 (!) 142/93 127/73 122/69  Pulse: 98 79 76 62  Resp:  20 16 16   Temp:  98.3 F (36.8 C) 97.9 F (36.6 C) (!) 97.5 F (36.4 C)  TempSrc:  Oral Oral Oral  SpO2:  98% 99% 100%  Weight:      Height:        Intake/Output Summary (Last 24 hours) at 10/06/2017 0931 Last data filed at 10/05/2017 1632 Gross per 24 hour  Intake 1000 ml  Output -  Net 1000 ml   Filed Weights   10/05/17 1513  Weight: 195 lb (88.5 kg)   Body mass index is 27.98 kg/m.  General:  Well nourished, well developed, in no acute distress HEENT: normal Lymph: no adenopathy Neck: no JVD Endocrine:  No thryomegaly Vascular: No carotid bruits; FA pulses 2+ bilaterally without bruits  Cardiac:  normal S1, S2; RRR; no murmur  Lungs:  clear to auscultation bilaterally, no wheezing, rhonchi or rales  Abd: soft, nontender, no hepatomegaly  Ext: no edema Musculoskeletal:  No deformities, BUE and BLE strength normal and equal Skin: warm and dry  Neuro:  CNs 2-12 intact, no focal abnormalities noted Psych:  Normal affect   EKG:  The EKG was personally reviewed and demonstrates:  Sinus tachycardia no acute change Telemetry:  Telemetry was  personally reviewed and demonstrates:  NSR  Relevant CV Studies:    Laboratory Data:  Chemistry Recent Labs  Lab 10/05/17 1541  NA 140  K 3.7  CL 104  CO2 26  GLUCOSE 328*  BUN 21*  CREATININE 1.17  CALCIUM 9.5  GFRNONAA >60  GFRAA >60  ANIONGAP 10    No results for input(s): PROT, ALBUMIN, AST, ALT, ALKPHOS, BILITOT in the last 168 hours. Hematology Recent Labs  Lab 10/05/17 1541  WBC 8.8  RBC 5.45  HGB 15.8  HCT 48.1  MCV 88.3  MCH 29.0  MCHC 32.8  RDW 13.1  PLT 266   Cardiac Enzymes Recent Labs  Lab 10/05/17 1541 10/05/17 1741 10/05/17 2050 10/05/17 2322  TROPONINI <0.03 <0.03 <0.03 <0.03    Recent Labs  Lab 10/05/17 1547  TROPIPOC 0.00    BNPNo results for input(s): BNP, PROBNP in the last 168 hours.  DDimer  Recent Labs  Lab 10/05/17 1546  DDIMER 0.42    Radiology/Studies:  Dg Chest 2 View  Result Date: 10/05/2017 CLINICAL DATA:  Patient with left-sided chest pain. EXAM: CHEST - 2 VIEW COMPARISON:  Chest radiograph 09/25/2017. FINDINGS: Monitoring leads overlie the patient. Stable cardiac and mediastinal contours. No consolidative pulmonary opacities. No pleural effusion or pneumothorax. Thoracic spine degenerative changes. IMPRESSION: No acute cardiopulmonary process. Electronically Signed   By: Lovey Newcomer M.D.   On: 10/05/2017 16:12    Assessment and Plan:   1. Chest pain troponins negative x3 EKG without acute change. Symptoms worrisome for ischemia but with 2 recent strokes higher risk for cath. Multiple cardiac risk factors with strong family history of CAD. Discussed with Dr. Bronson Ing and will proceed with lexiscan today(falls a lot) 2. CVA x 2 past 6 months-followed at [place] 3. Strong family history of early CAD. 4. Essential hypertension 5.  Hyperlipidemia 6. Diabetes mellitus 7. Bipolar disorder   For questions or updates, please contact [place] Please consult [email or web address] for contact info under Cardiology/STEMI.     Signed, Ermalinda Barrios, PA-C  10/06/2017 9:31 AM   The patient was seen and examined, and I agree with the history, physical exam, assessment and plan as documented above, with modifications as noted below. I have also personally reviewed all relevant documentation, old records, labs, and both radiographic and cardiovascular studies. I have also independently interpreted old and new ECG's.  Briefly, this is a 57 year old gentleman with a past medical history significant for hypertension, hyperlipidemia, and insulin-dependent diabetes mellitus.    He tells me he has been experiencing exertional chest pain and dyspnea for the past 2 months.  He notices it with any strenuous exertion such as trying to mow the lawn earlier this week.   He also acknowledges awakening from sleep with a feeling of suffocation.  He has a history of 2 CVAs in the past 6 months.  He also has a history of significant lower extremity neuropathy.  Troponins have been normal.  ECG showed sinus tachycardia.  Chest x-ray showed no acute cardiopulmonary process.  Brother died of an MI at the age of 82.  Blood pressure is controlled.  Recommendations: Symptoms are suspicious for ischemic heart disease.  Given his history of falls, significant lower extremity neuropathy, and 2 recent strokes, I will proceed with a Lexiscan Myoview stress test.  Given symptoms suspicious for paroxysmal nocturnal dyspnea, I will also obtain an echocardiogram to evaluate cardiac structure and function.    He may have sleep disordered breathing, perhaps central sleep apnea given his history of recurrent CVA.  He may need an outpatient sleep study.  Continue aspirin and metoprolol.  Given his history of insulin depending diabetes mellitus and recurrent CVA, he should be on a higher potency statin. I will switch to atorvastatin 40 mg daily.   Kate Sable, MD, [facility]  10/06/2017 9:59 AM  Addendum:  I reviewed the stress test and  echocardiogram.  There were no large ischemic territories with nuclear stress testing.  Echocardiogram demonstrated low normal to mildly reduced left ventricular systolic function, LVEF 09-23%.  I will start Imdur 30 mg daily.  I will arrange for outpatient follow-up.  This has been communicated with internal medicine.

## 2017-10-06 NOTE — Progress Notes (Signed)
Inpatient Diabetes Program Recommendations  AACE/ADA: New Consensus Statement on Inpatient Glycemic Control (2015)  Target Ranges:  Prepandial:   less than 140 mg/dL      Peak postprandial:   less than 180 mg/dL (1-2 hours)      Critically ill patients:  140 - 180 mg/dL   Results for [name], [name] (MRN [id number]) as of 10/06/2017 07:37  Ref. Range 10/05/2017 18:40 10/05/2017 21:02 10/06/2017 07:26  Glucose-Capillary Latest Ref Range: 65 - 99 mg/dL 215 (H) 173 (H) 57 (L)   Review of Glycemic Control  Diabetes history: DM2 Outpatient Diabetes medications: 70/30 50 units BID, Metformin 1000 mg TID, Novolog as needed Current orders for Inpatient glycemic control: 70/30 50 units BID, Novolog 0-9 units TID with meals  Inpatient Diabetes Program Recommendations: Insulin - Basal: Patient received 70/30 50 units at 21:31 on 10/05/17 and fasting glucose is 57 mg/dl this morning. Noted 70/30  is currently ordered to be given at 10 am and 10pm. 70/30 should be given with meals. Please consider decreasing 70/30 to 30 units BID (to be given at 8am and 5pm with breakfast and supper). Insulin-Correction: Please consider ordering Novolog 0-5 units QHS for bedtime correction scale.  Thanks, Barnie Alderman, RN, MSN, CDE Diabetes Coordinator Inpatient Diabetes Program [phone] (Team Pager from 8am to 5pm)

## 2017-10-07 DIAGNOSIS — E1340 Other specified diabetes mellitus with diabetic neuropathy, unspecified: Secondary | ICD-10-CM | POA: Diagnosis not present

## 2017-10-07 DIAGNOSIS — F319 Bipolar disorder, unspecified: Secondary | ICD-10-CM | POA: Diagnosis not present

## 2017-10-07 DIAGNOSIS — R079 Chest pain, unspecified: Secondary | ICD-10-CM | POA: Diagnosis not present

## 2017-10-07 DIAGNOSIS — I10 Essential (primary) hypertension: Secondary | ICD-10-CM | POA: Diagnosis not present

## 2017-10-07 LAB — GLUCOSE, CAPILLARY
Glucose-Capillary: 226 mg/dL — ABNORMAL HIGH (ref 65–99)
Glucose-Capillary: 267 mg/dL — ABNORMAL HIGH (ref 65–99)
Glucose-Capillary: 284 mg/dL — ABNORMAL HIGH (ref 65–99)

## 2017-10-07 MED ORDER — ATORVASTATIN CALCIUM 40 MG PO TABS: 40.0000 mg | ORAL_TABLET | Freq: Every day | ORAL | 0 refills | Status: DC

## 2017-10-07 MED ORDER — HYDROXYZINE HCL 25 MG PO TABS
50.0000 mg | ORAL_TABLET | Freq: Four times a day (QID) | ORAL | Status: DC | PRN
Start: 2017-10-07 — End: 2017-10-07
  Administered 2017-10-07: 50 mg via ORAL
  Filled 2017-10-07: qty 2

## 2017-10-07 MED ORDER — ASPIRIN 325 MG PO TBEC: 325.0000 mg | DELAYED_RELEASE_TABLET | Freq: Every day | ORAL | 0 refills | Status: DC

## 2017-10-07 MED ORDER — ISOSORBIDE MONONITRATE ER 30 MG PO TB24: 30.0000 mg | ORAL_TABLET | Freq: Every day | ORAL | 0 refills | Status: DC

## 2017-10-07 NOTE — Care Management (Signed)
Observation for r/o CP. Pt from home, active with [facility]. [facility] rep aware of admission. Pt will DC back home today. [facility] rep aware. Pt does not need order to resume services.

## 2017-10-07 NOTE — Discharge Summary (Signed)
Physician Discharge Summary  NAM VOSSLER POE:[id number] DOB: May 19, 1961 DOA: 10/05/2017  PCP: Patient, No Pcp Per  Admit date: 10/05/2017 Discharge date: 10/07/2017  Admitted From: Home Disposition: Home  Recommendations for Outpatient Follow-up:  1. Follow up with PCP in 1-2 weeks 2. Please obtain BMP/CBC in one week 3. Follow-up with cardiology will be arranged   Home Health Equipment/Devices:  Discharge Condition:Stable CODE STATUS: Full code Diet recommendation: Heart Healthy / Carb Modified  Brief/Interim Summary: 57 year old male with history of hypertension, diabetes and bipolar disorder, presents to the hospital with complaints of chest pain.  There was concern for underlying ischemic etiology he was seen by cardiology.  Patient underwent stress test as well as echocardiogram.  Stress test did not indicate any large areas of her vertebral ischemia.  Echocardiogram showed mildly depressed ejection fraction of 45 to 50%.  Patient was continued on his cardiac medications and Imdur was added.  Statin was changed to Lipitor per cardiology.  The patient is feeling significantly better.  He is no longer having any chest discomfort.  Be scheduled for follow-up with cardiology.  Diabetes/bipolar medications have been unchanged.  Norvasc discontinued due to low normal blood pressure.  He is otherwise stable for discharge.  Discharge Diagnoses:  Principal Problem:   Chest pain Active Problems:   Diabetes mellitus with neuropathy ([place])   Hypertension   Bipolar disorder [facility])    Discharge Instructions  Discharge Instructions    Diet - low sodium heart healthy   Complete by:  As directed    Increase activity slowly   Complete by:  As directed      Allergies as of 10/07/2017      Reactions   Meloxicam Other (See Comments)   Acid Reflux    Venlafaxine Other (See Comments)   Acid Reflux   Penicillins Rash      Medication List    STOP taking these medications   amLODipine  5 MG tablet Commonly known as:  NORVASC   pravastatin 10 MG tablet Commonly known as:  PRAVACHOL   simvastatin 40 MG tablet Commonly known as:  ZOCOR     TAKE these medications   aspirin 325 MG EC tablet Take 1 tablet (325 mg total) by mouth daily. Start taking on:  10/08/2017   atorvastatin 40 MG tablet Commonly known as:  LIPITOR Take 1 tablet (40 mg total) by mouth daily at 6 PM.   DULoxetine 30 MG capsule Commonly known as:  CYMBALTA Take by mouth.   escitalopram 20 MG tablet Commonly known as:  LEXAPRO Take 20 mg by mouth daily.   insulin aspart 100 UNIT/ML injection Commonly known as:  novoLOG Inject into the skin.   insulin aspart protamine- aspart (70-30) 100 UNIT/ML injection Commonly known as:  NOVOLOG MIX 70/30 Inject 50 Units into the skin 2 (two) times daily.   isosorbide mononitrate 30 MG 24 hr tablet Commonly known as:  IMDUR Take 1 tablet (30 mg total) by mouth daily. Start taking on:  10/08/2017   lisinopril 20 MG tablet Commonly known as:  PRINIVIL,ZESTRIL Take 20 mg by mouth daily.   lurasidone 40 MG Tabs tablet Commonly known as:  LATUDA Take 40 mg by mouth daily with breakfast.   metFORMIN 1000 MG tablet Commonly known as:  GLUCOPHAGE Take 1,000 mg by mouth 3 (three) times daily.   metoprolol tartrate 100 MG tablet Commonly known as:  LOPRESSOR Take 100 mg by mouth 2 (two) times daily.   mirtazapine 15 MG tablet  Commonly known as:  REMERON Take by mouth.   nortriptyline 75 MG capsule Commonly known as:  PAMELOR Take 75 mg by mouth 3 (three) times daily.   omeprazole 20 MG capsule Commonly known as:  PRILOSEC Take 20 mg by mouth daily.   pregabalin 200 MG capsule Commonly known as:  LYRICA Take 200 mg by mouth 3 (three) times daily.   vitamin B-12 100 MCG tablet Commonly known as:  CYANOCOBALAMIN Take 100 mcg by mouth daily.   Vitamin D (Ergocalciferol) 50000 units Caps capsule Commonly known as:  DRISDOL Take 50,000  Units by mouth every 7 (seven) days. Usually takes on Wednesday      Follow-up Information    Erma Heritage, [place] On 11/12/2018.   Specialties:  Physician Assistant, Cardiology Why:  at 1:00 pm Contact information: [place] [address] [phone]          Allergies  Allergen Reactions  . Meloxicam Other (See Comments)    Acid Reflux   . Venlafaxine Other (See Comments)    Acid Reflux  . Penicillins Rash    Consultations:  Cardiology   Procedures/Studies: Dg Chest 2 View  Result Date: 10/05/2017 CLINICAL DATA:  Patient with left-sided chest pain. EXAM: CHEST - 2 VIEW COMPARISON:  Chest radiograph 09/25/2017. FINDINGS: Monitoring leads overlie the patient. Stable cardiac and mediastinal contours. No consolidative pulmonary opacities. No pleural effusion or pneumothorax. Thoracic spine degenerative changes. IMPRESSION: No acute cardiopulmonary process. Electronically Signed   By: Lovey Newcomer M.D.   On: 10/05/2017 16:12   Nm Myocar Multi W/spect W/wall Motion / Ef  Result Date: 10/06/2017  There was no ST segment deviation noted during stress.  Defect 1: There is a medium defect of mild severity present in the mid anterior, mid inferior, apical anterior and apical inferior location. This may be related to myocardial scar, although soft tissue attenuation may also be contributing to the defects. No large ischemic territories.  This is an intermediate risk study primarly based upon LVEF reduction.  Nuclear stress EF: 37%.     Echo: - Left ventricle: The cavity size was normal. Wall thickness was   increased in a pattern of mild LVH. Systolic function was low   normal to mildly reduced. The estimated ejection fraction was in   the range of 45% to 50%. Diffuse hypokinesis. Doppler parameters   are consistent with abnormal left ventricular relaxation (grade 1   diastolic dysfunction). Doppler parameters are consistent with   high ventricular filling  pressure. - Mitral valve: There was mild regurgitation. - Right ventricle: Systolic function was reduced. - Atrial septum: No defect or patent foramen ovale was identified.   Subjective: No chest pain or shortness of breath.  Discharge Exam: Vitals:   10/07/17 0501 10/07/17 1332  BP: 117/73 91/61  Pulse: 80 67  Resp: 18 20  Temp: 98.2 F (36.8 C) 98.2 F (36.8 C)  SpO2: 94% 91%   Vitals:   10/06/17 2034 10/07/17 0003 10/07/17 0501 10/07/17 1332  BP: (!) 93/52  117/73 91/61  Pulse: 64  80 67  Resp: 20  18 20   Temp: 98.4 F (36.9 C)  98.2 F (36.8 C) 98.2 F (36.8 C)  TempSrc: Oral  Oral Oral  SpO2:  96% 94% 91%  Weight:      Height:        General: Pt is alert, awake, not in acute distress Cardiovascular: RRR, S1/S2 +, no rubs, no gallops Respiratory: CTA bilaterally, no  wheezing, no rhonchi Abdominal: Soft, NT, ND, bowel sounds + Extremities: no edema, no cyanosis    The results of significant diagnostics from this hospitalization (including imaging, microbiology, ancillary and laboratory) are listed below for reference.     Microbiology: No results found for this or any previous visit (from the past 240 hour(s)).   Labs: BNP (last 3 results) No results for input(s): BNP in the last 8760 hours. Basic Metabolic Panel: Recent Labs  Lab 10/05/17 1541  NA 140  K 3.7  CL 104  CO2 26  GLUCOSE 328*  BUN 21*  CREATININE 1.17  CALCIUM 9.5   Liver Function Tests: No results for input(s): AST, ALT, ALKPHOS, BILITOT, PROT, ALBUMIN in the last 168 hours. No results for input(s): LIPASE, AMYLASE in the last 168 hours. No results for input(s): AMMONIA in the last 168 hours. CBC: Recent Labs  Lab 10/05/17 1541  WBC 8.8  HGB 15.8  HCT 48.1  MCV 88.3  PLT 266   Cardiac Enzymes: Recent Labs  Lab 10/05/17 1541 10/05/17 1741 10/05/17 2050 10/05/17 2322  TROPONINI <0.03 <0.03 <0.03 <0.03   BNP: Invalid input(s): POCBNP CBG: Recent Labs  Lab  10/06/17 1725 10/06/17 2033 10/07/17 0130 10/07/17 0730 10/07/17 1129  GLUCAP 165* 170* 226* 267* 284*   D-Dimer Recent Labs    10/05/17 1546  DDIMER 0.42   Hgb A1c Recent Labs    10/05/17 1541  HGBA1C 8.3*   Lipid Profile No results for input(s): CHOL, HDL, LDLCALC, TRIG, CHOLHDL, LDLDIRECT in the last 72 hours. Thyroid function studies No results for input(s): TSH, T4TOTAL, T3FREE, THYROIDAB in the last 72 hours.  Invalid input(s): FREET3 Anemia work up No results for input(s): VITAMINB12, FOLATE, FERRITIN, TIBC, IRON, RETICCTPCT in the last 72 hours. Urinalysis    Component Value Date/Time   COLORURINE YELLOW 12/04/2014 1551   APPEARANCEUR CLEAR 12/04/2014 1551   LABSPEC <1.005 (L) 12/04/2014 1551   PHURINE 6.0 12/04/2014 1551   GLUCOSEU >1000 (A) 12/04/2014 1551   HGBUR NEGATIVE 12/04/2014 1551   BILIRUBINUR NEGATIVE 12/04/2014 1551   KETONESUR 40 (A) 12/04/2014 1551   PROTEINUR NEGATIVE 12/04/2014 1551   UROBILINOGEN 0.2 12/04/2014 1551   NITRITE NEGATIVE 12/04/2014 1551   LEUKOCYTESUR NEGATIVE 12/04/2014 1551   Sepsis Labs Invalid input(s): PROCALCITONIN,  WBC,  LACTICIDVEN Microbiology No results found for this or any previous visit (from the past 240 hour(s)).   Time coordinating discharge: 40mins  SIGNED:   Kathie Dike, MD  [facility] 10/07/2017, 2:10 PM Pager   If 7PM-7AM, please contact night-coverage [email or web address] Password [email or web address]

## 2017-10-07 NOTE — Progress Notes (Signed)
Patient Telemetry removed, IV removed tolerated well.

## 2017-10-10 DIAGNOSIS — R45851 Suicidal ideations: Secondary | ICD-10-CM | POA: Diagnosis not present

## 2017-10-10 DIAGNOSIS — Z8249 Family history of ischemic heart disease and other diseases of the circulatory system: Secondary | ICD-10-CM | POA: Diagnosis not present

## 2017-10-10 DIAGNOSIS — K219 Gastro-esophageal reflux disease without esophagitis: Secondary | ICD-10-CM | POA: Diagnosis not present

## 2017-10-10 DIAGNOSIS — R0602 Shortness of breath: Secondary | ICD-10-CM | POA: Diagnosis not present

## 2017-10-10 DIAGNOSIS — S8002XA Contusion of left knee, initial encounter: Secondary | ICD-10-CM | POA: Diagnosis not present

## 2017-10-10 DIAGNOSIS — F319 Bipolar disorder, unspecified: Secondary | ICD-10-CM | POA: Diagnosis not present

## 2017-10-10 DIAGNOSIS — W1839XA Other fall on same level, initial encounter: Secondary | ICD-10-CM | POA: Diagnosis not present

## 2017-10-10 DIAGNOSIS — Z87891 Personal history of nicotine dependence: Secondary | ICD-10-CM | POA: Diagnosis not present

## 2017-10-10 DIAGNOSIS — I10 Essential (primary) hypertension: Secondary | ICD-10-CM | POA: Diagnosis not present

## 2017-10-10 DIAGNOSIS — F332 Major depressive disorder, recurrent severe without psychotic features: Secondary | ICD-10-CM | POA: Diagnosis not present

## 2017-10-10 DIAGNOSIS — F329 Major depressive disorder, single episode, unspecified: Secondary | ICD-10-CM | POA: Diagnosis not present

## 2017-10-10 DIAGNOSIS — R600 Localized edema: Secondary | ICD-10-CM | POA: Diagnosis not present

## 2017-10-10 DIAGNOSIS — Z833 Family history of diabetes mellitus: Secondary | ICD-10-CM | POA: Diagnosis not present

## 2017-10-10 DIAGNOSIS — E119 Type 2 diabetes mellitus without complications: Secondary | ICD-10-CM | POA: Diagnosis not present

## 2017-10-10 DIAGNOSIS — F322 Major depressive disorder, single episode, severe without psychotic features: Secondary | ICD-10-CM | POA: Diagnosis not present

## 2017-10-10 DIAGNOSIS — R55 Syncope and collapse: Secondary | ICD-10-CM | POA: Diagnosis not present

## 2017-10-10 DIAGNOSIS — Z794 Long term (current) use of insulin: Secondary | ICD-10-CM | POA: Diagnosis not present

## 2017-10-10 DIAGNOSIS — M25462 Effusion, left knee: Secondary | ICD-10-CM | POA: Diagnosis not present

## 2017-10-10 DIAGNOSIS — Z7984 Long term (current) use of oral hypoglycemic drugs: Secondary | ICD-10-CM | POA: Diagnosis not present

## 2017-10-10 DIAGNOSIS — E1142 Type 2 diabetes mellitus with diabetic polyneuropathy: Secondary | ICD-10-CM | POA: Diagnosis not present

## 2017-10-10 DIAGNOSIS — Z79899 Other long term (current) drug therapy: Secondary | ICD-10-CM | POA: Diagnosis not present

## 2017-10-11 DIAGNOSIS — Z590 Homelessness: Secondary | ICD-10-CM | POA: Diagnosis not present

## 2017-10-11 DIAGNOSIS — E1140 Type 2 diabetes mellitus with diabetic neuropathy, unspecified: Secondary | ICD-10-CM | POA: Diagnosis present

## 2017-10-11 DIAGNOSIS — K219 Gastro-esophageal reflux disease without esophagitis: Secondary | ICD-10-CM | POA: Diagnosis present

## 2017-10-11 DIAGNOSIS — E119 Type 2 diabetes mellitus without complications: Secondary | ICD-10-CM | POA: Diagnosis not present

## 2017-10-11 DIAGNOSIS — F250 Schizoaffective disorder, bipolar type: Secondary | ICD-10-CM | POA: Diagnosis not present

## 2017-10-11 DIAGNOSIS — Z794 Long term (current) use of insulin: Secondary | ICD-10-CM | POA: Diagnosis not present

## 2017-10-11 DIAGNOSIS — S8002XA Contusion of left knee, initial encounter: Secondary | ICD-10-CM | POA: Diagnosis not present

## 2017-10-11 DIAGNOSIS — M79605 Pain in left leg: Secondary | ICD-10-CM | POA: Diagnosis present

## 2017-10-11 DIAGNOSIS — Z62810 Personal history of physical and sexual abuse in childhood: Secondary | ICD-10-CM | POA: Diagnosis present

## 2017-10-11 DIAGNOSIS — R45851 Suicidal ideations: Secondary | ICD-10-CM | POA: Diagnosis present

## 2017-10-11 DIAGNOSIS — R55 Syncope and collapse: Secondary | ICD-10-CM | POA: Diagnosis not present

## 2017-10-11 DIAGNOSIS — I10 Essential (primary) hypertension: Secondary | ICD-10-CM | POA: Diagnosis present

## 2017-10-11 DIAGNOSIS — E1142 Type 2 diabetes mellitus with diabetic polyneuropathy: Secondary | ICD-10-CM | POA: Diagnosis not present

## 2017-10-11 DIAGNOSIS — F319 Bipolar disorder, unspecified: Secondary | ICD-10-CM | POA: Diagnosis not present

## 2017-10-11 DIAGNOSIS — K8020 Calculus of gallbladder without cholecystitis without obstruction: Secondary | ICD-10-CM | POA: Diagnosis present

## 2017-10-11 DIAGNOSIS — Z9114 Patient's other noncompliance with medication regimen: Secondary | ICD-10-CM | POA: Diagnosis not present

## 2017-10-11 DIAGNOSIS — F331 Major depressive disorder, recurrent, moderate: Secondary | ICD-10-CM | POA: Diagnosis not present

## 2017-10-11 DIAGNOSIS — R109 Unspecified abdominal pain: Secondary | ICD-10-CM | POA: Diagnosis not present

## 2017-10-11 DIAGNOSIS — F333 Major depressive disorder, recurrent, severe with psychotic symptoms: Secondary | ICD-10-CM | POA: Diagnosis present

## 2017-10-11 DIAGNOSIS — Z915 Personal history of self-harm: Secondary | ICD-10-CM | POA: Diagnosis not present

## 2017-11-05 DIAGNOSIS — F419 Anxiety disorder, unspecified: Secondary | ICD-10-CM | POA: Diagnosis not present

## 2017-11-05 DIAGNOSIS — K219 Gastro-esophageal reflux disease without esophagitis: Secondary | ICD-10-CM | POA: Diagnosis not present

## 2017-11-05 DIAGNOSIS — F314 Bipolar disorder, current episode depressed, severe, without psychotic features: Secondary | ICD-10-CM | POA: Diagnosis not present

## 2017-11-05 DIAGNOSIS — R0902 Hypoxemia: Secondary | ICD-10-CM | POA: Diagnosis not present

## 2017-11-05 DIAGNOSIS — K5730 Diverticulosis of large intestine without perforation or abscess without bleeding: Secondary | ICD-10-CM | POA: Diagnosis not present

## 2017-11-05 DIAGNOSIS — R5383 Other fatigue: Secondary | ICD-10-CM | POA: Diagnosis not present

## 2017-11-05 DIAGNOSIS — F29 Unspecified psychosis not due to a substance or known physiological condition: Secondary | ICD-10-CM | POA: Diagnosis not present

## 2017-11-05 DIAGNOSIS — R45851 Suicidal ideations: Secondary | ICD-10-CM | POA: Diagnosis not present

## 2017-11-05 DIAGNOSIS — I10 Essential (primary) hypertension: Secondary | ICD-10-CM | POA: Diagnosis not present

## 2017-11-05 DIAGNOSIS — E1165 Type 2 diabetes mellitus with hyperglycemia: Secondary | ICD-10-CM | POA: Diagnosis not present

## 2017-11-05 DIAGNOSIS — E782 Mixed hyperlipidemia: Secondary | ICD-10-CM | POA: Diagnosis not present

## 2017-11-05 DIAGNOSIS — E1140 Type 2 diabetes mellitus with diabetic neuropathy, unspecified: Secondary | ICD-10-CM | POA: Diagnosis not present

## 2017-11-05 DIAGNOSIS — N281 Cyst of kidney, acquired: Secondary | ICD-10-CM | POA: Diagnosis not present

## 2017-11-05 DIAGNOSIS — R079 Chest pain, unspecified: Secondary | ICD-10-CM | POA: Diagnosis not present

## 2017-11-05 DIAGNOSIS — K5900 Constipation, unspecified: Secondary | ICD-10-CM | POA: Diagnosis not present

## 2017-11-05 DIAGNOSIS — R630 Anorexia: Secondary | ICD-10-CM | POA: Diagnosis not present

## 2017-11-05 DIAGNOSIS — R0602 Shortness of breath: Secondary | ICD-10-CM | POA: Diagnosis not present

## 2017-11-05 DIAGNOSIS — I517 Cardiomegaly: Secondary | ICD-10-CM | POA: Diagnosis not present

## 2017-11-05 DIAGNOSIS — R109 Unspecified abdominal pain: Secondary | ICD-10-CM | POA: Diagnosis not present

## 2017-11-06 DIAGNOSIS — K5790 Diverticulosis of intestine, part unspecified, without perforation or abscess without bleeding: Secondary | ICD-10-CM | POA: Diagnosis present

## 2017-11-06 DIAGNOSIS — I10 Essential (primary) hypertension: Secondary | ICD-10-CM | POA: Diagnosis present

## 2017-11-06 DIAGNOSIS — Z87891 Personal history of nicotine dependence: Secondary | ICD-10-CM | POA: Diagnosis not present

## 2017-11-06 DIAGNOSIS — F3131 Bipolar disorder, current episode depressed, mild: Secondary | ICD-10-CM | POA: Diagnosis not present

## 2017-11-06 DIAGNOSIS — K219 Gastro-esophageal reflux disease without esophagitis: Secondary | ICD-10-CM | POA: Diagnosis present

## 2017-11-06 DIAGNOSIS — Z794 Long term (current) use of insulin: Secondary | ICD-10-CM | POA: Diagnosis not present

## 2017-11-06 DIAGNOSIS — E1165 Type 2 diabetes mellitus with hyperglycemia: Secondary | ICD-10-CM | POA: Diagnosis present

## 2017-11-06 DIAGNOSIS — R45851 Suicidal ideations: Secondary | ICD-10-CM | POA: Diagnosis present

## 2017-11-06 DIAGNOSIS — Z888 Allergy status to other drugs, medicaments and biological substances status: Secondary | ICD-10-CM | POA: Diagnosis not present

## 2017-11-06 DIAGNOSIS — Z79899 Other long term (current) drug therapy: Secondary | ICD-10-CM | POA: Diagnosis not present

## 2017-11-06 DIAGNOSIS — E782 Mixed hyperlipidemia: Secondary | ICD-10-CM | POA: Diagnosis present

## 2017-11-06 DIAGNOSIS — E1140 Type 2 diabetes mellitus with diabetic neuropathy, unspecified: Secondary | ICD-10-CM | POA: Diagnosis present

## 2017-11-06 DIAGNOSIS — F314 Bipolar disorder, current episode depressed, severe, without psychotic features: Secondary | ICD-10-CM | POA: Diagnosis present

## 2017-11-06 DIAGNOSIS — E1340 Other specified diabetes mellitus with diabetic neuropathy, unspecified: Secondary | ICD-10-CM | POA: Diagnosis not present

## 2017-11-06 DIAGNOSIS — R112 Nausea with vomiting, unspecified: Secondary | ICD-10-CM | POA: Diagnosis present

## 2017-11-06 DIAGNOSIS — R197 Diarrhea, unspecified: Secondary | ICD-10-CM | POA: Diagnosis present

## 2017-11-06 DIAGNOSIS — Z880 Allergy status to penicillin: Secondary | ICD-10-CM | POA: Diagnosis not present

## 2017-11-10 NOTE — Progress Notes (Deleted)
Cardiology Office Note    Date:  11/10/2017   ID:  [name], DOB [birth year], MRN [id number]  PCP:  Patient, No Pcp Per  Cardiologist: Kate Sable, MD    No chief complaint on file.   History of Present Illness:    [name] is a 57 y.o. male with past medical history of HTN, HLD, Type II DM, prior CVA's, and Bipolar disorder who presents to the office today for hospital follow-up.  He was recently admitted to [facility] in 09/2017 for evaluation of progressive chest discomfort over the past month. His EKG showed no acute ischemic changes and troponin values remain negative. He reported having 2 strokes within the past 6 months, therefore a cardiac catheterization was avoided. A Lexiscan Myoview was performed and showed possible myocardial scar versus soft tissue attenuation but no large ischemic territories and this was interpreted as an intermediate risk study due to his reduced EF of 37%. An echocardiogram was obtained and showed his EF was 45 to 50% with diffuse hypokinesis and grade 1 diastolic dysfunction noted. Medical management was recommended at that time and he was started on Imdur 30 mg daily along with ASA 325mg  daily (due to recent CVA's), Atorvastatin 40mg  daily, and Lopressor 100mg  BID.     Past Medical History:  Diagnosis Date  . Anxiety   . Bipolar disorder ([place])   . Chronic pain   . Depression   . Diabetes mellitus without complication ([place])   . Diverticulitis   . GERD (gastroesophageal reflux disease)   . Hypertension   . IBS (irritable bowel syndrome)   . Kidney stone   . Migraine headache   . Neuropathy     Past Surgical History:  Procedure Laterality Date  . ANKLE SURGERY Right   . CARPAL TUNNEL RELEASE    . CHOLECYSTECTOMY    . COLONOSCOPY      Current Medications: Outpatient Medications Prior to Visit  Medication Sig Dispense Refill  . aspirin EC 325 MG EC tablet Take 1 tablet (325 mg total) by mouth daily. 30 tablet 0  .  atorvastatin (LIPITOR) 40 MG tablet Take 1 tablet (40 mg total) by mouth daily at 6 PM. 30 tablet 0  . DULoxetine (CYMBALTA) 30 MG capsule Take by mouth.    . escitalopram (LEXAPRO) 20 MG tablet Take 20 mg by mouth daily.    . insulin aspart (NOVOLOG) 100 UNIT/ML injection Inject into the skin.    Marland Kitchen insulin aspart protamine- aspart (NOVOLOG MIX 70/30) (70-30) 100 UNIT/ML injection Inject 50 Units into the skin 2 (two) times daily.    . isosorbide mononitrate (IMDUR) 30 MG 24 hr tablet Take 1 tablet (30 mg total) by mouth daily. 30 tablet 0  . lisinopril (PRINIVIL,ZESTRIL) 20 MG tablet Take 20 mg by mouth daily.    Marland Kitchen lurasidone (LATUDA) 40 MG TABS tablet Take 40 mg by mouth daily with breakfast.    . metFORMIN (GLUCOPHAGE) 1000 MG tablet Take 1,000 mg by mouth 3 (three) times daily.    . metoprolol (LOPRESSOR) 100 MG tablet Take 100 mg by mouth 2 (two) times daily.    . mirtazapine (REMERON) 15 MG tablet Take by mouth.    . nortriptyline (PAMELOR) 75 MG capsule Take 75 mg by mouth 3 (three) times daily.    Marland Kitchen omeprazole (PRILOSEC) 20 MG capsule Take 20 mg by mouth daily.    . pregabalin (LYRICA) 200 MG capsule Take 200 mg by mouth 3 (three) times daily.    Marland Kitchen  vitamin B-12 (CYANOCOBALAMIN) 100 MCG tablet Take 100 mcg by mouth daily.    . Vitamin D, Ergocalciferol, (DRISDOL) 50000 UNITS CAPS capsule Take 50,000 Units by mouth every 7 (seven) days. Usually takes on Wednesday     No facility-administered medications prior to visit.      Allergies:   Meloxicam; Venlafaxine; and Penicillins   Social History   Socioeconomic History  . Marital status: Single    Spouse name: Not on file  . Number of children: Not on file  . Years of education: Not on file  . Highest education level: Not on file  Occupational History  . Not on file  Social Needs  . Financial resource strain: Not on file  . Food insecurity:    Worry: Not on file    Inability: Not on file  . Transportation needs:    Medical:  Not on file    Non-medical: Not on file  Tobacco Use  . Smoking status: Never Smoker  Substance and Sexual Activity  . Alcohol use: No  . Drug use: Not Currently  . Sexual activity: Not on file  Lifestyle  . Physical activity:    Days per week: Not on file    Minutes per session: Not on file  . Stress: Not on file  Relationships  . Social connections:    Talks on phone: Not on file    Gets together: Not on file    Attends religious service: Not on file    Active member of club or organization: Not on file    Attends meetings of clubs or organizations: Not on file    Relationship status: Not on file  Other Topics Concern  . Not on file  Social History Narrative  . Not on file     Family History:  The patient's ***family history includes CAD in his brother and mother.   Review of Systems:   Please see the history of present illness.     General:  No chills, fever, night sweats or weight changes.  Cardiovascular:  No chest pain, dyspnea on exertion, edema, orthopnea, palpitations, paroxysmal nocturnal dyspnea. Dermatological: No rash, lesions/masses Respiratory: No cough, dyspnea Urologic: No hematuria, dysuria Abdominal:   No nausea, vomiting, diarrhea, bright red blood per rectum, melena, or hematemesis Neurologic:  No visual changes, wkns, changes in mental status. All other systems reviewed and are otherwise negative except as noted above.   Physical Exam:    VS:  There were no vitals taken for this visit.   General: Well developed, well nourished,male appearing in no acute distress. Head: Normocephalic, atraumatic, sclera non-icteric, no xanthomas, nares are without discharge.  Neck: No carotid bruits. JVD not elevated.  Lungs: Respirations regular and unlabored, without wheezes or rales.  Heart: ***Regular rate and rhythm. No S3 or S4.  No murmur, no rubs, or gallops appreciated. Abdomen: Soft, non-tender, non-distended with normoactive bowel sounds. No  hepatomegaly. No rebound/guarding. No obvious abdominal masses. Msk:  Strength and tone appear normal for age. No joint deformities or effusions. Extremities: No clubbing or cyanosis. No edema.  Distal pedal pulses are 2+ bilaterally. Neuro: Alert and oriented X 3. Moves all extremities spontaneously. No focal deficits noted. Psych:  Responds to questions appropriately with a normal affect. Skin: No rashes or lesions noted  Wt Readings from Last 3 Encounters:  10/05/17 195 lb (88.5 kg)  12/05/14 190 lb (86.2 kg)  10/31/14 182 lb (82.6 kg)        Studies/Labs Reviewed:  EKG:  EKG is*** ordered today.  The ekg ordered today demonstrates ***  Recent Labs: 10/05/2017: BUN 21; Creatinine, Ser 1.17; Hemoglobin 15.8; Platelets 266; Potassium 3.7; Sodium 140   Lipid Panel No results found for: CHOL, TRIG, HDL, CHOLHDL, VLDL, LDLCALC, LDLDIRECT  Additional studies/ records that were reviewed today include:   NST: 10/06/2017  There was no ST segment deviation noted during stress.  Defect 1: There is a medium defect of mild severity present in the mid anterior, mid inferior, apical anterior and apical inferior location. This may be related to myocardial scar, although soft tissue attenuation may also be contributing to the defects. No large ischemic territories.  This is an intermediate risk study primarly based upon LVEF reduction.  Nuclear stress EF: 37%.  Echocardiogram: 10/06/2017 Study Conclusions  - Left ventricle: The cavity size was normal. Wall thickness was   increased in a pattern of mild LVH. Systolic function was low   normal to mildly reduced. The estimated ejection fraction was in   the range of 45% to 50%. Diffuse hypokinesis. Doppler parameters   are consistent with abnormal left ventricular relaxation (grade 1   diastolic dysfunction). Doppler parameters are consistent with   high ventricular filling pressure. - Mitral valve: There was mild regurgitation. -  Right ventricle: Systolic function was reduced. - Atrial septum: No defect or patent foramen ovale was identified.  Assessment:    No diagnosis found.   Plan:   In order of problems listed above:  1. Chest Pain  2. HTN  3. HLD  4. Recent Multiple CVA's    Medication Adjustments/Labs and Tests Ordered: Current medicines are reviewed at length with the patient today.  Concerns regarding medicines are outlined above.  Medication changes, Labs and Tests ordered today are listed in the Patient Instructions below. There are no Patient Instructions on file for this visit.   Signed, Erma Heritage, PA-C  11/10/2017 6:00 PM    [place] [address] [place],  [address] Phone: [phone]

## 2017-11-11 ENCOUNTER — Ambulatory Visit: Payer: Medicare Other | Admitting: Student

## 2017-11-12 ENCOUNTER — Encounter: Payer: Self-pay | Admitting: Student

## 2018-01-12 DIAGNOSIS — K861 Other chronic pancreatitis: Secondary | ICD-10-CM | POA: Diagnosis not present

## 2018-01-12 DIAGNOSIS — R1013 Epigastric pain: Secondary | ICD-10-CM | POA: Diagnosis not present

## 2018-01-12 DIAGNOSIS — F4310 Post-traumatic stress disorder, unspecified: Secondary | ICD-10-CM | POA: Diagnosis not present

## 2018-01-12 DIAGNOSIS — E1140 Type 2 diabetes mellitus with diabetic neuropathy, unspecified: Secondary | ICD-10-CM | POA: Diagnosis not present

## 2018-01-12 DIAGNOSIS — F315 Bipolar disorder, current episode depressed, severe, with psychotic features: Secondary | ICD-10-CM | POA: Diagnosis not present

## 2018-01-12 DIAGNOSIS — R112 Nausea with vomiting, unspecified: Secondary | ICD-10-CM | POA: Diagnosis not present

## 2018-01-12 DIAGNOSIS — R45851 Suicidal ideations: Secondary | ICD-10-CM | POA: Diagnosis not present

## 2018-01-12 DIAGNOSIS — E1165 Type 2 diabetes mellitus with hyperglycemia: Secondary | ICD-10-CM | POA: Diagnosis not present

## 2018-01-12 DIAGNOSIS — E119 Type 2 diabetes mellitus without complications: Secondary | ICD-10-CM | POA: Diagnosis not present

## 2018-01-12 DIAGNOSIS — Z8719 Personal history of other diseases of the digestive system: Secondary | ICD-10-CM | POA: Diagnosis not present

## 2018-01-12 DIAGNOSIS — Z880 Allergy status to penicillin: Secondary | ICD-10-CM | POA: Diagnosis not present

## 2018-01-12 DIAGNOSIS — F39 Unspecified mood [affective] disorder: Secondary | ICD-10-CM | POA: Diagnosis not present

## 2018-01-12 DIAGNOSIS — F329 Major depressive disorder, single episode, unspecified: Secondary | ICD-10-CM | POA: Diagnosis not present

## 2018-01-12 DIAGNOSIS — I10 Essential (primary) hypertension: Secondary | ICD-10-CM | POA: Diagnosis not present

## 2018-01-12 DIAGNOSIS — Z794 Long term (current) use of insulin: Secondary | ICD-10-CM | POA: Diagnosis not present

## 2018-01-12 DIAGNOSIS — D3502 Benign neoplasm of left adrenal gland: Secondary | ICD-10-CM | POA: Diagnosis not present

## 2018-01-12 DIAGNOSIS — N281 Cyst of kidney, acquired: Secondary | ICD-10-CM | POA: Diagnosis not present

## 2018-01-13 DIAGNOSIS — E1165 Type 2 diabetes mellitus with hyperglycemia: Secondary | ICD-10-CM | POA: Diagnosis present

## 2018-01-13 DIAGNOSIS — E1140 Type 2 diabetes mellitus with diabetic neuropathy, unspecified: Secondary | ICD-10-CM | POA: Diagnosis present

## 2018-01-13 DIAGNOSIS — E782 Mixed hyperlipidemia: Secondary | ICD-10-CM | POA: Diagnosis present

## 2018-01-13 DIAGNOSIS — E1340 Other specified diabetes mellitus with diabetic neuropathy, unspecified: Secondary | ICD-10-CM | POA: Diagnosis not present

## 2018-01-13 DIAGNOSIS — Z79899 Other long term (current) drug therapy: Secondary | ICD-10-CM | POA: Diagnosis not present

## 2018-01-13 DIAGNOSIS — Z590 Homelessness: Secondary | ICD-10-CM | POA: Diagnosis not present

## 2018-01-13 DIAGNOSIS — F315 Bipolar disorder, current episode depressed, severe, with psychotic features: Secondary | ICD-10-CM | POA: Diagnosis present

## 2018-01-13 DIAGNOSIS — Z794 Long term (current) use of insulin: Secondary | ICD-10-CM | POA: Diagnosis not present

## 2018-01-13 DIAGNOSIS — Z87891 Personal history of nicotine dependence: Secondary | ICD-10-CM | POA: Diagnosis not present

## 2018-01-13 DIAGNOSIS — Z880 Allergy status to penicillin: Secondary | ICD-10-CM | POA: Diagnosis not present

## 2018-01-13 DIAGNOSIS — K219 Gastro-esophageal reflux disease without esophagitis: Secondary | ICD-10-CM | POA: Diagnosis present

## 2018-01-13 DIAGNOSIS — K861 Other chronic pancreatitis: Secondary | ICD-10-CM | POA: Diagnosis present

## 2018-01-13 DIAGNOSIS — Z888 Allergy status to other drugs, medicaments and biological substances status: Secondary | ICD-10-CM | POA: Diagnosis not present

## 2018-01-13 DIAGNOSIS — I10 Essential (primary) hypertension: Secondary | ICD-10-CM | POA: Diagnosis present

## 2018-01-13 DIAGNOSIS — R45851 Suicidal ideations: Secondary | ICD-10-CM | POA: Diagnosis present

## 2018-01-13 DIAGNOSIS — F4310 Post-traumatic stress disorder, unspecified: Secondary | ICD-10-CM | POA: Diagnosis present

## 2018-03-12 ENCOUNTER — Other Ambulatory Visit: Payer: Self-pay

## 2018-03-12 ENCOUNTER — Encounter (HOSPITAL_COMMUNITY): Payer: Self-pay | Admitting: *Deleted

## 2018-03-12 ENCOUNTER — Observation Stay (HOSPITAL_COMMUNITY)
Admission: EM | Admit: 2018-03-12 | Discharge: 2018-03-15 | Disposition: A | Discharge status: Home / Self Care | Payer: Medicare Other | Attending: Internal Medicine | Admitting: Internal Medicine

## 2018-03-12 ENCOUNTER — Emergency Department (HOSPITAL_COMMUNITY): Payer: Medicare Other

## 2018-03-12 DIAGNOSIS — K8681 Exocrine pancreatic insufficiency: Secondary | ICD-10-CM | POA: Insufficient documentation

## 2018-03-12 DIAGNOSIS — E11621 Type 2 diabetes mellitus with foot ulcer: Secondary | ICD-10-CM

## 2018-03-12 DIAGNOSIS — R600 Localized edema: Secondary | ICD-10-CM | POA: Diagnosis present

## 2018-03-12 DIAGNOSIS — L97429 Non-pressure chronic ulcer of left heel and midfoot with unspecified severity: Principal | ICD-10-CM | POA: Insufficient documentation

## 2018-03-12 DIAGNOSIS — F319 Bipolar disorder, unspecified: Secondary | ICD-10-CM | POA: Diagnosis present

## 2018-03-12 DIAGNOSIS — L97422 Non-pressure chronic ulcer of left heel and midfoot with fat layer exposed: Secondary | ICD-10-CM | POA: Diagnosis not present

## 2018-03-12 DIAGNOSIS — Z794 Long term (current) use of insulin: Secondary | ICD-10-CM | POA: Diagnosis not present

## 2018-03-12 DIAGNOSIS — Z79899 Other long term (current) drug therapy: Secondary | ICD-10-CM | POA: Diagnosis not present

## 2018-03-12 DIAGNOSIS — L97401 Non-pressure chronic ulcer of unspecified heel and midfoot limited to breakdown of skin: Secondary | ICD-10-CM

## 2018-03-12 DIAGNOSIS — L03119 Cellulitis of unspecified part of limb: Secondary | ICD-10-CM

## 2018-03-12 DIAGNOSIS — E08621 Diabetes mellitus due to underlying condition with foot ulcer: Secondary | ICD-10-CM | POA: Diagnosis not present

## 2018-03-12 DIAGNOSIS — Z7982 Long term (current) use of aspirin: Secondary | ICD-10-CM | POA: Insufficient documentation

## 2018-03-12 DIAGNOSIS — E1340 Other specified diabetes mellitus with diabetic neuropathy, unspecified: Secondary | ICD-10-CM

## 2018-03-12 DIAGNOSIS — R7401 Elevation of levels of liver transaminase levels: Secondary | ICD-10-CM

## 2018-03-12 DIAGNOSIS — E785 Hyperlipidemia, unspecified: Secondary | ICD-10-CM | POA: Diagnosis not present

## 2018-03-12 DIAGNOSIS — E11628 Type 2 diabetes mellitus with other skin complications: Secondary | ICD-10-CM

## 2018-03-12 DIAGNOSIS — I10 Essential (primary) hypertension: Secondary | ICD-10-CM | POA: Diagnosis present

## 2018-03-12 DIAGNOSIS — L97409 Non-pressure chronic ulcer of unspecified heel and midfoot with unspecified severity: Secondary | ICD-10-CM | POA: Diagnosis present

## 2018-03-12 DIAGNOSIS — Z23 Encounter for immunization: Secondary | ICD-10-CM | POA: Insufficient documentation

## 2018-03-12 DIAGNOSIS — E1165 Type 2 diabetes mellitus with hyperglycemia: Secondary | ICD-10-CM

## 2018-03-12 DIAGNOSIS — L03116 Cellulitis of left lower limb: Secondary | ICD-10-CM | POA: Diagnosis not present

## 2018-03-12 DIAGNOSIS — E1140 Type 2 diabetes mellitus with diabetic neuropathy, unspecified: Secondary | ICD-10-CM | POA: Diagnosis not present

## 2018-03-12 DIAGNOSIS — L97509 Non-pressure chronic ulcer of other part of unspecified foot with unspecified severity: Secondary | ICD-10-CM

## 2018-03-12 DIAGNOSIS — R740 Nonspecific elevation of levels of transaminase and lactic acid dehydrogenase [LDH]: Secondary | ICD-10-CM

## 2018-03-12 DIAGNOSIS — L97421 Non-pressure chronic ulcer of left heel and midfoot limited to breakdown of skin: Secondary | ICD-10-CM | POA: Diagnosis not present

## 2018-03-12 LAB — GLUCOSE, CAPILLARY: Glucose-Capillary: 250 mg/dL — ABNORMAL HIGH (ref 70–99)

## 2018-03-12 LAB — BASIC METABOLIC PANEL
ANION GAP: 6 (ref 5–15)
BUN: 18 mg/dL (ref 6–20)
CALCIUM: 9.1 mg/dL (ref 8.9–10.3)
CO2: 26 mmol/L (ref 22–32)
Chloride: 101 mmol/L (ref 98–111)
Creatinine, Ser: 0.99 mg/dL (ref 0.61–1.24)
Glucose, Bld: 279 mg/dL — ABNORMAL HIGH (ref 70–99)
Potassium: 4.1 mmol/L (ref 3.5–5.1)
Sodium: 133 mmol/L — ABNORMAL LOW (ref 135–145)

## 2018-03-12 LAB — CBC WITH DIFFERENTIAL/PLATELET
Abs Immature Granulocytes: 0.01 10*3/uL (ref 0.00–0.07)
Basophils Absolute: 0 10*3/uL (ref 0.0–0.1)
Basophils Relative: 0 %
EOS ABS: 0.4 10*3/uL (ref 0.0–0.5)
Eosinophils Relative: 5 %
HEMATOCRIT: 45.9 % (ref 39.0–52.0)
Hemoglobin: 14.3 g/dL (ref 13.0–17.0)
Immature Granulocytes: 0 %
LYMPHS ABS: 2.6 10*3/uL (ref 0.7–4.0)
Lymphocytes Relative: 32 %
MCH: 26.4 pg (ref 26.0–34.0)
MCHC: 31.2 g/dL (ref 30.0–36.0)
MCV: 84.7 fL (ref 80.0–100.0)
Monocytes Absolute: 0.7 10*3/uL (ref 0.1–1.0)
Monocytes Relative: 8 %
Neutro Abs: 4.4 10*3/uL (ref 1.7–7.7)
Neutrophils Relative %: 55 %
PLATELETS: 313 10*3/uL (ref 150–400)
RBC: 5.42 MIL/uL (ref 4.22–5.81)
RDW: 12.8 % (ref 11.5–15.5)
WBC: 8 10*3/uL (ref 4.0–10.5)
nRBC: 0 % (ref 0.0–0.2)

## 2018-03-12 LAB — SEDIMENTATION RATE: Sed Rate: 30 mm/hr — ABNORMAL HIGH (ref 0–16)

## 2018-03-12 LAB — CBG MONITORING, ED: GLUCOSE-CAPILLARY: 272 mg/dL — AB (ref 70–99)

## 2018-03-12 MED ORDER — ONDANSETRON HCL 4 MG/2ML IJ SOLN: 4.0000 mg | Freq: Four times a day (QID) | INTRAMUSCULAR | Status: DC | PRN

## 2018-03-12 MED ORDER — HYDRALAZINE HCL 20 MG/ML IJ SOLN: 5.0000 mg | Freq: Four times a day (QID) | INTRAMUSCULAR | Status: DC | PRN

## 2018-03-12 MED ORDER — INSULIN ASPART 100 UNIT/ML ~~LOC~~ SOLN
0.0000 [IU] | Freq: Every day | SUBCUTANEOUS | Status: DC
  Administered 2018-03-12: 3 [IU] via SUBCUTANEOUS

## 2018-03-12 MED ORDER — TETANUS-DIPHTH-ACELL PERTUSSIS 5-2.5-18.5 LF-MCG/0.5 IM SUSP
0.5000 mL | Freq: Once | INTRAMUSCULAR | Status: AC
  Administered 2018-03-12: 0.5 mL via INTRAMUSCULAR
  Filled 2018-03-12: qty 0.5

## 2018-03-12 MED ORDER — CIPROFLOXACIN IN D5W 400 MG/200ML IV SOLN
400.0000 mg | Freq: Once | INTRAVENOUS | Status: AC
  Administered 2018-03-12: 400 mg via INTRAVENOUS
  Filled 2018-03-12: qty 200

## 2018-03-12 MED ORDER — MORPHINE SULFATE (PF) 4 MG/ML IV SOLN
4.0000 mg | Freq: Once | INTRAVENOUS | Status: AC
  Administered 2018-03-12: 4 mg via INTRAVENOUS
  Filled 2018-03-12: qty 1

## 2018-03-12 MED ORDER — LEVOFLOXACIN IN D5W 500 MG/100ML IV SOLN
500.0000 mg | INTRAVENOUS | Status: DC
  Filled 2018-03-12: qty 100

## 2018-03-12 MED ORDER — HYDROMORPHONE HCL 1 MG/ML IJ SOLN
0.5000 mg | INTRAMUSCULAR | Status: DC | PRN
  Administered 2018-03-12 – 2018-03-15 (×11): 0.5 mg via INTRAVENOUS
  Filled 2018-03-12 (×11): qty 0.5

## 2018-03-12 MED ORDER — VANCOMYCIN HCL IN DEXTROSE 1-5 GM/200ML-% IV SOLN
1000.0000 mg | Freq: Two times a day (BID) | INTRAVENOUS | Status: DC
  Administered 2018-03-12 – 2018-03-14 (×5): 1000 mg via INTRAVENOUS
  Filled 2018-03-12 (×5): qty 200

## 2018-03-12 MED ORDER — ACETAMINOPHEN 650 MG RE SUPP: 650.0000 mg | Freq: Four times a day (QID) | RECTAL | Status: DC | PRN

## 2018-03-12 MED ORDER — TRAMADOL HCL 50 MG PO TABS
50.0000 mg | ORAL_TABLET | Freq: Four times a day (QID) | ORAL | Status: DC | PRN
  Administered 2018-03-15 (×2): 50 mg via ORAL
  Filled 2018-03-12 (×2): qty 1

## 2018-03-12 MED ORDER — ACETAMINOPHEN 325 MG PO TABS: 650.0000 mg | ORAL_TABLET | Freq: Four times a day (QID) | ORAL | Status: DC | PRN

## 2018-03-12 MED ORDER — ENOXAPARIN SODIUM 40 MG/0.4ML ~~LOC~~ SOLN
40.0000 mg | SUBCUTANEOUS | Status: DC
  Administered 2018-03-12 – 2018-03-14 (×3): 40 mg via SUBCUTANEOUS
  Filled 2018-03-12 (×3): qty 0.4

## 2018-03-12 MED ORDER — INSULIN ASPART 100 UNIT/ML ~~LOC~~ SOLN
0.0000 [IU] | Freq: Three times a day (TID) | SUBCUTANEOUS | Status: DC
  Administered 2018-03-13: 3 [IU] via SUBCUTANEOUS

## 2018-03-12 MED ORDER — SODIUM CHLORIDE 0.9 % IV SOLN
INTRAVENOUS | Status: AC
  Administered 2018-03-12: 23:00:00 via INTRAVENOUS

## 2018-03-12 NOTE — Progress Notes (Signed)
Pharmacy Antibiotic Note  [name] is a 57 y.o. male admitted on 03/12/2018 with cellulitis.  Pharmacy has been consulted for Vancomycin dosing.  Plan: Vancomycin 1gm IV every 12 hours.  Goal trough 10-15 mcg/mL.  Monitor labs, micro and vitals.    Height: 5' 10.5" (179.1 cm) Weight: 198 lb (89.8 kg) IBW/kg (Calculated) : 74.15  Temp (24hrs), Avg:97.7 F (36.5 C), Min:97.5 F (36.4 C), Max:97.8 F (36.6 C)  Recent Labs  Lab 03/12/18 2045  WBC 8.0  CREATININE 0.99    Estimated Creatinine Clearance: 91.4 mL/min (by C-G formula based on SCr of 0.99 mg/dL).    Allergies  Allergen Reactions  . Meloxicam Other (See Comments)    Acid Reflux   . Venlafaxine Other (See Comments)    Acid Reflux  . Penicillins Rash    Antimicrobials this admission: Vanc 10/24 >>  Cipro 10/24 >>   Dose adjustments this admission: n/a   Microbiology results:  BCx:   UCx:   Sputum:    MRSA PCR:   Thank you for allowing pharmacy to be a part of this patient's care.  Pricilla Larsson 03/12/2018 10:20 PM

## 2018-03-12 NOTE — ED Triage Notes (Signed)
Foul smelling wound to left foot

## 2018-03-12 NOTE — H&P (Signed)
TRH H&P   Patient Demographics:    [name], is a 57 y.o. male  MRN: [id number]   DOB - 02/06/1961  Admit Date - 03/12/2018  Outpatient Primary MD for the patient is Patient, No Pcp Per [place] per patient  Referring MD/NP/PA:   Angelina Ok  Outpatient Specialists:     Patient coming from:  Group home  Chief Complaint  Patient presents with  . Diabetic Ulcer      HPI:    [name]  is a 57 y.o. male, w hypertension, dm2, apparently c/o foul smelling left heel ulcer x 2 weeks. Pt is not currently on abx.   Pt denies fever, chills, cp, palp, sob, n/v, diarrhea, brbpr, black stool.    In Ed,   T 97.7, P 67  Bp 150/86  Pox 97% on RA  xay L heel FINDINGS: Posterior and plantar calcaneal spurs. No radiographic changes of osteomyelitis. No fracture, subluxation or dislocation. Mild degenerative changes in the left ankle.  IMPRESSION: No acute bony abnormality.  ESR 30  Wbc 8.0, Hgb 14.3, Plt 313 Na 133, K 4.1, Bun 18, Creatinine 0.99  Pt will be admitted for left heel foul smelling ulcer and cellulitis   Review of systems:    In addition to the HPI above,  No Fever-chills, No Headache, No changes with Vision or hearing, No problems swallowing food or Liquids, No Chest pain, Cough or Shortness of Breath, No Abdominal pain, No Nausea or Vommitting, Bowel movements are regular, No Blood in stool or Urine, No dysuria,   No new joints pains-aches,  No new weakness, tingling, numbness in any extremity, No recent weight gain or loss, No polyuria, polydypsia or polyphagia, No significant Mental Stressors.  A full 10 point Review of Systems was done, except as stated above, all other Review of Systems were negative.   With Past History of the following :    Past Medical History:  Diagnosis Date  . Anxiety   . Bipolar disorder ([place])   . Chronic  pain   . Depression   . Diabetes mellitus without complication ([place])   . Diverticulitis   . GERD (gastroesophageal reflux disease)   . Hypertension   . IBS (irritable bowel syndrome)   . Kidney stone   . Migraine headache   . Neuropathy       Past Surgical History:  Procedure Laterality Date  . ANKLE SURGERY Right   . CARPAL TUNNEL RELEASE    . CHOLECYSTECTOMY    . COLONOSCOPY        Social History:     Social History   Tobacco Use  . Smoking status: Never Smoker  . Smokeless tobacco: Never Used  Substance Use Topics  . Alcohol use: No     Lives - at home  Mobility - walks by self   Family History :  Family History  Problem Relation Age of Onset  . CAD Mother   . CAD Brother        Home Medications:   Prior to Admission medications   Medication Sig Start Date End Date Taking? Authorizing Provider  DULoxetine (CYMBALTA) 60 MG capsule Take 60 mg by mouth daily.  08/04/17  Yes [provider]  haloperidol (HALDOL) 5 MG tablet Take 5 mg by mouth 2 (two) times daily.   Yes [provider]  hydrOXYzine (ATARAX/VISTARIL) 50 MG tablet Take 50 mg by mouth 2 (two) times daily as needed for anxiety.   Yes [provider]  insulin aspart (NOVOLOG) 100 UNIT/ML injection Inject 16 Units into the skin 3 (three) times daily.    Yes [provider]  insulin glargine (LANTUS) 100 UNIT/ML injection Inject 35 Units into the skin daily before breakfast.   Yes [provider]  lipase/protease/amylase (CREON) 36000 UNITS CPEP capsule Take 36,000 Units by mouth 3 (three) times daily with meals.   Yes [provider]  losartan (COZAAR) 100 MG tablet Take 100 mg by mouth daily.   Yes [provider]  lurasidone (LATUDA) 40 MG TABS tablet Take 40 mg by mouth daily with supper.    Yes [provider]  metoprolol tartrate (LOPRESSOR) 50 MG tablet Take 25 mg by mouth 2 (two) times daily.    Yes [provider]  minocycline (MINOCIN,DYNACIN) 100 MG capsule Take 100 mg by mouth 2 (two) times daily. 10 day course   Yes [provider]  ondansetron (ZOFRAN) 8 MG tablet Take 4 mg by mouth 3 (three) times daily.   Yes [provider]  pantoprazole (PROTONIX) 40 MG tablet Take 40 mg by mouth daily before breakfast.   Yes [provider]  pravastatin (PRAVACHOL) 40 MG tablet Take 40 mg by mouth at bedtime.   Yes [provider]  prazosin (MINIPRESS) 2 MG capsule Take 2 mg by mouth at bedtime.   Yes [provider]  pregabalin (LYRICA) 200 MG capsule Take 200 mg by mouth 2 (two) times daily.    Yes [provider]  insulin aspart protamine- aspart (NOVOLOG MIX 70/30) (70-30) 100 UNIT/ML injection Inject 50 Units into the skin 2 (two) times daily.    [provider]     Allergies:     Allergies  Allergen Reactions  . Meloxicam Other (See Comments)    Acid Reflux   . Venlafaxine Other (See Comments)    Acid Reflux  . Penicillins Rash     Physical Exam:   Vitals  Blood pressure (!) 186/80, pulse 61, temperature 97.8 F (36.6 C), temperature source Oral, resp. rate 20, height 5' 10.5" (1.791 m), weight 89.8 kg, SpO2 96 %.   1. General lying in bed in NAD,   2. Normal affect and insight, Not Suicidal or Homicidal, Awake Alert, Oriented X 3.  3. No F.N deficits, ALL C.Nerves Intact, Strength 5/5 all 4 extremities, Sensation intact all 4 extremities, Plantars down going.  4. Ears and Eyes appear Normal, Conjunctivae clear, PERRLA. Moist Oral Mucosa.  5. Supple Neck, No JVD, No cervical lymphadenopathy appriciated, No Carotid Bruits.  6. Symmetrical Chest wall movement, Good air movement bilaterally, CTAB.  7. RRR, No Gallops, Rubs or Murmurs, No Parasternal Heave.  8. Positive Bowel Sounds, Abdomen Soft, No tenderness, No organomegaly appriciated,No rebound -guarding or rigidity.  9.  No Cyanosis, 3cm skin ulcer,  foul smelling on the back of left heel  with surrounding erythema. 1-87m deep No obvious discharge  10. Good muscle tone,  joints appear normal , no effusions, Normal ROM.  11. No Palpable Lymph Nodes in Neck or Axillae   Data Review:    CBC Recent Labs  Lab 03/12/18 2045  WBC 8.0  HGB 14.3  HCT 45.9  PLT 313  MCV 84.7  MCH 26.4  MCHC 31.2  RDW 12.8  LYMPHSABS 2.6  MONOABS 0.7  EOSABS 0.4  BASOSABS 0.0   ------------------------------------------------------------------------------------------------------------------  Chemistries  Recent Labs  Lab 03/12/18 2045  NA 133*  K 4.1  CL 101  CO2 26  GLUCOSE 279*  BUN 18  CREATININE 0.99  CALCIUM 9.1   ------------------------------------------------------------------------------------------------------------------ estimated creatinine clearance is 91.4 mL/min (by C-G formula based on SCr of 0.99 mg/dL). ------------------------------------------------------------------------------------------------------------------ No results for input(s): TSH, T4TOTAL, T3FREE, THYROIDAB in the last 72 hours.  Invalid input(s): FREET3  Coagulation profile No results for input(s): INR, PROTIME in the last 168 hours. ------------------------------------------------------------------------------------------------------------------- No results for input(s): DDIMER in the last 72 hours. -------------------------------------------------------------------------------------------------------------------  Cardiac Enzymes No results for input(s): CKMB, TROPONINI, MYOGLOBIN in the last 168 hours.  Invalid input(s): CK ------------------------------------------------------------------------------------------------------------------ No results found for: BNP   ---------------------------------------------------------------------------------------------------------------  Urinalysis    Component Value Date/Time   COLORURINE YELLOW  12/04/2014 [facility]07/17/2016 1551   LABSPEC <1.005 (L) 12/04/2014 1551   PHURINE 6.0 12/04/2014 1551   GLUCOSEU >1000 (A) 12/04/2014 1551   HGBUR NEGATIVE 12/04/2014 1551   BILIRUBINUR NEGATIVE 12/04/2014 1551   KETONESUR 40 (A) 12/04/2014 1551   PROTEINUR NEGATIVE 12/04/2014 1551   UROBILINOGEN 0.2 12/04/2014 1551   NITRITE NEGATIVE 12/04/2014 1551   LEUKOCYTESUR NEGATIVE 12/04/2014 1551    ----------------------------------------------------------------------------------------------------------------   Imaging Results:    Dg Ankle Complete Left  Result Date: 03/12/2018 CLINICAL DATA:  Ulcer to back of heel EXAM: LEFT ANKLE COMPLETE - 3+ VIEW COMPARISON:  12/07/2014 FINDINGS: Posterior and plantar calcaneal spurs. No radiographic changes of osteomyelitis. No fracture, subluxation or dislocation. Mild degenerative changes in the left ankle. IMPRESSION: No acute bony abnormality. Electronically Signed   By: KRolm BaptiseM.D.   On: 03/12/2018 21:28       Assessment & Plan:    Principal Problem:   Heel ulcer ([place] Active Problems:   Diabetes mellitus with neuropathy ([place]   Hypertension    Left heel ulcer / Cellulitis r/o osteomyelitis Wound culture Blood culture Check MRI heel r/o osteomyelitis due to pain Wound RN consult Start vanco iv, levaquin iv pharmacy to dose  Dm2 fsbs ac and qhs, ISS Cont Lantus Cont Novolog 16 units tid  Hypertension Cont lopressor 255mpo bid Cont Losartan 10057mo qday  Hyperlipidemia Cont Statin  Gerd Cont PPI  Bipolar Cont Latuda Cont Haldol  Dm2 neuropathy Cont Lyrica      DVT Prophylaxis  Lovenox - SCDs   AM Labs Ordered, also please review Full Orders  Family Communication: Admission, patients condition and plan of care including tests being ordered have been discussed with the patient  who indicate understanding and agree with the plan and Code Status.  Code Status  FULL CODE  Likely DC to   home  Condition GUARDED   Consults called:  none  Admission status:  Observation, pt will require hospitalization for IV abx and MRI r/o osteomyelitis,  Pt will likely need less than 2 nites stay, if heel not improving may require inpatient status  Time spent in minutes : [address] [place]  M.D on 03/12/2018 at 10:02 PM  Between 7am to 7pm - Pager - [phone]   After 7pm go to [email or web address] - password [facility]  [facility] - Office  [phone]

## 2018-03-12 NOTE — ED Notes (Signed)
Pt is from [facility].

## 2018-03-12 NOTE — ED Provider Notes (Addendum)
[facility] EMERGENCY DEPARTMENT Provider Note   CSN: [id number] Arrival date & time: 03/12/18  4403     History   Chief Complaint Chief Complaint  Patient presents with  . Diabetic Ulcer    HPI [name] is a 57 y.o. male.  Patient with painful foul-smelling ulcer left heel for the past 2 weeks, becoming progressively larger, more foul-smelling and more painful.  No fever no nausea or vomiting.  No other associated symptoms.  Patient went to the [facility] today however there was no doctor available to see him and he was told to go to an urgent care center who in turn referred him here treatment prior to coming nothing makes symptoms better or worse.  HPI  Past Medical History:  Diagnosis Date  . Anxiety   . Bipolar disorder ([place])   . Chronic pain   . Depression   . Diabetes mellitus without complication ([place])   . Diverticulitis   . GERD (gastroesophageal reflux disease)   . Hypertension   . IBS (irritable bowel syndrome)   . Kidney stone   . Migraine headache   . Neuropathy     Patient Active Problem List   Diagnosis Date Noted  . Chest pain 10/05/2017  . Bipolar disorder ([place])   . Fever 12/04/2014  . Cellulitis of left foot 12/04/2014  . Diabetes mellitus with neuropathy ([place]) 12/04/2014  . Hypertension 12/04/2014    Past Surgical History:  Procedure Laterality Date  . ANKLE SURGERY Right   . CARPAL TUNNEL RELEASE    . CHOLECYSTECTOMY    . COLONOSCOPY          Home Medications    Prior to Admission medications   Medication Sig Start Date End Date Taking? Authorizing Provider  aspirin EC 325 MG EC tablet Take 1 tablet (325 mg total) by mouth daily. 10/08/17   Kathie Dike, MD  atorvastatin (LIPITOR) 40 MG tablet Take 1 tablet (40 mg total) by mouth daily at 6 PM. 10/07/17   Kathie Dike, MD  DULoxetine (CYMBALTA) 30 MG capsule Take by mouth. 08/04/17   [provider]  escitalopram (LEXAPRO) 20 MG tablet Take 20 mg by mouth daily.     [provider]  insulin aspart (NOVOLOG) 100 UNIT/ML injection Inject into the skin.    [provider]  insulin aspart protamine- aspart (NOVOLOG MIX 70/30) (70-30) 100 UNIT/ML injection Inject 50 Units into the skin 2 (two) times daily.    [provider]  isosorbide mononitrate (IMDUR) 30 MG 24 hr tablet Take 1 tablet (30 mg total) by mouth daily. 10/08/17   Kathie Dike, MD  lisinopril (PRINIVIL,ZESTRIL) 20 MG tablet Take 20 mg by mouth daily.    [provider]  lurasidone (LATUDA) 40 MG TABS tablet Take 40 mg by mouth daily with breakfast.    [provider]  metFORMIN (GLUCOPHAGE) 1000 MG tablet Take 1,000 mg by mouth 3 (three) times daily.    [provider]  metoprolol (LOPRESSOR) 100 MG tablet Take 100 mg by mouth 2 (two) times daily.    [provider]  mirtazapine (REMERON) 15 MG tablet Take by mouth. 08/04/17   [provider]  nortriptyline (PAMELOR) 75 MG capsule Take 75 mg by mouth 3 (three) times daily.    [provider]  omeprazole (PRILOSEC) 20 MG capsule Take 20 mg by mouth daily.    [provider]  pregabalin (LYRICA) 200 MG capsule Take 200 mg by mouth 3 (three)  times daily.    [provider]  vitamin B-12 (CYANOCOBALAMIN) 100 MCG tablet Take 100 mcg by mouth daily.    [provider]  Vitamin D, Ergocalciferol, (DRISDOL) 50000 UNITS CAPS capsule Take 50,000 Units by mouth every 7 (seven) days. Usually takes on Wednesday    [provider]    Family History Family History  Problem Relation Age of Onset  . CAD Mother   . CAD Brother     Social History Social History   Tobacco Use  . Smoking status: Never Smoker  Substance Use Topics  . Alcohol use: No  . Drug use: Not Currently     Allergies   Meloxicam; Venlafaxine; and Penicillins   Review of Systems Review of Systems  Skin: Positive for wound.  Allergic/Immunologic: Positive for  immunocompromised state.       Diabetic  All other systems reviewed and are negative.    Physical Exam Updated Vital Signs BP (!) 191/91 (BP Location: Right Arm)   Pulse 69   Temp (!) 97.5 F (36.4 C) (Oral)   Resp 18   Ht 5' 10.5" (1.791 m)   Wt 89.8 kg   SpO2 99%   BMI 28.01 kg/m   Physical Exam  Constitutional:  chroonically ill-appearing  HENT:  Head: Normocephalic and atraumatic.  Eyes: Pupils are equal, round, and reactive to light. Conjunctivae are normal.  Neck: Neck supple. No tracheal deviation present. No thyromegaly present.  Cardiovascular: Normal rate and regular rhythm.  No murmur heard. Pulmonary/Chest: Effort normal and breath sounds normal.  Abdominal: Soft. Bowel sounds are normal. He exhibits no distension. There is no tenderness.  Musculoskeletal: Normal range of motion. He exhibits no edema or tenderness.  Left lower extremity there is a silver dollar sized ulcer which is scabbed over and foul-smelling and exquisitely tender at the posterior aspect of the ankle.  No redness proximally.  No drainage.  DP pulse 2+.  Good capillary refill.  All other extremities without redness swelling or tenderness neurovascular intact  Neurological: He is alert. Coordination normal.  Skin: Skin is warm and dry. No rash noted.  Psychiatric: He has a normal mood and affect.  Nursing note and vitals reviewed.    ED Treatments / Results  Labs (all labs ordered are listed, but only abnormal results are displayed) Labs Reviewed  CBC WITH DIFFERENTIAL/PLATELET  BASIC METABOLIC PANEL    EKG None  Radiology No results found. Results for orders placed or performed during the hospital encounter of 03/12/18  CBC with Differential/Platelet  Result Value Ref Range   WBC 8.0 4.0 - 10.5 K/uL   RBC 5.42 4.22 - 5.81 MIL/uL   Hemoglobin 14.3 13.0 - 17.0 g/dL   HCT 45.9 39.0 - 52.0 %   MCV 84.7 80.0 - 100.0 fL   MCH 26.4 26.0 - 34.0 pg   MCHC 31.2 30.0 - 36.0 g/dL   RDW  12.8 11.5 - 15.5 %   Platelets 313 150 - 400 K/uL   nRBC 0.0 0.0 - 0.2 %   Neutrophils Relative % 55 %   Neutro Abs 4.4 1.7 - 7.7 K/uL   Lymphocytes Relative 32 %   Lymphs Abs 2.6 0.7 - 4.0 K/uL   Monocytes Relative 8 %   Monocytes Absolute 0.7 0.1 - 1.0 K/uL   Eosinophils Relative 5 %   Eosinophils Absolute 0.4 0.0 - 0.5 K/uL   Basophils Relative 0 %   Basophils Absolute 0.0 0.0 - 0.1 K/uL   Immature  Granulocytes 0 %   Abs Immature Granulocytes 0.01 0.00 - 0.07 K/uL  Basic metabolic panel  Result Value Ref Range   Sodium 133 (L) 135 - 145 mmol/L   Potassium 4.1 3.5 - 5.1 mmol/L   Chloride 101 98 - 111 mmol/L   CO2 26 22 - 32 mmol/L   Glucose, Bld 279 (H) 70 - 99 mg/dL   BUN 18 6 - 20 mg/dL   Creatinine, Ser 0.99 0.61 - 1.24 mg/dL   Calcium 9.1 8.9 - 10.3 mg/dL   GFR calc non Af Amer >60 >60 mL/min   GFR calc Af Amer >60 >60 mL/min   Anion gap 6 5 - 15   Dg Ankle Complete Left  Result Date: 03/12/2018 CLINICAL DATA:  Ulcer to back of heel EXAM: LEFT ANKLE COMPLETE - 3+ VIEW COMPARISON:  12/07/2014 FINDINGS: Posterior and plantar calcaneal spurs. No radiographic changes of osteomyelitis. No fracture, subluxation or dislocation. Mild degenerative changes in the left ankle. IMPRESSION: No acute bony abnormality. Electronically Signed   By: Rolm Baptise M.D.   On: 03/12/2018 21:28   Procedures Procedures (including critical care time)  Medications Ordered in ED Medications - No data to display  xray viewed by me Lab data consistent with hyperglycemia Initial Impression / Assessment and Plan / ED Course  I have reviewed the triage vital signs and the nursing notes.  Pertinent labs & imaging results that were available during my care of the patient were reviewed by me and considered in my medical decision making (see chart for details).     9:55 PM pain improved after treatment with intravenous morphine.  Patient resting comfortably. IV antibiotics ordered.  Tdap updated.  Dr. Maudie Mercury from hospitalist service consulted will arrange for overnight stay.  Patient may need further imaging to check for osteomyelitis.  He will also need ongoing wound care Final Clinical Impressions(s) / ED Diagnoses  Dx #1 diabetic foot ulcer #2hyperglycemia Final diagnoses:  None    ED Discharge Orders    None       Orlie Dakin, MD 03/12/18 2205 CRITICAL CARE Performed by: Orlie Dakin Total critical care time: 30 minutes Critical care time was exclusive of separately billable procedures and treating other patients. Critical care was necessary to treat or prevent imminent or life-threatening deterioration. Critical care was time spent personally by me on the following activities: development of treatment plan with patient and/or surrogate as well as nursing, discussions with consultants, evaluation of patient's response to treatment, examination of patient, obtaining history from patient or surrogate, ordering and performing treatments and interventions, ordering and review of laboratory studies, ordering and review of radiographic studies, pulse oximetry and re-evaluation of patient's condition.   Orlie Dakin, MD 03/23/18 [phone]

## 2018-03-12 NOTE — ED Notes (Signed)
[phone] number to the administrator  of [facility]

## 2018-03-13 ENCOUNTER — Observation Stay (HOSPITAL_COMMUNITY): Payer: Medicare Other

## 2018-03-13 DIAGNOSIS — L03119 Cellulitis of unspecified part of limb: Secondary | ICD-10-CM

## 2018-03-13 DIAGNOSIS — E08621 Diabetes mellitus due to underlying condition with foot ulcer: Secondary | ICD-10-CM

## 2018-03-13 DIAGNOSIS — I10 Essential (primary) hypertension: Secondary | ICD-10-CM | POA: Diagnosis not present

## 2018-03-13 DIAGNOSIS — E1165 Type 2 diabetes mellitus with hyperglycemia: Secondary | ICD-10-CM | POA: Diagnosis not present

## 2018-03-13 DIAGNOSIS — Z8631 Personal history of diabetic foot ulcer: Secondary | ICD-10-CM | POA: Diagnosis not present

## 2018-03-13 DIAGNOSIS — E11628 Type 2 diabetes mellitus with other skin complications: Secondary | ICD-10-CM

## 2018-03-13 DIAGNOSIS — L97402 Non-pressure chronic ulcer of unspecified heel and midfoot with fat layer exposed: Secondary | ICD-10-CM | POA: Diagnosis not present

## 2018-03-13 DIAGNOSIS — R740 Nonspecific elevation of levels of transaminase and lactic acid dehydrogenase [LDH]: Secondary | ICD-10-CM

## 2018-03-13 DIAGNOSIS — L97429 Non-pressure chronic ulcer of left heel and midfoot with unspecified severity: Secondary | ICD-10-CM | POA: Diagnosis not present

## 2018-03-13 DIAGNOSIS — R7401 Elevation of levels of liver transaminase levels: Secondary | ICD-10-CM

## 2018-03-13 DIAGNOSIS — L97509 Non-pressure chronic ulcer of other part of unspecified foot with unspecified severity: Secondary | ICD-10-CM

## 2018-03-13 DIAGNOSIS — F3130 Bipolar disorder, current episode depressed, mild or moderate severity, unspecified: Secondary | ICD-10-CM | POA: Diagnosis not present

## 2018-03-13 DIAGNOSIS — E11621 Type 2 diabetes mellitus with foot ulcer: Secondary | ICD-10-CM

## 2018-03-13 LAB — COMPREHENSIVE METABOLIC PANEL
ALBUMIN: 3.3 g/dL — AB (ref 3.5–5.0)
ALT: 133 U/L — ABNORMAL HIGH (ref 0–44)
AST: 247 U/L — ABNORMAL HIGH (ref 15–41)
Alkaline Phosphatase: 142 U/L — ABNORMAL HIGH (ref 38–126)
Anion gap: 7 (ref 5–15)
BUN: 18 mg/dL (ref 6–20)
CHLORIDE: 102 mmol/L (ref 98–111)
CO2: 28 mmol/L (ref 22–32)
CREATININE: 1.05 mg/dL (ref 0.61–1.24)
Calcium: 8.9 mg/dL (ref 8.9–10.3)
GFR calc Af Amer: >60 mL/min (ref >60)
GLUCOSE: 210 mg/dL — AB (ref 70–99)
Potassium: 3.8 mmol/L (ref 3.5–5.1)
Sodium: 137 mmol/L (ref 135–145)
Total Bilirubin: 1.6 mg/dL — ABNORMAL HIGH (ref 0.3–1.2)
Total Protein: 7.1 g/dL (ref 6.5–8.1)

## 2018-03-13 LAB — CBC
HEMATOCRIT: 43.7 % (ref 39.0–52.0)
Hemoglobin: 13.8 g/dL (ref 13.0–17.0)
MCH: 27.4 pg (ref 26.0–34.0)
MCHC: 31.6 g/dL (ref 30.0–36.0)
MCV: 86.7 fL (ref 80.0–100.0)
Platelets: 300 10*3/uL (ref 150–400)
RBC: 5.04 MIL/uL (ref 4.22–5.81)
RDW: 12.7 % (ref 11.5–15.5)
WBC: 7.6 10*3/uL (ref 4.0–10.5)
nRBC: 0 % (ref 0.0–0.2)

## 2018-03-13 LAB — GLUCOSE, CAPILLARY
GLUCOSE-CAPILLARY: 204 mg/dL — AB (ref 70–99)
GLUCOSE-CAPILLARY: 223 mg/dL — AB (ref 70–99)
Glucose-Capillary: 231 mg/dL — ABNORMAL HIGH (ref 70–99)
Glucose-Capillary: 181 mg/dL — ABNORMAL HIGH (ref 70–99)

## 2018-03-13 LAB — HEMOGLOBIN A1C
Hgb A1c MFr Bld: 9.7 % — ABNORMAL HIGH (ref 4.8–5.6)
MEAN PLASMA GLUCOSE: 231.69 mg/dL

## 2018-03-13 LAB — MRSA PCR SCREENING: MRSA BY PCR: NEGATIVE

## 2018-03-13 LAB — C-REACTIVE PROTEIN: CRP: 1.8 mg/dL — ABNORMAL HIGH (ref <1.0)

## 2018-03-13 LAB — ETHANOL: Alcohol, Ethyl (B): <10 mg/dL (ref <10)

## 2018-03-13 MED ORDER — PRAZOSIN HCL 1 MG PO CAPS
2.0000 mg | ORAL_CAPSULE | Freq: Every day | ORAL | Status: DC
  Administered 2018-03-13 – 2018-03-14 (×2): 2 mg via ORAL
  Filled 2018-03-13: qty 2
  Filled 2018-03-13: qty 1
  Filled 2018-03-13 (×2): qty 2

## 2018-03-13 MED ORDER — LOSARTAN POTASSIUM 50 MG PO TABS
100.0000 mg | ORAL_TABLET | Freq: Every day | ORAL | Status: DC
  Administered 2018-03-13 – 2018-03-15 (×3): 100 mg via ORAL
  Filled 2018-03-13 (×3): qty 2

## 2018-03-13 MED ORDER — ONDANSETRON HCL 4 MG PO TABS
4.0000 mg | ORAL_TABLET | Freq: Three times a day (TID) | ORAL | Status: DC
  Administered 2018-03-13 – 2018-03-15 (×7): 4 mg via ORAL
  Filled 2018-03-13 (×7): qty 1

## 2018-03-13 MED ORDER — METOPROLOL TARTRATE 25 MG PO TABS
25.0000 mg | ORAL_TABLET | Freq: Two times a day (BID) | ORAL | Status: DC
  Administered 2018-03-13 – 2018-03-15 (×6): 25 mg via ORAL
  Filled 2018-03-13 (×6): qty 1

## 2018-03-13 MED ORDER — PANCRELIPASE (LIP-PROT-AMYL) 12000-38000 UNITS PO CPEP
36000.0000 [IU] | ORAL_CAPSULE | Freq: Three times a day (TID) | ORAL | Status: DC
  Administered 2018-03-13 – 2018-03-15 (×7): 36000 [IU] via ORAL
  Filled 2018-03-13 (×8): qty 3

## 2018-03-13 MED ORDER — INSULIN GLARGINE 100 UNIT/ML ~~LOC~~ SOLN
20.0000 [IU] | Freq: Every day | SUBCUTANEOUS | Status: DC
  Administered 2018-03-14: 20 [IU] via SUBCUTANEOUS
  Filled 2018-03-13 (×3): qty 0.2

## 2018-03-13 MED ORDER — INSULIN ASPART 100 UNIT/ML ~~LOC~~ SOLN
0.0000 [IU] | Freq: Three times a day (TID) | SUBCUTANEOUS | Status: DC
  Administered 2018-03-13 (×2): 5 [IU] via SUBCUTANEOUS
  Administered 2018-03-13: 3 [IU] via SUBCUTANEOUS
  Administered 2018-03-14: 5 [IU] via SUBCUTANEOUS
  Administered 2018-03-14: 8 [IU] via SUBCUTANEOUS
  Administered 2018-03-14: 3 [IU] via SUBCUTANEOUS
  Administered 2018-03-15: 5 [IU] via SUBCUTANEOUS

## 2018-03-13 MED ORDER — GADOBUTROL 1 MMOL/ML IV SOLN
9.0000 mL | Freq: Once | INTRAVENOUS | Status: AC | PRN
  Administered 2018-03-13: 9 mL via INTRAVENOUS

## 2018-03-13 MED ORDER — INSULIN ASPART 100 UNIT/ML ~~LOC~~ SOLN
0.0000 [IU] | Freq: Every day | SUBCUTANEOUS | Status: DC
  Administered 2018-03-13: 2 [IU] via SUBCUTANEOUS

## 2018-03-13 MED ORDER — INSULIN GLARGINE 100 UNIT/ML ~~LOC~~ SOLN
35.0000 [IU] | Freq: Every day | SUBCUTANEOUS | Status: DC
  Filled 2018-03-13 (×4): qty 0.35

## 2018-03-13 MED ORDER — PANTOPRAZOLE SODIUM 40 MG PO TBEC
40.0000 mg | DELAYED_RELEASE_TABLET | Freq: Every day | ORAL | Status: DC
  Administered 2018-03-13 – 2018-03-15 (×3): 40 mg via ORAL
  Filled 2018-03-13 (×3): qty 1

## 2018-03-13 MED ORDER — DULOXETINE HCL 60 MG PO CPEP
60.0000 mg | ORAL_CAPSULE | Freq: Every day | ORAL | Status: DC
  Administered 2018-03-13: 60 mg via ORAL
  Filled 2018-03-13: qty 1

## 2018-03-13 MED ORDER — PREGABALIN 75 MG PO CAPS
200.0000 mg | ORAL_CAPSULE | Freq: Two times a day (BID) | ORAL | Status: DC
  Administered 2018-03-13 – 2018-03-15 (×6): 200 mg via ORAL
  Filled 2018-03-13 (×6): qty 1

## 2018-03-13 MED ORDER — PRAVASTATIN SODIUM 40 MG PO TABS
40.0000 mg | ORAL_TABLET | Freq: Every day | ORAL | Status: DC
  Administered 2018-03-13 (×2): 40 mg via ORAL
  Filled 2018-03-13 (×2): qty 1

## 2018-03-13 MED ORDER — HALOPERIDOL 5 MG PO TABS
5.0000 mg | ORAL_TABLET | Freq: Two times a day (BID) | ORAL | Status: DC
  Administered 2018-03-13 (×2): 5 mg via ORAL
  Filled 2018-03-13 (×2): qty 1

## 2018-03-13 MED ORDER — HYDROXYZINE HCL 25 MG PO TABS
50.0000 mg | ORAL_TABLET | Freq: Two times a day (BID) | ORAL | Status: DC | PRN
  Administered 2018-03-15: 50 mg via ORAL
  Filled 2018-03-13: qty 2

## 2018-03-13 MED ORDER — INSULIN ASPART 100 UNIT/ML ~~LOC~~ SOLN: 16.0000 [IU] | Freq: Three times a day (TID) | SUBCUTANEOUS | Status: DC

## 2018-03-13 MED ORDER — LURASIDONE HCL 40 MG PO TABS
40.0000 mg | ORAL_TABLET | Freq: Every day | ORAL | Status: DC
  Administered 2018-03-13 – 2018-03-14 (×2): 40 mg via ORAL
  Filled 2018-03-13 (×2): qty 1

## 2018-03-13 NOTE — Care Management Note (Signed)
Case Management Note  Patient Details  Name: FAHD GALEA MRN: [id number] Date of Birth: May 29, 1960  Subjective/Objective:       Admitted with food ulcer. Pt from [facility]. Will need [place] nursing at DC. [facility] requests [facility].              Action/Plan: Referral given to [facility] rep, Linda. Anticipate DC this weekend. LCSW to facility return to facility.   Expected Discharge Date:      03/13/18            Expected Discharge Plan:  Group Home  In-House Referral:  Clinical Social Work  Discharge planning Services  CM Consult  Post Acute Care Choice:  Home Health Choice offered to:  NA  HH Arranged:  RN [place] Agency:  [facility]  Status of Service:  Completed, signed off  If discussed at [employer] of Stay Meetings, dates discussed:    Additional Comments:  Sherald Barge, RN 03/13/2018, 12:47 PM

## 2018-03-13 NOTE — Clinical Social Work Note (Signed)
CSW spoke to Ms [name] and alerted her that pt would likely d/c this weekend.  She confirmed he has no MCD and that they would like services set up through [facility].

## 2018-03-13 NOTE — Progress Notes (Signed)
Performed dressing change as instructed. Cleaned wound with sterile water. Applied hydrogel to wound on left heel. Covered with dry guaze and covered with kerlex. On right foot I painted intact ulceration with betadine and cover with foam dressing

## 2018-03-13 NOTE — Progress Notes (Signed)
PROGRESS NOTE  [name] YIF:[id number] DOB: [birth year] DOA: 03/12/2018 PCP: Patient, No Pcp Per  Brief History:  57 year old male with a history of bipolar disorder, diabetes mellitus, hypertension, migraine headache, systolic and diastolic CHF presented from his group home with 2-week history of left heel ulcer that was increasing in size, pain, and drainage.  The patient went to the [place] in [place], but he was not able to be seen and was sent to urgent care center 2 days prior to this admission.  He was subsequently advised to go to the emergency department for further evaluation.  The patient stated that his left heel wound began as a blister which he feels may been partly due to ill fitting shoes.  He denies going barefoot.  He denies any other recent injuries.  He denies any fevers, chills, chest pain, shortness breath, cough, hemoptysis.  He has had some abdominal pain with nausea and vomiting to 3 days prior to this admission, but this has resolved.  Upon presentation, the patient was afebrile hemodynamically stable with WBC 8.0 and sedimentation rate 30.  X-rays of the left ankle did not show any erosions.  Because of concerns for infection and his diabetic foot ulcer, the patient was admitted for IV antibiotics and further work-up.  Assessment/Plan: Infected diabetic foot ulcer/cellulitis of the foot -Discontinue levofloxacin -Continue vancomycin -Check CRP -MRI left foot -Wound care consult -check ABI  Diabetes mellitus type 2, uncontrolled with hyperglycemia -Start reduced dose Lantus -Hemoglobin A1c -NovoLog sliding scale  Transaminasemia -Hepatitis B surface antigen -Hepatitis C antibody -Right upper quadrant [place] -Alcohol level -Urine drug screen -A.m. LFTs -Suspect drug-induced -Discontinue Haldol  Essential hypertension -Continue prazosin, losartan and metoprolol tartrate  Bipolar disorder -Continue Latuda  Hyperlipidemia -Continue  statin  Exocrine pancreatic insufficiency -Continue Creon   Disposition Plan:   Home in 2-3 days  Family Communication:  No Family at bedside  Consultants:  none  Code Status:  FULL   DVT Prophylaxis:Ismay Lovenox   Procedures: As Listed in Progress Note Above  Antibiotics: None    Subjective: Patient continues to complain of his left foot pain.  He denies any fevers, chills, chest pain, shortness breath, nausea, vomiting, diarrhea, dysuria, hematuria.  He has some pain with weightbearing.  Objective: Vitals:   03/12/18 2315 03/13/18 0141 03/13/18 0500 03/13/18 0537  BP: (!) 150/86 112/63  137/77  Pulse: 67 63  (!) 58  Resp: 18   17  Temp: 97.7 F (36.5 C)   97.7 F (36.5 C)  TempSrc: Oral   Oral  SpO2: 97% 95%  98%  Weight: 88.7 kg  88.6 kg   Height: 5\' 10"  (1.778 m)       Intake/Output Summary (Last 24 hours) at 03/13/2018 0815 Last data filed at 03/13/2018 0600 Gross per 24 hour  Intake 586.46 ml  Output 200 ml  Net 386.46 ml   Weight change:  Exam:   General:  Pt is alert, follows commands appropriately, not in acute distress  HEENT: No icterus, No thrush, No neck mass, [place]/AT  Cardiovascular: RRR, S1/S2, no rubs, no gallops  Respiratory: Bibasilar crackles but no wheezing.  Good air movement.  Abdomen: Soft/+BS, non tender, non distended, no guarding  Extremities: No edema, No lymphangitis, No petechiae, No rashes, no synovitis   Data Reviewed: I have personally reviewed following labs and imaging studies Basic Metabolic Panel: Recent Labs  Lab 03/12/18 2045 03/13/18 0541  NA 133*  137  K 4.1 3.8  CL 101 102  CO2 26 28  GLUCOSE 279* 210*  BUN 18 18  CREATININE 0.99 1.05  CALCIUM 9.1 8.9   Liver Function Tests: Recent Labs  Lab 03/13/18 0541  AST 247*  ALT 133*  ALKPHOS 142*  BILITOT 1.6*  PROT 7.1  ALBUMIN 3.3*   No results for input(s): LIPASE, AMYLASE in the last 168 hours. No results for input(s): AMMONIA in the last 168  hours. Coagulation Profile: No results for input(s): INR, PROTIME in the last 168 hours. CBC: Recent Labs  Lab 03/12/18 2045 03/13/18 0541  WBC 8.0 7.6  NEUTROABS 4.4  --   HGB 14.3 13.8  HCT 45.9 43.7  MCV 84.7 86.7  PLT 313 300   Cardiac Enzymes: No results for input(s): CKTOTAL, CKMB, CKMBINDEX, TROPONINI in the last 168 hours. BNP: Invalid input(s): POCBNP CBG: Recent Labs  Lab 03/12/18 2242 03/12/18 2313 03/13/18 0752  GLUCAP 272* 250* 231*   HbA1C: No results for input(s): HGBA1C in the last 72 hours. Urine analysis:    Component Value Date/Time   COLORURINE YELLOW 12/04/2014 [place] 12/04/2014 1551   LABSPEC <1.005 (L) 12/04/2014 1551   PHURINE 6.0 12/04/2014 1551   GLUCOSEU >1000 (A) 12/04/2014 1551   HGBUR NEGATIVE 12/04/2014 1551   BILIRUBINUR NEGATIVE 12/04/2014 1551   KETONESUR 40 (A) 12/04/2014 1551   PROTEINUR NEGATIVE 12/04/2014 1551   UROBILINOGEN 0.2 12/04/2014 1551   NITRITE NEGATIVE 12/04/2014 1551   LEUKOCYTESUR NEGATIVE 12/04/2014 1551   Sepsis Labs: @LABRCNTIP (procalcitonin:4,lacticidven:4) ) Recent Results (from the past 240 hour(s))  Culture, blood (Routine X 2) w Reflex to ID Panel     Status: None (Preliminary result)   Collection Time: 03/12/18 10:23 PM  Result Value Ref Range Status   Specimen Description BLOOD LEFT ANTECUBITAL  Final   Special Requests   Final    BOTTLES DRAWN AEROBIC AND ANAEROBIC Blood Culture adequate volume   Culture   Final    NO GROWTH < 12 HOURS Performed at [facility], [address] [place], [place] [address]    Report Status PENDING  Incomplete  Culture, blood (Routine X 2) w Reflex to ID Panel     Status: None (Preliminary result)   Collection Time: 03/12/18 10:28 PM  Result Value Ref Range Status   Specimen Description BLOOD LEFT ANTECUBITAL  Final   Special Requests   Final    BOTTLES DRAWN AEROBIC AND ANAEROBIC Blood Culture adequate volume   Culture   Final    NO GROWTH  < 12 HOURS Performed at [facility], [address] [place], [place] [address]    Report Status PENDING  Incomplete  MRSA PCR Screening     Status: None   Collection Time: 03/13/18 12:32 AM  Result Value Ref Range Status   MRSA by PCR NEGATIVE NEGATIVE Final    Comment:        The GeneXpert MRSA Assay (FDA approved for NASAL specimens only), is one component of a comprehensive MRSA colonization surveillance program. It is not intended to diagnose MRSA infection nor to guide or monitor treatment for MRSA infections. Performed at [facility], [address] [place], [place] [address]      Scheduled Meds: . DULoxetine  60 mg Oral Daily  . enoxaparin (LOVENOX) injection  40 mg Subcutaneous Q24H  . haloperidol  5 mg Oral BID  . insulin aspart  0-5 Units Subcutaneous QHS  . insulin aspart  0-9  Units Subcutaneous TID WC  . insulin aspart  16 Units Subcutaneous TID  . insulin glargine  35 Units Subcutaneous QAC breakfast  . lipase/protease/amylase  36,000 Units Oral TID WC  . losartan  100 mg Oral Daily  . lurasidone  40 mg Oral Q supper  . metoprolol tartrate  25 mg Oral BID  . ondansetron  4 mg Oral TID  . pantoprazole  40 mg Oral QAC breakfast  . pravastatin  40 mg Oral QHS  . prazosin  2 mg Oral QHS  . pregabalin  200 mg Oral BID   Continuous Infusions: . sodium chloride 50 mL/hr at 03/12/18 2300  . levofloxacin (LEVAQUIN) IV    . vancomycin 1,000 mg (03/12/18 2330)    Procedures/Studies: Dg Ankle Complete Left  Result Date: 03/12/2018 CLINICAL DATA:  Ulcer to back of heel EXAM: LEFT ANKLE COMPLETE - 3+ VIEW COMPARISON:  12/07/2014 FINDINGS: Posterior and plantar calcaneal spurs. No radiographic changes of osteomyelitis. No fracture, subluxation or dislocation. Mild degenerative changes in the left ankle. IMPRESSION: No acute bony abnormality. Electronically Signed   By: Rolm Baptise M.D.   On: 03/12/2018 21:28    Orson Eva, DO  [facility] Pager  [phone]  If 7PM-7AM, please contact night-coverage [email or web address] Password [email or web address] 03/13/2018, 8:15 AM   LOS: 0 days

## 2018-03-13 NOTE — Consult Note (Signed)
WOC reviewed images and MRI results, discussed wounds with bedside nurse and used images for discussion. ABIs normal and patient per bedside nurse has palpable distal pulses.   Topical care ordered for the bilateral foot wounds, neuropathic in nature.  Patient is ambulatory so I will not add any type of offloading boots.  Discussed POC with bedside nurse.  Re consult if needed, will not follow at this time. Thanks  Nickolai Rinks [employer], RN,CWOCN, CNS, [place] [phone])

## 2018-03-13 NOTE — Care Management Obs Status (Signed)
[facility] NOTIFICATION   Patient Details  Name: [name] MRN: [id number] Date of Birth: 12-Jul-1960   Medicare Observation Status Notification Given:  Yes    Shelda Altes 03/13/2018, 2:24 PM

## 2018-03-14 DIAGNOSIS — F3130 Bipolar disorder, current episode depressed, mild or moderate severity, unspecified: Secondary | ICD-10-CM | POA: Diagnosis not present

## 2018-03-14 DIAGNOSIS — L03119 Cellulitis of unspecified part of limb: Secondary | ICD-10-CM | POA: Diagnosis not present

## 2018-03-14 DIAGNOSIS — E08621 Diabetes mellitus due to underlying condition with foot ulcer: Secondary | ICD-10-CM | POA: Diagnosis not present

## 2018-03-14 DIAGNOSIS — R740 Nonspecific elevation of levels of transaminase and lactic acid dehydrogenase [LDH]: Secondary | ICD-10-CM | POA: Diagnosis not present

## 2018-03-14 DIAGNOSIS — L97401 Non-pressure chronic ulcer of unspecified heel and midfoot limited to breakdown of skin: Secondary | ICD-10-CM

## 2018-03-14 DIAGNOSIS — E11628 Type 2 diabetes mellitus with other skin complications: Secondary | ICD-10-CM | POA: Diagnosis not present

## 2018-03-14 DIAGNOSIS — E1165 Type 2 diabetes mellitus with hyperglycemia: Secondary | ICD-10-CM | POA: Diagnosis not present

## 2018-03-14 LAB — COMPREHENSIVE METABOLIC PANEL
ALT: 173 U/L — AB (ref 0–44)
AST: 118 U/L — AB (ref 15–41)
Albumin: 3.4 g/dL — ABNORMAL LOW (ref 3.5–5.0)
Alkaline Phosphatase: 147 U/L — ABNORMAL HIGH (ref 38–126)
Anion gap: 8 (ref 5–15)
BUN: 20 mg/dL (ref 6–20)
CALCIUM: 9 mg/dL (ref 8.9–10.3)
CHLORIDE: 102 mmol/L (ref 98–111)
CO2: 27 mmol/L (ref 22–32)
CREATININE: 1.29 mg/dL — AB (ref 0.61–1.24)
GFR calc Af Amer: >60 mL/min (ref >60)
GFR calc non Af Amer: 59 mL/min — ABNORMAL LOW (ref >60)
Glucose, Bld: 212 mg/dL — ABNORMAL HIGH (ref 70–99)
POTASSIUM: 4.1 mmol/L (ref 3.5–5.1)
SODIUM: 137 mmol/L (ref 135–145)
TOTAL PROTEIN: 7.2 g/dL (ref 6.5–8.1)
Total Bilirubin: 1.4 mg/dL — ABNORMAL HIGH (ref 0.3–1.2)

## 2018-03-14 LAB — GLUCOSE, CAPILLARY
GLUCOSE-CAPILLARY: 214 mg/dL — AB (ref 70–99)
GLUCOSE-CAPILLARY: 295 mg/dL — AB (ref 70–99)
GLUCOSE-CAPILLARY: 180 mg/dL — AB (ref 70–99)
GLUCOSE-CAPILLARY: 141 mg/dL — AB (ref 70–99)

## 2018-03-14 LAB — HIV ANTIBODY (ROUTINE TESTING W REFLEX): HIV Screen 4th Generation wRfx: NONREACTIVE

## 2018-03-14 LAB — HEPATITIS B SURFACE ANTIGEN: HEP B S AG: NEGATIVE

## 2018-03-14 LAB — HEPATITIS C ANTIBODY

## 2018-03-14 MED ORDER — SODIUM CHLORIDE 0.9 % IV SOLN
INTRAVENOUS | Status: DC
  Administered 2018-03-14 (×2): via INTRAVENOUS

## 2018-03-14 NOTE — Progress Notes (Signed)
PROGRESS NOTE  [name] TKP:[id number] DOB: 12-14-1960 DOA: 03/12/2018 PCP: Patient, No Pcp Per  Brief History:  57 year old male with a history of bipolar disorder, diabetes mellitus, hypertension, migraine headache, systolic and diastolic CHF presented from his group home with 2-week history of left heel ulcer that was increasing in size, pain, and drainage.  The patient went to the [facility] in [place], but he was not able to be seen and was sent to urgent care center 2 days prior to this admission.  He was subsequently advised to go to the emergency department for further evaluation.  The patient stated that his left heel wound began as a blister which he feels may been partly due to ill fitting shoes.  He denies going barefoot.  He denies any other recent injuries.  He denies any fevers, chills, chest pain, shortness breath, cough, hemoptysis.  He has had some abdominal pain with nausea and vomiting to 3 days prior to this admission, but this has resolved.  Upon presentation, the patient was afebrile hemodynamically stable with WBC 8.0 and sedimentation rate 30.  X-rays of the left ankle did not show any erosions.  Because of concerns for infection and his diabetic foot ulcer, the patient was admitted for IV antibiotics and further work-up.  Assessment/Plan: Infected diabetic foot ulcer/cellulitis of the foot -Discontinue levofloxacin -Continue vancomycin -Check CRP -03/13/18--MRI left foot--no osteomyelitis or abscess -Wound care consult-->Cleaned wound with sterile water. Applied hydrogel to wound on left heel. Covered with dry guaze and covered with kerlex. On right foot I painted intact ulceration with betadine and cover with foam dressing -check ABI-->normal bilateral  Diabetes mellitus type 2, uncontrolled with hyperglycemia -Increase Lantus to 30 units -Hemoglobin A1c--9.7 -NovoLog sliding scale -novolog 3 units with meals  Transaminasemia -Hepatitis B surface  antigen--neg -Hepatitis C antibody--neg -Right upper quadrant US--neg -Alcohol level--neg -Urine drug screen-- -A.m. LFTs -Suspect drug-induced -Discontinue Haldol and statin  Essential hypertension -Continue prazosin, losartan and metoprolol tartrate  Bipolar disorder -Continue Latuda  Hyperlipidemia -Discontinue statin due to elevated LFTs  Exocrine pancreatic insufficiency -Continue Creon   Disposition Plan:   Home 10/27 if stable Family Communication:  No Family at bedside  Consultants:  none  Code Status:  FULL   DVT Prophylaxis:[place] Lovenox   Procedures: As Listed in Progress Note Above  Antibiotics: vanco 10/24>>> cipro 10/24 x 1    Subjective: Pain in left heel is improving.  Patient denies fevers, chills, headache, chest pain, dyspnea, nausea, vomiting, diarrhea, abdominal pain, dysuria, hematuria, hematochezia, and melena.   Objective: Vitals:   03/14/18 0500 03/14/18 0533 03/14/18 0821 03/14/18 1337  BP:  (!) 150/80  117/60  Pulse:  (!) 52 65 62  Resp:  20  18  Temp:  98.5 F (36.9 C)  98.9 F (37.2 C)  TempSrc:  Oral  Oral  SpO2:  96%  91%  Weight: 88.4 kg     Height:        Intake/Output Summary (Last 24 hours) at 03/14/2018 1723 Last data filed at 03/14/2018 1600 Gross per 24 hour  Intake 960 ml  Output 2700 ml  Net -1740 ml   Weight change: -1.412 kg Exam:   General:  Pt is alert, follows commands appropriately, not in acute distress  HEENT: No icterus, No thrush, No neck mass, [place]/AT  Cardiovascular: RRR, S1/S2, no rubs, no gallops  Respiratory: CTA bilaterally, no wheezing, no crackles, no rhonchi  Abdomen: Soft/+BS, non  tender, non distended, no guarding  Extremities: No edema, No lymphangitis, No petechiae, No rashes, no synovitis;  L heel ulcer with good granulation--no necrosis, no pus; minimal erythema   Data Reviewed: I have personally reviewed following labs and imaging studies Basic Metabolic  Panel: Recent Labs  Lab 03/12/18 2045 03/13/18 0541 03/14/18 0545  NA 133* 137 137  K 4.1 3.8 4.1  CL 101 102 102  CO2 26 28 27   GLUCOSE 279* 210* 212*  BUN 18 18 20   CREATININE 0.99 1.05 1.29*  CALCIUM 9.1 8.9 9.0   Liver Function Tests: Recent Labs  Lab 03/13/18 0541 03/14/18 0545  AST 247* 118*  ALT 133* 173*  ALKPHOS 142* 147*  BILITOT 1.6* 1.4*  PROT 7.1 7.2  ALBUMIN 3.3* 3.4*   No results for input(s): LIPASE, AMYLASE in the last 168 hours. No results for input(s): AMMONIA in the last 168 hours. Coagulation Profile: No results for input(s): INR, PROTIME in the last 168 hours. CBC: Recent Labs  Lab 03/12/18 2045 03/13/18 0541  WBC 8.0 7.6  NEUTROABS 4.4  --   HGB 14.3 13.8  HCT 45.9 43.7  MCV 84.7 86.7  PLT 313 300   Cardiac Enzymes: No results for input(s): CKTOTAL, CKMB, CKMBINDEX, TROPONINI in the last 168 hours. BNP: Invalid input(s): POCBNP CBG: Recent Labs  Lab 03/13/18 1623 03/13/18 2152 03/14/18 0754 03/14/18 1132 03/14/18 1615  GLUCAP 204* 223* 214* 295* 180*   HbA1C: Recent Labs    03/13/18 0846  HGBA1C 9.7*   Urine analysis:    Component Value Date/Time   COLORURINE YELLOW 12/04/2014 [place] 12/04/2014 1551   LABSPEC <1.005 (L) 12/04/2014 1551   PHURINE 6.0 12/04/2014 1551   GLUCOSEU >1000 (A) 12/04/2014 1551   HGBUR NEGATIVE 12/04/2014 1551   BILIRUBINUR NEGATIVE 12/04/2014 1551   KETONESUR 40 (A) 12/04/2014 1551   PROTEINUR NEGATIVE 12/04/2014 1551   UROBILINOGEN 0.2 12/04/2014 1551   NITRITE NEGATIVE 12/04/2014 1551   LEUKOCYTESUR NEGATIVE 12/04/2014 1551   Sepsis Labs: @LABRCNTIP (procalcitonin:4,lacticidven:4) ) Recent Results (from the past 240 hour(s))  Culture, blood (Routine X 2) w Reflex to ID Panel     Status: None (Preliminary result)   Collection Time: 03/12/18 10:23 PM  Result Value Ref Range Status   Specimen Description BLOOD LEFT ANTECUBITAL  Final   Special Requests   Final     BOTTLES DRAWN AEROBIC AND ANAEROBIC Blood Culture adequate volume   Culture   Final    NO GROWTH 2 DAYS Performed at [facility], [address] [place], [place] [address]    Report Status PENDING  Incomplete  Culture, blood (Routine X 2) w Reflex to ID Panel     Status: None (Preliminary result)   Collection Time: 03/12/18 10:28 PM  Result Value Ref Range Status   Specimen Description BLOOD LEFT ANTECUBITAL  Final   Special Requests   Final    BOTTLES DRAWN AEROBIC AND ANAEROBIC Blood Culture adequate volume   Culture   Final    NO GROWTH 2 DAYS Performed at [facility], [address], [place], [place] [address]    Report Status PENDING  Incomplete  MRSA PCR Screening     Status: None   Collection Time: 03/13/18 12:32 AM  Result Value Ref Range Status   MRSA by PCR NEGATIVE NEGATIVE Final    Comment:        The GeneXpert MRSA Assay (FDA approved for NASAL specimens only), is one component of a  comprehensive MRSA colonization surveillance program. It is not intended to diagnose MRSA infection nor to guide or monitor treatment for MRSA infections. Performed at [facility], [address] [place], [place] [address]      Scheduled Meds: . enoxaparin (LOVENOX) injection  40 mg Subcutaneous Q24H  . insulin aspart  0-15 Units Subcutaneous TID WC  . insulin aspart  0-5 Units Subcutaneous QHS  . insulin glargine  20 Units Subcutaneous QAC breakfast  . lipase/protease/amylase  36,000 Units Oral TID WC  . losartan  100 mg Oral Daily  . lurasidone  40 mg Oral Q supper  . metoprolol tartrate  25 mg Oral BID  . ondansetron  4 mg Oral TID  . pantoprazole  40 mg Oral QAC breakfast  . prazosin  2 mg Oral QHS  . pregabalin  200 mg Oral BID   Continuous Infusions: . vancomycin 1,000 mg (03/14/18 1039)    Procedures/Studies: Dg Ankle Complete Left  Result Date: 03/12/2018 CLINICAL DATA:  Ulcer to back of heel EXAM: LEFT ANKLE COMPLETE - 3+ VIEW COMPARISON:  12/07/2014  FINDINGS: Posterior and plantar calcaneal spurs. No radiographic changes of osteomyelitis. No fracture, subluxation or dislocation. Mild degenerative changes in the left ankle. IMPRESSION: No acute bony abnormality. Electronically Signed   By: Rolm Baptise M.D.   On: 03/12/2018 21:28   [name]  Result Date: 03/13/2018 CLINICAL DATA:  Soft tissue ulceration of the left heel.  Heel pain. EXAM: MRI OF LOWER LEFT EXTREMITY WITHOUT AND WITH CONTRAST TECHNIQUE: Multiplanar, multisequence MR imaging of the left hindfoot was performed both before and after administration of intravenous contrast. CONTRAST:  9 cc Gadavist COMPARISON:  Radiographs dated 03/12/2018 FINDINGS: Bones/Joint/Cartilage There is no evidence of osteomyelitis or other acute bone abnormality. There are focal arthritic changes of the superolateral aspect of the dome of the talus with subcortical cyst formation and focal subcortical edema, probably due to overlying cartilage loss. Ligaments The ligaments of the medial and lateral aspects the ankle are intact. Muscles and Tendons The tendons around the ankle are intact. There are minimal chronic degenerative changes of the distal Achilles tendon. Soft tissues There is a shallow 2.7 cm diameter soft tissue ulceration adjacent to the posteromedial aspect of the distal Achilles tendon. There is no underlying abscess. After contrast administration there is enhancement of the soft tissues deep to the ulceration without an abscess. There is also an adjacent area of devitalized subcutaneous fat measuring 4.6 x 4.2 x 1.6 cm at the posterior aspect of the heel. This devitalized tissue is best seen on image 29 of series 9 and image 2 of series 10. IMPRESSION: 1. No evidence of osteomyelitis of the left calcaneus. 2. Soft tissue ulceration at the posterior aspect of the left heel without underlying abscess. Adjacent cellulitis. 3. Large area of devitalized subcutaneous fat adjacent to the  ulceration. Electronically Signed   By: Lorriane Shire M.D.   On: 03/13/2018 11:27   US Arterial Abi (screening Lower Extremity)  Result Date: 03/13/2018 CLINICAL DATA:  57 year old male with a history of heel ulcer for 2 weeks EXAM: NONINVASIVE PHYSIOLOGIC VASCULAR STUDY OF BILATERAL LOWER EXTREMITIES TECHNIQUE: Evaluation of both lower extremities was performed at rest, including calculation of ankle-brachial indices, multiple segmental pressure evaluation, segmental Doppler and segmental pulse volume recording. COMPARISON:  None. FINDINGS: Right ABI:  1.26 Left ABI:  1.14 Right Lower Extremity: Segmental Doppler of the right lower extremity demonstrates triphasic waveform at the ankle. Left Lower Extremity:  Segmental Doppler of the left lower extremity demonstrates biphasic waveform of the posterior tibial artery and triphasic dorsalis pedis. 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 Signed, Dulcy Fanny. Dellia Nims, RPVI [facility] Electronically Signed   By: Corrie Mckusick D.O.   On: 03/13/2018 11:32   US Abdomen Limited Ruq  Result Date: 03/13/2018 CLINICAL DATA:  Transaminitis.  Cholecystectomy. EXAM: ULTRASOUND ABDOMEN LIMITED RIGHT UPPER QUADRANT COMPARISON:  None. FINDINGS: Gallbladder: Removed in 1996. Common bile duct: Diameter: 10 mm, at the upper limits of normal for post cholecystectomy patient. Liver: No focal lesion identified. Within normal limits in parenchymal echogenicity. Portal vein is patent on color Doppler imaging with normal direction of blood flow towards the liver. IMPRESSION: Normal post cholecystectomy exam. Electronically Signed   By: Lorriane Shire M.D.   On: 03/13/2018 11:46    Orson Eva, DO  [facility] Pager [phone]  If 7PM-7AM,  please contact night-coverage [email or web address] Password [email or web address] 03/14/2018, 5:23 PM   LOS: 0 days

## 2018-03-15 DIAGNOSIS — R740 Nonspecific elevation of levels of transaminase and lactic acid dehydrogenase [LDH]: Secondary | ICD-10-CM | POA: Diagnosis not present

## 2018-03-15 DIAGNOSIS — E1165 Type 2 diabetes mellitus with hyperglycemia: Secondary | ICD-10-CM | POA: Diagnosis not present

## 2018-03-15 DIAGNOSIS — F3130 Bipolar disorder, current episode depressed, mild or moderate severity, unspecified: Secondary | ICD-10-CM | POA: Diagnosis not present

## 2018-03-15 DIAGNOSIS — L97401 Non-pressure chronic ulcer of unspecified heel and midfoot limited to breakdown of skin: Secondary | ICD-10-CM | POA: Diagnosis not present

## 2018-03-15 DIAGNOSIS — E08621 Diabetes mellitus due to underlying condition with foot ulcer: Secondary | ICD-10-CM | POA: Diagnosis not present

## 2018-03-15 DIAGNOSIS — I10 Essential (primary) hypertension: Secondary | ICD-10-CM | POA: Diagnosis not present

## 2018-03-15 LAB — COMPREHENSIVE METABOLIC PANEL WITH GFR
ALT: 95 U/L — ABNORMAL HIGH (ref 0–44)
AST: 30 U/L (ref 15–41)
Albumin: 3.3 g/dL — ABNORMAL LOW (ref 3.5–5.0)
Alkaline Phosphatase: 118 U/L (ref 38–126)
Anion gap: 7 (ref 5–15)
BUN: 19 mg/dL (ref 6–20)
CO2: 26 mmol/L (ref 22–32)
Calcium: 8.9 mg/dL (ref 8.9–10.3)
Chloride: 102 mmol/L (ref 98–111)
Creatinine, Ser: 1.21 mg/dL (ref 0.61–1.24)
GFR calc Af Amer: >60 mL/min
GFR calc non Af Amer: >60 mL/min
Glucose, Bld: 230 mg/dL — ABNORMAL HIGH (ref 70–99)
Potassium: 3.7 mmol/L (ref 3.5–5.1)
Sodium: 135 mmol/L (ref 135–145)
Total Bilirubin: 0.9 mg/dL (ref 0.3–1.2)
Total Protein: 7 g/dL (ref 6.5–8.1)

## 2018-03-15 LAB — GLUCOSE, CAPILLARY
GLUCOSE-CAPILLARY: 129 mg/dL — AB (ref 70–99)
Glucose-Capillary: 246 mg/dL — ABNORMAL HIGH (ref 70–99)

## 2018-03-15 MED ORDER — DOXYCYCLINE HYCLATE 100 MG PO TABS: 100.0000 mg | ORAL_TABLET | Freq: Two times a day (BID) | ORAL | 0 refills | Status: DC

## 2018-03-15 MED ORDER — CEPHALEXIN 500 MG PO CAPS
500.0000 mg | ORAL_CAPSULE | Freq: Three times a day (TID) | ORAL | Status: DC
  Administered 2018-03-15: 500 mg via ORAL
  Filled 2018-03-15: qty 1

## 2018-03-15 MED ORDER — HYDROCODONE-ACETAMINOPHEN 5-325 MG PO TABS: 1.0000 | ORAL_TABLET | Freq: Four times a day (QID) | ORAL | 0 refills | Status: DC | PRN

## 2018-03-15 MED ORDER — DOXYCYCLINE HYCLATE 100 MG PO TABS
100.0000 mg | ORAL_TABLET | Freq: Two times a day (BID) | ORAL | Status: DC
  Administered 2018-03-15: 100 mg via ORAL
  Filled 2018-03-15: qty 1

## 2018-03-15 MED ORDER — AMLODIPINE BESYLATE 5 MG PO TABS: 5.0000 mg | ORAL_TABLET | Freq: Every day | ORAL | 0 refills | Status: DC

## 2018-03-15 MED ORDER — INSULIN GLARGINE 100 UNIT/ML ~~LOC~~ SOLN
30.0000 [IU] | Freq: Every day | SUBCUTANEOUS | Status: DC
  Administered 2018-03-15: 30 [IU] via SUBCUTANEOUS
  Filled 2018-03-15 (×2): qty 0.3

## 2018-03-15 MED ORDER — ZOLPIDEM TARTRATE 5 MG PO TABS
5.0000 mg | ORAL_TABLET | Freq: Every evening | ORAL | Status: DC | PRN
  Administered 2018-03-15: 5 mg via ORAL
  Filled 2018-03-15: qty 1

## 2018-03-15 MED ORDER — CEPHALEXIN 500 MG PO CAPS: 500.0000 mg | ORAL_CAPSULE | Freq: Three times a day (TID) | ORAL | 0 refills | Status: DC

## 2018-03-15 MED ORDER — AMLODIPINE BESYLATE 5 MG PO TABS
5.0000 mg | ORAL_TABLET | Freq: Every day | ORAL | Status: DC
  Administered 2018-03-15: 5 mg via ORAL
  Filled 2018-03-15: qty 1

## 2018-03-15 NOTE — Discharge Summary (Signed)
Physician Discharge Summary  [name] RXV:[id number] DOB: 1960/10/13 DOA: 03/12/2018  PCP: Patient, No Pcp Per  Admit date: 03/12/2018 Discharge date: 03/15/2018  Admitted From: Group Home Disposition:  Group Home   Recommendations for Outpatient Follow-up:  1. Follow up with PCP in 1-2 weeks 2. Please obtain BMP/CBC in one week   Discharge Condition: Stable CODE STATUS: FULL Diet recommendation: Heart Healthy / Carb Modified   Brief/Interim Summary: 57 year old male with a history of bipolar disorder, diabetes mellitus, hypertension, migraine headache, systolic and diastolic CHF presented from his group home with 2-week history of left heel ulcer that was increasing in size, pain, and drainage. The patient went to the [facility] in [place], but he was not able to be seen and was sent to urgent care center 2 days prior to this admission. He was subsequently advised to go to the emergency department for further evaluation. The patient stated that his left heel wound began as a blister which he feels may been partly due to ill fitting shoes. He denies going barefoot. He denies any other recent injuries. He denies any fevers, chills, chest pain, shortness breath,cough, hemoptysis. He has had some abdominal pain with nausea and vomiting to 3 days prior to this admission, but this has resolved. Upon presentation, the patient was afebrile hemodynamically stable with WBC 8.0 and sedimentation rate 30. X-rays of the left ankle did not show any erosions. Because of concerns for infection and his diabetic foot ulcer, the patient was admitted for IV antibiotics and further work-up.  Discharge Diagnoses:  Infected diabetic foot ulcer/cellulitis of the foot -Discontinue levofloxacin -pt failed outpt minocycline -Continue vancomycin during hospitalization -Check CRP--1.8 -03/13/18--MRI left foot--no osteomyelitis or abscess -Wound care consult-->Cleaned wound with sterile water.  Applied hydrogel to wound on left heel. Covered with dry guaze and covered with kerlex. On right foot I painted intact ulceration with betadine and cover with foam dressing -check ABI-->normal bilateral -d/c with doxycycline and cephalexin x 7 more days  Diabetes mellitus type 2, uncontrolled with hyperglycemia -Increase Lantus to 30 units -03/14/18 Hemoglobin A1c--9.7 -NovoLog sliding scale -novolog 3 units with meals -pt states he no longer takes 70/30 -d/c back to group home with prior dose lantus 35 units  Transaminasemia -Hepatitis B surface antigen--neg -Hepatitis C antibody--neg -Right upper quadrantUS--neg -Alcohol level--neg -Urine drug screen-- -A.m. LFTs--improving -Suspect drug-induced -Discontinue Haldol and statin -recheck LFTs in one week.  If normal, can restart pravastatin 40 mg daily  Essential hypertension -Continueprazosin,losartan and metoprolol tartrate -amlodinpine added   Bipolar disorder -Continue Latuda  Hyperlipidemia -Discontinue statin due to elevated LFTs  Exocrine pancreatic insufficiency -Continue Creon     Discharge Instructions   Allergies as of 03/15/2018      Reactions   Meloxicam Other (See Comments)   Acid Reflux    Venlafaxine Other (See Comments)   Acid Reflux   Penicillins Rash      Medication List    STOP taking these medications   haloperidol 5 MG tablet Commonly known as:  HALDOL   insulin aspart protamine- aspart (70-30) 100 UNIT/ML injection Commonly known as:  NOVOLOG MIX 70/30   pravastatin 40 MG tablet Commonly known as:  PRAVACHOL     TAKE these medications   amLODipine 5 MG tablet Commonly known as:  NORVASC Take 1 tablet (5 mg total) by mouth daily.   cephALEXin 500 MG capsule Commonly known as:  KEFLEX Take 1 capsule (500 mg total) by mouth every 8 (eight) hours.   CREON  36000 UNITS Cpep capsule Generic drug:  lipase/protease/amylase Take 36,000 Units by mouth 3 (three) times  daily with meals.   doxycycline 100 MG tablet Commonly known as:  VIBRA-TABS Take 1 tablet (100 mg total) by mouth every 12 (twelve) hours.   DULoxetine 60 MG capsule Commonly known as:  CYMBALTA Take 60 mg by mouth daily.   HYDROcodone-acetaminophen 5-325 MG tablet Commonly known as:  NORCO/VICODIN Take 1 tablet by mouth every 6 (six) hours as needed for moderate pain.   hydrOXYzine 50 MG tablet Commonly known as:  ATARAX/VISTARIL Take 50 mg by mouth 2 (two) times daily as needed for anxiety.   insulin aspart 100 UNIT/ML injection Commonly known as:  novoLOG Inject 16 Units into the skin 3 (three) times daily.   insulin glargine 100 UNIT/ML injection Commonly known as:  LANTUS Inject 35 Units into the skin daily before breakfast.   losartan 100 MG tablet Commonly known as:  COZAAR Take 100 mg by mouth daily.   lurasidone 40 MG Tabs tablet Commonly known as:  LATUDA Take 40 mg by mouth daily with supper.   metoprolol tartrate 50 MG tablet Commonly known as:  LOPRESSOR Take 25 mg by mouth 2 (two) times daily.   minocycline 100 MG capsule Commonly known as:  MINOCIN,DYNACIN Take 100 mg by mouth 2 (two) times daily. 10 day course   ondansetron 8 MG tablet Commonly known as:  ZOFRAN Take 4 mg by mouth 3 (three) times daily.   pantoprazole 40 MG tablet Commonly known as:  PROTONIX Take 40 mg by mouth daily before breakfast.   prazosin 2 MG capsule Commonly known as:  MINIPRESS Take 2 mg by mouth at bedtime.   pregabalin 200 MG capsule Commonly known as:  LYRICA Take 200 mg by mouth 2 (two) times daily.       Allergies  Allergen Reactions  . Meloxicam Other (See Comments)    Acid Reflux   . Venlafaxine Other (See Comments)    Acid Reflux  . Penicillins Rash    Consultations:  none   Procedures/Studies: Dg Ankle Complete Left  Result Date: 03/12/2018 CLINICAL DATA:  Ulcer to back of heel EXAM: LEFT ANKLE COMPLETE - 3+ VIEW COMPARISON:   12/07/2014 FINDINGS: Posterior and plantar calcaneal spurs. No radiographic changes of osteomyelitis. No fracture, subluxation or dislocation. Mild degenerative changes in the left ankle. IMPRESSION: No acute bony abnormality. Electronically Signed   By: Rolm Baptise M.D.   On: 03/12/2018 21:28   [name]  Result Date: 03/13/2018 CLINICAL DATA:  Soft tissue ulceration of the left heel.  Heel pain. EXAM: MRI OF LOWER LEFT EXTREMITY WITHOUT AND WITH CONTRAST TECHNIQUE: Multiplanar, multisequence MR imaging of the left hindfoot was performed both before and after administration of intravenous contrast. CONTRAST:  9 cc Gadavist COMPARISON:  Radiographs dated 03/12/2018 FINDINGS: Bones/Joint/Cartilage There is no evidence of osteomyelitis or other acute bone abnormality. There are focal arthritic changes of the superolateral aspect of the dome of the talus with subcortical cyst formation and focal subcortical edema, probably due to overlying cartilage loss. Ligaments The ligaments of the medial and lateral aspects the ankle are intact. Muscles and Tendons The tendons around the ankle are intact. There are minimal chronic degenerative changes of the distal Achilles tendon. Soft tissues There is a shallow 2.7 cm diameter soft tissue ulceration adjacent to the posteromedial aspect of the distal Achilles tendon. There is no underlying abscess. After contrast administration there is enhancement of the  soft tissues deep to the ulceration without an abscess. There is also an adjacent area of devitalized subcutaneous fat measuring 4.6 x 4.2 x 1.6 cm at the posterior aspect of the heel. This devitalized tissue is best seen on image 29 of series 9 and image 2 of series 10. IMPRESSION: 1. No evidence of osteomyelitis of the left calcaneus. 2. Soft tissue ulceration at the posterior aspect of the left heel without underlying abscess. Adjacent cellulitis. 3. Large area of devitalized subcutaneous fat adjacent  to the ulceration. Electronically Signed   By: Lorriane Shire M.D.   On: 03/13/2018 11:27   US Arterial Abi (screening Lower Extremity)  Result Date: 03/13/2018 CLINICAL DATA:  57 year old male with a history of heel ulcer for 2 weeks EXAM: NONINVASIVE PHYSIOLOGIC VASCULAR STUDY OF BILATERAL LOWER EXTREMITIES TECHNIQUE: Evaluation of both lower extremities was performed at rest, including calculation of ankle-brachial indices, multiple segmental pressure evaluation, segmental Doppler and segmental pulse volume recording. COMPARISON:  None. FINDINGS: Right ABI:  1.26 Left ABI:  1.14 Right Lower Extremity: Segmental Doppler of the right lower extremity demonstrates triphasic waveform at the ankle. Left Lower Extremity: Segmental Doppler of the left lower extremity demonstrates biphasic waveform of the posterior tibial artery and triphasic dorsalis pedis. 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 Signed, Dulcy Fanny. Dellia Nims, RPVI [facility] Radiology Electronically Signed   By: Corrie Mckusick D.O.   On: 03/13/2018 11:32   US Abdomen Limited Ruq  Result Date: 03/13/2018 CLINICAL DATA:  Transaminitis.  Cholecystectomy. EXAM: ULTRASOUND ABDOMEN LIMITED RIGHT UPPER QUADRANT COMPARISON:  None. FINDINGS: Gallbladder: Removed in 1996. Common bile duct: Diameter: 10 mm, at the upper limits of normal for post cholecystectomy patient. Liver: No focal lesion identified. Within normal limits in parenchymal echogenicity. Portal vein is patent on color Doppler imaging with normal direction of blood flow towards the liver. IMPRESSION: Normal post cholecystectomy exam. Electronically Signed   By: Lorriane Shire M.D.   On: 03/13/2018 11:46        Discharge Exam: Vitals:   03/15/18 0158 03/15/18 0655    BP: (!) 152/81 (!) 169/80  Pulse: (!) 56 (!) 52  Resp:  18  Temp:  98.6 F (37 C)  SpO2: 93% (!) 88%   Vitals:   03/14/18 2118 03/15/18 0158 03/15/18 0655 03/15/18 0707  BP: (!) 186/83 (!) 152/81 (!) 169/80   Pulse: (!) 55 (!) 56 (!) 52   Resp: 18  18   Temp: 97.8 F (36.6 C)  98.6 F (37 C)   TempSrc: Oral  Oral   SpO2: 96% 93% (!) 88%   Weight:    88.2 kg  Height:        General: Pt is alert, awake, not in acute distress Cardiovascular: RRR, S1/S2 +, no rubs, no gallops Respiratory: CTA bilaterally, no wheezing, no rhonchi Abdominal: Soft, NT, ND, bowel sounds + Extremities: no edema, no cyanosis--see picture of left foot below      The results of significant diagnostics from this hospitalization (including imaging, microbiology, ancillary and laboratory) are listed below for reference.    Significant Diagnostic Studies: Dg Ankle Complete Left  Result Date: 03/12/2018 CLINICAL DATA:  Ulcer to back of heel EXAM: LEFT ANKLE COMPLETE - 3+ VIEW COMPARISON:  12/07/2014 FINDINGS: Posterior and plantar calcaneal spurs. No radiographic changes of osteomyelitis. No fracture, subluxation or dislocation. Mild  degenerative changes in the left ankle. IMPRESSION: No acute bony abnormality. Electronically Signed   By: Rolm Baptise M.D.   On: 03/12/2018 21:28   [name]  Result Date: 03/13/2018 CLINICAL DATA:  Soft tissue ulceration of the left heel.  Heel pain. EXAM: MRI OF LOWER LEFT EXTREMITY WITHOUT AND WITH CONTRAST TECHNIQUE: Multiplanar, multisequence MR imaging of the left hindfoot was performed both before and after administration of intravenous contrast. CONTRAST:  9 cc Gadavist COMPARISON:  Radiographs dated 03/12/2018 FINDINGS: Bones/Joint/Cartilage There is no evidence of osteomyelitis or other acute bone abnormality. There are focal arthritic changes of the superolateral aspect of the dome of the talus with subcortical cyst formation and focal subcortical  edema, probably due to overlying cartilage loss. Ligaments The ligaments of the medial and lateral aspects the ankle are intact. Muscles and Tendons The tendons around the ankle are intact. There are minimal chronic degenerative changes of the distal Achilles tendon. Soft tissues There is a shallow 2.7 cm diameter soft tissue ulceration adjacent to the posteromedial aspect of the distal Achilles tendon. There is no underlying abscess. After contrast administration there is enhancement of the soft tissues deep to the ulceration without an abscess. There is also an adjacent area of devitalized subcutaneous fat measuring 4.6 x 4.2 x 1.6 cm at the posterior aspect of the heel. This devitalized tissue is best seen on image 29 of series 9 and image 2 of series 10. IMPRESSION: 1. No evidence of osteomyelitis of the left calcaneus. 2. Soft tissue ulceration at the posterior aspect of the left heel without underlying abscess. Adjacent cellulitis. 3. Large area of devitalized subcutaneous fat adjacent to the ulceration. Electronically Signed   By: Lorriane Shire M.D.   On: 03/13/2018 11:27   US Arterial Abi (screening Lower Extremity)  Result Date: 03/13/2018 CLINICAL DATA:  57 year old male with a history of heel ulcer for 2 weeks EXAM: NONINVASIVE PHYSIOLOGIC VASCULAR STUDY OF BILATERAL LOWER EXTREMITIES TECHNIQUE: Evaluation of both lower extremities was performed at rest, including calculation of ankle-brachial indices, multiple segmental pressure evaluation, segmental Doppler and segmental pulse volume recording. COMPARISON:  None. FINDINGS: Right ABI:  1.26 Left ABI:  1.14 Right Lower Extremity: Segmental Doppler of the right lower extremity demonstrates triphasic waveform at the ankle. Left Lower Extremity: Segmental Doppler of the left lower extremity demonstrates biphasic waveform of the posterior tibial artery and triphasic dorsalis pedis. 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 Signed, Dulcy Fanny. Dellia Nims, RPVI [facility] Radiology Electronically Signed   By: Corrie Mckusick D.O.   On: 03/13/2018 11:32   US Abdomen Limited Ruq  Result Date: 03/13/2018 CLINICAL DATA:  Transaminitis.  Cholecystectomy. EXAM: ULTRASOUND ABDOMEN LIMITED RIGHT UPPER QUADRANT COMPARISON:  None. FINDINGS: Gallbladder: Removed in 1996. Common bile duct: Diameter: 10 mm, at the upper limits of normal for post cholecystectomy patient. Liver: No focal lesion identified. Within normal limits in parenchymal echogenicity. Portal vein is patent on color Doppler imaging with normal direction of blood flow towards the liver. IMPRESSION: Normal post cholecystectomy exam. Electronically Signed   By: Lorriane Shire M.D.   On: 03/13/2018 11:46     Microbiology: Recent Results (from the past 240 hour(s))  Culture, blood (Routine X 2) w Reflex to ID Panel     Status: None (Preliminary result)   Collection Time: 03/12/18 10:23  PM  Result Value Ref Range Status   Specimen Description BLOOD LEFT ANTECUBITAL  Final   Special Requests   Final    BOTTLES DRAWN AEROBIC AND ANAEROBIC Blood Culture adequate volume   Culture   Final    NO GROWTH 3 DAYS Performed at [facility], [address] [place], [place] [address]    Report Status PENDING  Incomplete  Culture, blood (Routine X 2) w Reflex to ID Panel     Status: None (Preliminary result)   Collection Time: 03/12/18 10:28 PM  Result Value Ref Range Status   Specimen Description BLOOD LEFT ANTECUBITAL  Final   Special Requests   Final    BOTTLES DRAWN AEROBIC AND ANAEROBIC Blood Culture adequate volume   Culture   Final    NO GROWTH 3 DAYS Performed at [facility], [address], [place], [place] [address]    Report Status PENDING  Incomplete  MRSA PCR Screening      Status: None   Collection Time: 03/13/18 12:32 AM  Result Value Ref Range Status   MRSA by PCR NEGATIVE NEGATIVE Final    Comment:        The GeneXpert MRSA Assay (FDA approved for NASAL specimens only), is one component of a comprehensive MRSA colonization surveillance program. It is not intended to diagnose MRSA infection nor to guide or monitor treatment for MRSA infections. Performed at [facility], [address], [place],  [address]      Labs: Basic Metabolic Panel: Recent Labs  Lab 03/12/18 2045 03/13/18 0541 03/14/18 0545 03/15/18 0707  NA 133* 137 137 135  K 4.1 3.8 4.1 3.7  CL 101 102 102 102  CO2 26 28 27 26   GLUCOSE 279* 210* 212* 230*  BUN 18 18 20 19   CREATININE 0.99 1.05 1.29* 1.21  CALCIUM 9.1 8.9 9.0 8.9   Liver Function Tests: Recent Labs  Lab 03/13/18 0541 03/14/18 0545 03/15/18 0707  AST 247* 118* 30  ALT 133* 173* 95*  ALKPHOS 142* 147* 118  BILITOT 1.6* 1.4* 0.9  PROT 7.1 7.2 7.0  ALBUMIN 3.3* 3.4* 3.3*   No results for input(s): LIPASE, AMYLASE in the last 168 hours. No results for input(s): AMMONIA in the last 168 hours. CBC: Recent Labs  Lab 03/12/18 2045 03/13/18 0541  WBC 8.0 7.6  NEUTROABS 4.4  --   HGB 14.3 13.8  HCT 45.9 43.7  MCV 84.7 86.7  PLT 313 300   Cardiac Enzymes: No results for input(s): CKTOTAL, CKMB, CKMBINDEX, TROPONINI in the last 168 hours. BNP: Invalid input(s): POCBNP CBG: Recent Labs  Lab 03/14/18 0754 03/14/18 1132 03/14/18 1615 03/14/18 2119 03/15/18 0731  GLUCAP 214* 295* 180* 141* 246*    Time coordinating discharge:  36 minutes  Signed:  Orson Eva, DO [facility] Pager: [phone] 03/15/2018, 9:23 AM

## 2018-03-15 NOTE — NC FL2 (Signed)
[place] LEVEL OF CARE SCREENING TOOL     IDENTIFICATION  Patient Name: [name] Birthdate: September 06, 1960 Sex: male Admission Date (Current Location): 03/12/2018  [facility] [place] Number:  [employer] and Address:  [place] [address], [place]      Provider Number: [id number]  Attending Physician Name and Address:  Orson Eva, MD  Relative Name and Phone Number:       Current Level of Care: Hospital Recommended Level of Care: [place] Prior Approval Number:    Date Approved/Denied:   PASRR Number:    Discharge Plan: Other (Comment)([facility])    Current Diagnoses: Patient Active Problem List   Diagnosis Date Noted  . Uncontrolled type 2 diabetes mellitus with hyperglycemia ([place]) 03/13/2018  . Diabetic foot ulcer ([place]) 03/13/2018  . Cellulitis in diabetic foot ([place]) 03/13/2018  . Transaminasemia   . Heel ulcer ([place]) 03/12/2018  . Chest pain 10/05/2017  . Bipolar disorder ([place])   . Fever 12/04/2014  . Cellulitis of left foot 12/04/2014  . Diabetes mellitus with neuropathy ([place]) 12/04/2014  . Hypertension 12/04/2014    Orientation RESPIRATION BLADDER Height & Weight     Self, Time, Situation, Place  Normal Continent Weight: 194 lb 7.1 oz (88.2 kg) Height:  5\' 10"  (177.8 cm)  BEHAVIORAL SYMPTOMS/MOOD NEUROLOGICAL BOWEL NUTRITION STATUS  (None)   Continent Diet(Heart healthy)  AMBULATORY STATUS COMMUNICATION OF NEEDS Skin   Independent Verbally Other (Comment)(diabetic foot ulcer)                       Personal Care Assistance Level of Assistance  Bathing, Feeding, Dressing Bathing Assistance: Independent Feeding assistance: Independent Dressing Assistance: Independent     Functional Limitations Info  Sight, Hearing, Speech Sight Info: Adequate Hearing Info: Adequate Speech Info: Adequate    SPECIAL CARE FACTORS FREQUENCY                       Contractures Contractures  Info: Not present    Additional Factors Info  Code Status, Allergies Code Status Info: Full Code Allergies Info: Meloxicam, Venlafaxine, Penicillens           Current Medications (03/15/2018):  Discharge Medications: Please see discharge summary for a list of discharge medications. amLODipine 5 MG tablet Commonly known as:  NORVASC Take 1 tablet (5 mg total) by mouth daily.   cephALEXin 500 MG capsule Commonly known as:  KEFLEX Take 1 capsule (500 mg total) by mouth every 8 (eight) hours.   CREON 36000 UNITS Cpep capsule Generic drug:  lipase/protease/amylase Take 36,000 Units by mouth 3 (three) times daily with meals.   doxycycline 100 MG tablet Commonly known as:  VIBRA-TABS Take 1 tablet (100 mg total) by mouth every 12 (twelve) hours.   DULoxetine 60 MG capsule Commonly known as:  CYMBALTA Take 60 mg by mouth daily.   HYDROcodone-acetaminophen 5-325 MG tablet Commonly known as:  NORCO/VICODIN Take 1 tablet by mouth every 6 (six) hours as needed for moderate pain.   hydrOXYzine 50 MG tablet Commonly known as:  ATARAX/VISTARIL Take 50 mg by mouth 2 (two) times daily as needed for anxiety.   insulin aspart 100 UNIT/ML injection Commonly known as:  novoLOG Inject 16 Units into the skin 3 (three) times daily.   insulin glargine 100 UNIT/ML injection Commonly known as:  LANTUS Inject 35 Units into the skin daily before breakfast.   losartan 100  MG tablet Commonly known as:  COZAAR Take 100 mg by mouth daily.   lurasidone 40 MG Tabs tablet Commonly known as:  LATUDA Take 40 mg by mouth daily with supper.   metoprolol tartrate 50 MG tablet Commonly known as:  LOPRESSOR Take 25 mg by mouth 2 (two) times daily.   minocycline 100 MG capsule Commonly known as:  MINOCIN,DYNACIN Take 100 mg by mouth 2 (two) times daily. 10 day course   ondansetron 8 MG tablet Commonly known as:  ZOFRAN Take 4 mg by mouth 3 (three) times daily.   pantoprazole 40  MG tablet Commonly known as:  PROTONIX Take 40 mg by mouth daily before breakfast.   prazosin 2 MG capsule Commonly known as:  MINIPRESS Take 2 mg by mouth at bedtime.   pregabalin 200 MG capsule Commonly known as:  LYRICA Take 200 mg by mouth 2 (two) times daily.       Relevant Imaging Results:  Relevant Lab Results:   Additional Information [id number]  Wende Neighbors, LCSW

## 2018-03-15 NOTE — Progress Notes (Signed)
CSW following for discharge needs. Patient information was faxed to [facility] (fax number [phone]) per her request.  Rhea Pink, MSW,  Latanya Presser [phone]

## 2018-03-15 NOTE — Plan of Care (Addendum)
Pt discharged. Pt alert and oriented and his vitals are stable. Reviewed all discharge paperwork with patient including follow-up and prescriptions. Patient verbalized understanding. His iv was removed. Patient taken down via wheelchair by nursing staff.

## 2018-03-17 DIAGNOSIS — R110 Nausea: Secondary | ICD-10-CM | POA: Diagnosis not present

## 2018-03-17 DIAGNOSIS — I5032 Chronic diastolic (congestive) heart failure: Secondary | ICD-10-CM | POA: Diagnosis not present

## 2018-03-17 DIAGNOSIS — R42 Dizziness and giddiness: Secondary | ICD-10-CM | POA: Diagnosis not present

## 2018-03-17 DIAGNOSIS — I951 Orthostatic hypotension: Secondary | ICD-10-CM | POA: Diagnosis not present

## 2018-03-17 DIAGNOSIS — L97421 Non-pressure chronic ulcer of left heel and midfoot limited to breakdown of skin: Secondary | ICD-10-CM | POA: Diagnosis not present

## 2018-03-17 DIAGNOSIS — N179 Acute kidney failure, unspecified: Secondary | ICD-10-CM | POA: Diagnosis not present

## 2018-03-17 DIAGNOSIS — E162 Hypoglycemia, unspecified: Secondary | ICD-10-CM | POA: Diagnosis not present

## 2018-03-17 DIAGNOSIS — Z794 Long term (current) use of insulin: Secondary | ICD-10-CM | POA: Diagnosis not present

## 2018-03-17 DIAGNOSIS — I110 Hypertensive heart disease with heart failure: Secondary | ICD-10-CM | POA: Diagnosis not present

## 2018-03-17 DIAGNOSIS — F315 Bipolar disorder, current episode depressed, severe, with psychotic features: Secondary | ICD-10-CM | POA: Diagnosis not present

## 2018-03-17 DIAGNOSIS — R55 Syncope and collapse: Secondary | ICD-10-CM | POA: Diagnosis not present

## 2018-03-17 DIAGNOSIS — E860 Dehydration: Secondary | ICD-10-CM | POA: Diagnosis not present

## 2018-03-17 DIAGNOSIS — R531 Weakness: Secondary | ICD-10-CM | POA: Diagnosis not present

## 2018-03-17 DIAGNOSIS — E11649 Type 2 diabetes mellitus with hypoglycemia without coma: Secondary | ICD-10-CM | POA: Diagnosis not present

## 2018-03-17 DIAGNOSIS — E161 Other hypoglycemia: Secondary | ICD-10-CM | POA: Diagnosis not present

## 2018-03-17 DIAGNOSIS — R197 Diarrhea, unspecified: Secondary | ICD-10-CM | POA: Diagnosis not present

## 2018-03-17 LAB — CULTURE, BLOOD (ROUTINE X 2)
Culture: NO GROWTH
Culture: NO GROWTH
SPECIAL REQUESTS: ADEQUATE
Special Requests: ADEQUATE

## 2018-03-18 DIAGNOSIS — E1165 Type 2 diabetes mellitus with hyperglycemia: Secondary | ICD-10-CM | POA: Diagnosis present

## 2018-03-18 DIAGNOSIS — I5032 Chronic diastolic (congestive) heart failure: Secondary | ICD-10-CM | POA: Diagnosis present

## 2018-03-18 DIAGNOSIS — Z79899 Other long term (current) drug therapy: Secondary | ICD-10-CM | POA: Diagnosis not present

## 2018-03-18 DIAGNOSIS — L97421 Non-pressure chronic ulcer of left heel and midfoot limited to breakdown of skin: Secondary | ICD-10-CM | POA: Diagnosis present

## 2018-03-18 DIAGNOSIS — E1143 Type 2 diabetes mellitus with diabetic autonomic (poly)neuropathy: Secondary | ICD-10-CM | POA: Diagnosis present

## 2018-03-18 DIAGNOSIS — E11621 Type 2 diabetes mellitus with foot ulcer: Secondary | ICD-10-CM | POA: Diagnosis present

## 2018-03-18 DIAGNOSIS — Z880 Allergy status to penicillin: Secondary | ICD-10-CM | POA: Diagnosis not present

## 2018-03-18 DIAGNOSIS — Z9049 Acquired absence of other specified parts of digestive tract: Secondary | ICD-10-CM | POA: Diagnosis not present

## 2018-03-18 DIAGNOSIS — I951 Orthostatic hypotension: Secondary | ICD-10-CM | POA: Diagnosis present

## 2018-03-18 DIAGNOSIS — I110 Hypertensive heart disease with heart failure: Secondary | ICD-10-CM | POA: Diagnosis present

## 2018-03-18 DIAGNOSIS — K529 Noninfective gastroenteritis and colitis, unspecified: Secondary | ICD-10-CM | POA: Diagnosis present

## 2018-03-18 DIAGNOSIS — E11649 Type 2 diabetes mellitus with hypoglycemia without coma: Secondary | ICD-10-CM | POA: Diagnosis not present

## 2018-03-18 DIAGNOSIS — Z9889 Other specified postprocedural states: Secondary | ICD-10-CM | POA: Diagnosis not present

## 2018-03-18 DIAGNOSIS — Z794 Long term (current) use of insulin: Secondary | ICD-10-CM | POA: Diagnosis not present

## 2018-03-18 DIAGNOSIS — Z888 Allergy status to other drugs, medicaments and biological substances status: Secondary | ICD-10-CM | POA: Diagnosis not present

## 2018-03-18 DIAGNOSIS — F315 Bipolar disorder, current episode depressed, severe, with psychotic features: Secondary | ICD-10-CM | POA: Diagnosis present

## 2018-03-18 DIAGNOSIS — R55 Syncope and collapse: Secondary | ICD-10-CM | POA: Diagnosis not present

## 2018-03-18 DIAGNOSIS — N179 Acute kidney failure, unspecified: Secondary | ICD-10-CM | POA: Diagnosis present

## 2018-03-22 DIAGNOSIS — L89620 Pressure ulcer of left heel, unstageable: Secondary | ICD-10-CM | POA: Diagnosis not present

## 2018-03-22 DIAGNOSIS — L97412 Non-pressure chronic ulcer of right heel and midfoot with fat layer exposed: Secondary | ICD-10-CM | POA: Diagnosis not present

## 2018-03-22 DIAGNOSIS — Z794 Long term (current) use of insulin: Secondary | ICD-10-CM | POA: Diagnosis not present

## 2018-03-22 DIAGNOSIS — K3184 Gastroparesis: Secondary | ICD-10-CM | POA: Diagnosis not present

## 2018-03-22 DIAGNOSIS — Z79899 Other long term (current) drug therapy: Secondary | ICD-10-CM | POA: Diagnosis not present

## 2018-03-22 DIAGNOSIS — I110 Hypertensive heart disease with heart failure: Secondary | ICD-10-CM | POA: Diagnosis not present

## 2018-03-22 DIAGNOSIS — F315 Bipolar disorder, current episode depressed, severe, with psychotic features: Secondary | ICD-10-CM | POA: Diagnosis not present

## 2018-03-22 DIAGNOSIS — E1143 Type 2 diabetes mellitus with diabetic autonomic (poly)neuropathy: Secondary | ICD-10-CM | POA: Diagnosis not present

## 2018-03-22 DIAGNOSIS — Z4800 Encounter for change or removal of nonsurgical wound dressing: Secondary | ICD-10-CM | POA: Diagnosis not present

## 2018-03-22 DIAGNOSIS — F4310 Post-traumatic stress disorder, unspecified: Secondary | ICD-10-CM | POA: Diagnosis not present

## 2018-03-22 DIAGNOSIS — I5032 Chronic diastolic (congestive) heart failure: Secondary | ICD-10-CM | POA: Diagnosis not present

## 2018-03-22 DIAGNOSIS — E1142 Type 2 diabetes mellitus with diabetic polyneuropathy: Secondary | ICD-10-CM | POA: Diagnosis not present

## 2018-03-22 DIAGNOSIS — E11621 Type 2 diabetes mellitus with foot ulcer: Secondary | ICD-10-CM | POA: Diagnosis not present

## 2018-03-22 DIAGNOSIS — R55 Syncope and collapse: Secondary | ICD-10-CM | POA: Diagnosis not present

## 2018-03-23 DIAGNOSIS — E1143 Type 2 diabetes mellitus with diabetic autonomic (poly)neuropathy: Secondary | ICD-10-CM | POA: Diagnosis present

## 2018-03-23 DIAGNOSIS — E1165 Type 2 diabetes mellitus with hyperglycemia: Secondary | ICD-10-CM | POA: Diagnosis present

## 2018-03-23 DIAGNOSIS — Z87891 Personal history of nicotine dependence: Secondary | ICD-10-CM | POA: Diagnosis not present

## 2018-03-23 DIAGNOSIS — I5189 Other ill-defined heart diseases: Secondary | ICD-10-CM | POA: Diagnosis not present

## 2018-03-23 DIAGNOSIS — R52 Pain, unspecified: Secondary | ICD-10-CM | POA: Diagnosis not present

## 2018-03-23 DIAGNOSIS — E1340 Other specified diabetes mellitus with diabetic neuropathy, unspecified: Secondary | ICD-10-CM | POA: Diagnosis not present

## 2018-03-23 DIAGNOSIS — E11649 Type 2 diabetes mellitus with hypoglycemia without coma: Secondary | ICD-10-CM | POA: Diagnosis not present

## 2018-03-23 DIAGNOSIS — F315 Bipolar disorder, current episode depressed, severe, with psychotic features: Secondary | ICD-10-CM | POA: Diagnosis present

## 2018-03-23 DIAGNOSIS — F4310 Post-traumatic stress disorder, unspecified: Secondary | ICD-10-CM | POA: Diagnosis present

## 2018-03-23 DIAGNOSIS — I472 Ventricular tachycardia: Secondary | ICD-10-CM | POA: Diagnosis present

## 2018-03-23 DIAGNOSIS — I951 Orthostatic hypotension: Secondary | ICD-10-CM | POA: Diagnosis present

## 2018-03-23 DIAGNOSIS — Z794 Long term (current) use of insulin: Secondary | ICD-10-CM | POA: Diagnosis not present

## 2018-03-23 DIAGNOSIS — R51 Headache: Secondary | ICD-10-CM | POA: Diagnosis not present

## 2018-03-23 DIAGNOSIS — R55 Syncope and collapse: Secondary | ICD-10-CM | POA: Diagnosis not present

## 2018-03-23 DIAGNOSIS — E860 Dehydration: Secondary | ICD-10-CM | POA: Diagnosis present

## 2018-03-23 DIAGNOSIS — G909 Disorder of the autonomic nervous system, unspecified: Secondary | ICD-10-CM | POA: Diagnosis not present

## 2018-03-23 DIAGNOSIS — I10 Essential (primary) hypertension: Secondary | ICD-10-CM | POA: Diagnosis present

## 2018-03-23 DIAGNOSIS — E782 Mixed hyperlipidemia: Secondary | ICD-10-CM | POA: Diagnosis present

## 2018-03-23 DIAGNOSIS — Z880 Allergy status to penicillin: Secondary | ICD-10-CM | POA: Diagnosis not present

## 2018-03-23 DIAGNOSIS — Z79899 Other long term (current) drug therapy: Secondary | ICD-10-CM | POA: Diagnosis not present

## 2018-03-23 DIAGNOSIS — Z888 Allergy status to other drugs, medicaments and biological substances status: Secondary | ICD-10-CM | POA: Diagnosis not present

## 2018-03-23 DIAGNOSIS — I081 Rheumatic disorders of both mitral and tricuspid valves: Secondary | ICD-10-CM | POA: Diagnosis not present

## 2018-03-23 DIAGNOSIS — R42 Dizziness and giddiness: Secondary | ICD-10-CM | POA: Diagnosis not present

## 2018-03-23 DIAGNOSIS — S0990XA Unspecified injury of head, initial encounter: Secondary | ICD-10-CM | POA: Diagnosis not present

## 2018-03-23 DIAGNOSIS — I517 Cardiomegaly: Secondary | ICD-10-CM | POA: Diagnosis not present

## 2018-03-23 DIAGNOSIS — K219 Gastro-esophageal reflux disease without esophagitis: Secondary | ICD-10-CM | POA: Diagnosis present

## 2018-03-23 DIAGNOSIS — I440 Atrioventricular block, first degree: Secondary | ICD-10-CM | POA: Diagnosis not present

## 2018-04-05 ENCOUNTER — Emergency Department (HOSPITAL_COMMUNITY)
Admission: EM | Admit: 2018-04-05 | Discharge: 2018-04-05 | Disposition: A | Discharge status: Home / Self Care | Payer: Medicare Other | Attending: Emergency Medicine | Admitting: Emergency Medicine

## 2018-04-05 ENCOUNTER — Emergency Department (HOSPITAL_COMMUNITY): Payer: Medicare Other

## 2018-04-05 ENCOUNTER — Encounter (HOSPITAL_COMMUNITY): Payer: Self-pay | Admitting: Emergency Medicine

## 2018-04-05 ENCOUNTER — Other Ambulatory Visit: Payer: Self-pay

## 2018-04-05 DIAGNOSIS — E11621 Type 2 diabetes mellitus with foot ulcer: Secondary | ICD-10-CM | POA: Diagnosis not present

## 2018-04-05 DIAGNOSIS — Z794 Long term (current) use of insulin: Secondary | ICD-10-CM | POA: Insufficient documentation

## 2018-04-05 DIAGNOSIS — I10 Essential (primary) hypertension: Secondary | ICD-10-CM | POA: Diagnosis not present

## 2018-04-05 DIAGNOSIS — L97529 Non-pressure chronic ulcer of other part of left foot with unspecified severity: Secondary | ICD-10-CM | POA: Diagnosis not present

## 2018-04-05 DIAGNOSIS — I951 Orthostatic hypotension: Secondary | ICD-10-CM | POA: Diagnosis not present

## 2018-04-05 DIAGNOSIS — M25572 Pain in left ankle and joints of left foot: Secondary | ICD-10-CM | POA: Diagnosis not present

## 2018-04-05 DIAGNOSIS — R55 Syncope and collapse: Secondary | ICD-10-CM | POA: Diagnosis not present

## 2018-04-05 DIAGNOSIS — E119 Type 2 diabetes mellitus without complications: Secondary | ICD-10-CM | POA: Diagnosis not present

## 2018-04-05 DIAGNOSIS — Z79899 Other long term (current) drug therapy: Secondary | ICD-10-CM | POA: Diagnosis not present

## 2018-04-05 LAB — CBC
HCT: 45.7 % (ref 39.0–52.0)
Hemoglobin: 14.4 g/dL (ref 13.0–17.0)
MCH: 28 pg (ref 26.0–34.0)
MCHC: 31.5 g/dL (ref 30.0–36.0)
MCV: 88.9 fL (ref 80.0–100.0)
PLATELETS: 216 10*3/uL (ref 150–400)
RBC: 5.14 MIL/uL (ref 4.22–5.81)
RDW: 13.3 % (ref 11.5–15.5)
WBC: 8.8 10*3/uL (ref 4.0–10.5)
nRBC: 0 % (ref 0.0–0.2)

## 2018-04-05 LAB — URINALYSIS, ROUTINE W REFLEX MICROSCOPIC
BILIRUBIN URINE: NEGATIVE
Glucose, UA: 50 mg/dL — AB
Hgb urine dipstick: NEGATIVE
KETONES UR: NEGATIVE mg/dL
Leukocytes, UA: NEGATIVE
NITRITE: NEGATIVE
PH: 6 (ref 5.0–8.0)
PROTEIN: NEGATIVE mg/dL
Specific Gravity, Urine: 1.015 (ref 1.005–1.030)

## 2018-04-05 LAB — BASIC METABOLIC PANEL
Anion gap: 6 (ref 5–15)
BUN: 15 mg/dL (ref 6–20)
CO2: 26 mmol/L (ref 22–32)
Calcium: 9.1 mg/dL (ref 8.9–10.3)
Chloride: 105 mmol/L (ref 98–111)
Creatinine, Ser: 1.06 mg/dL (ref 0.61–1.24)
Glucose, Bld: 197 mg/dL — ABNORMAL HIGH (ref 70–99)
Potassium: 4.3 mmol/L (ref 3.5–5.1)
SODIUM: 137 mmol/L (ref 135–145)

## 2018-04-05 LAB — CBG MONITORING, ED: Glucose-Capillary: 179 mg/dL — ABNORMAL HIGH (ref 70–99)

## 2018-04-05 MED ORDER — ACETAMINOPHEN 500 MG PO TABS
1000.0000 mg | ORAL_TABLET | Freq: Once | ORAL | Status: AC
  Administered 2018-04-05: 1000 mg via ORAL
  Filled 2018-04-05: qty 2

## 2018-04-05 MED ORDER — SODIUM CHLORIDE 0.9 % IV BOLUS
500.0000 mL | Freq: Once | INTRAVENOUS | Status: AC
  Administered 2018-04-05: 500 mL via INTRAVENOUS

## 2018-04-05 NOTE — Discharge Instructions (Addendum)
Change positions slowly.  Make sure you are drinking plenty of fluids.  If you become lightheaded sit back down.  Follow-up closely regarding your foot ulceration.  Return immediately for fever, foul-smelling discharge coming from the wound, redness or for any concerns.

## 2018-04-05 NOTE — ED Notes (Addendum)
Number to reach : [place]

## 2018-04-05 NOTE — ED Notes (Signed)
Pt stated he felt like "he was going to pass out" while preforming ortho VS.  Pt visibly was starting to lose balance, swaying back and forth. Pt stated he felt really dizzy and light headed.   Pt assisted back into bed.

## 2018-04-05 NOTE — ED Provider Notes (Signed)
[facility] EMERGENCY DEPARTMENT Provider Note   CSN: [id number] Arrival date & time: 04/05/18  1419     History   Chief Complaint Chief Complaint  Patient presents with  . Near Syncope    HPI [name] is a 57 y.o. male.  HPI Patient states he has been diagnosed with orthostatic hypotension.  Was at his group home and attempted to stand from a sitting position.  Became lightheaded and fell to the ground.  Denies any head trauma.  Denies syncope.  He is complaining of left ankle pain after the fall.  He has a chronic left heel ulceration.  Denies headache, focal weakness or numbness. Past Medical History:  Diagnosis Date  . Anxiety   . Bipolar disorder ([place])   . Chronic pain   . Depression   . Diabetes mellitus without complication ([place])   . Diverticulitis   . GERD (gastroesophageal reflux disease)   . Hypertension   . IBS (irritable bowel syndrome)   . Kidney stone   . Migraine headache   . Neuropathy     Patient Active Problem List   Diagnosis Date Noted  . Uncontrolled type 2 diabetes mellitus with hyperglycemia ([place]) 03/13/2018  . Diabetic foot ulcer ([place]) 03/13/2018  . Cellulitis in diabetic foot ([place]) 03/13/2018  . Transaminasemia   . Heel ulcer ([place]) 03/12/2018  . Chest pain 10/05/2017  . Bipolar disorder ([place])   . Fever 12/04/2014  . Cellulitis of left foot 12/04/2014  . Diabetes mellitus with neuropathy ([place]) 12/04/2014  . Hypertension 12/04/2014    Past Surgical History:  Procedure Laterality Date  . ANKLE SURGERY Right   . CARPAL TUNNEL RELEASE    . CHOLECYSTECTOMY    . COLONOSCOPY          Home Medications    Prior to Admission medications   Medication Sig Start Date End Date Taking? Authorizing Provider  amLODipine (NORVASC) 5 MG tablet Take 1 tablet (5 mg total) by mouth daily. 03/15/18  Yes Tat, Shanon Brow, MD  DULoxetine (CYMBALTA) 60 MG capsule Take 60 mg by mouth daily.  08/04/17  Yes [provider]    HYDROcodone-acetaminophen (NORCO) 5-325 MG tablet Take 1 tablet by mouth every 6 (six) hours as needed for moderate pain. 03/15/18  Yes Tat, Shanon Brow, MD  hydrOXYzine (ATARAX/VISTARIL) 50 MG tablet Take 50 mg by mouth 2 (two) times daily as needed for anxiety.   Yes [provider]  insulin aspart (NOVOLOG) 100 UNIT/ML injection Inject 16 Units into the skin 3 (three) times daily.    Yes [provider]  insulin glargine (LANTUS) 100 UNIT/ML injection Inject 35 Units into the skin daily before breakfast.   Yes [provider]  lipase/protease/amylase (CREON) 36000 UNITS CPEP capsule Take 36,000 Units by mouth 3 (three) times daily with meals.   Yes [provider]  losartan (COZAAR) 100 MG tablet Take 100 mg by mouth daily.   Yes [provider]  lurasidone (LATUDA) 40 MG TABS tablet Take 40 mg by mouth daily with supper.    Yes [provider]  metoprolol tartrate (LOPRESSOR) 50 MG tablet Take 25 mg by mouth 2 (two) times daily.    Yes [provider]  ondansetron (ZOFRAN) 8 MG tablet Take 4 mg by mouth 3 (three) times daily.   Yes [provider]  pantoprazole (PROTONIX) 40 MG tablet Take 40 mg by mouth daily before breakfast.   Yes [provider]  prazosin (MINIPRESS) 2 MG capsule Take  2 mg by mouth at bedtime.   Yes [provider]  pregabalin (LYRICA) 200 MG capsule Take 200 mg by mouth 2 (two) times daily.    Yes [provider]  cephALEXin (KEFLEX) 500 MG capsule Take 1 capsule (500 mg total) by mouth every 8 (eight) hours. 03/15/18   Orson Eva, MD  doxycycline (VIBRA-TABS) 100 MG tablet Take 1 tablet (100 mg total) by mouth every 12 (twelve) hours. 03/15/18   Orson Eva, MD  minocycline (MINOCIN,DYNACIN) 100 MG capsule Take 100 mg by mouth 2 (two) times daily. 10 day course    [provider]    Family History Family History  Problem Relation Age of Onset  . CAD Mother   . CAD  Brother     Social History Social History   Tobacco Use  . Smoking status: Never Smoker  . Smokeless tobacco: Never Used  Substance Use Topics  . Alcohol use: No  . Drug use: Not Currently     Allergies   Meloxicam; Venlafaxine; and Penicillins   Review of Systems Review of Systems  Constitutional: Negative for chills and fever.  HENT: Negative for trouble swallowing.   Eyes: Negative for visual disturbance.  Respiratory: Negative for cough and shortness of breath.   Cardiovascular: Negative for chest pain and leg swelling.  Gastrointestinal: Negative for abdominal pain, diarrhea, nausea and vomiting.  Genitourinary: Negative for dysuria, flank pain and frequency.  Musculoskeletal: Positive for arthralgias. Negative for back pain, myalgias and neck pain.  Skin: Positive for wound. Negative for rash.  Neurological: Positive for light-headedness. Negative for weakness, numbness and headaches.  All other systems reviewed and are negative.    Physical Exam Updated Vital Signs BP (!) 165/106   Pulse 95   Temp 98.1 F (36.7 C) (Oral)   Resp 13   Ht 5\' 10"  (1.778 m)   Wt 88.2 kg   SpO2 97%   BMI 27.90 kg/m   Physical Exam  Constitutional: He is oriented to person, place, and time. He appears well-developed and well-nourished. No distress.  HENT:  Head: Normocephalic and atraumatic.  Mouth/Throat: Oropharynx is clear and moist. No oropharyngeal exudate.  No evidence of any head trauma.  Eyes: Pupils are equal, round, and reactive to light. EOM are normal.  No nystagmus  Neck: Normal range of motion. Neck supple.  No posterior midline cervical tenderness to palpation.  Cardiovascular: Normal rate and regular rhythm.  Pulmonary/Chest: Effort normal and breath sounds normal.  Abdominal: Soft. Bowel sounds are normal. There is no tenderness. There is no rebound and no guarding.  Musculoskeletal: Normal range of motion. He exhibits tenderness. He exhibits no edema.    Mild tenderness to palpation just proximal to the left ankle.  No obvious deformity, effusion or injury.  She does have roughly 3 centimeter ulceration to the left posterior heel.  Evidence of previous bleeding but no active bleeding.  No surrounding erythema or warmth.  Neurological: He is alert and oriented to person, place, and time.  Moving all extremities without focal deficit.  Sensation intact.  Skin: Skin is warm and dry. No rash noted. He is not diaphoretic. No erythema.  Psychiatric: He has a normal mood and affect. His behavior is normal.  Nursing note and vitals reviewed.    ED Treatments / Results  Labs (all labs ordered are listed, but only abnormal results are displayed) Labs Reviewed  BASIC METABOLIC PANEL - Abnormal; Notable for the following components:      Result  Value   Glucose, Bld 197 (*)    All other components within normal limits  URINALYSIS, ROUTINE W REFLEX MICROSCOPIC - Abnormal; Notable for the following components:   Glucose, UA 50 (*)    All other components within normal limits  CBG MONITORING, ED - Abnormal; Notable for the following components:   Glucose-Capillary 179 (*)    All other components within normal limits  CBC    EKG EKG Interpretation  Date/Time:  Sunday April 05 2018 14:32:59 EST Ventricular Rate:  90 PR Interval:    QRS Duration: 86 QT Interval:  387 QTC Calculation: 471 R Axis:   -37 Text Interpretation:  Sinus rhythm Borderline prolonged PR interval Inferior infarct, old Consider anterior infarct Confirmed by Julianne Rice [phone]) on 04/05/2018 3:20:16 PM   Radiology Dg Ankle Complete Left  Result Date: 04/05/2018 CLINICAL DATA:  Left ankle pain.SORE ON INTERIOR SIDE OF LEFT ANKLE PATIENT STATES " DIABETIC PLACE HAS BEEN THERE X 1 MONTH " EXAM: LEFT ANKLE COMPLETE - 3+ VIEW COMPARISON:  03/12/2018 FINDINGS: No evidence for an acute fracture. No subluxation or dislocation. Degenerative changes are seen in the ankle  mortise. No worrisome lytic or sclerotic osseous abnormality. IMPRESSION: Negative. Electronically Signed   By: Misty Stanley M.D.   On: 04/05/2018 15:59    Procedures Procedures (including critical care time)  Medications Ordered in ED Medications  acetaminophen (TYLENOL) tablet 1,000 mg (1,000 mg Oral Given 04/05/18 1648)  sodium chloride 0.9 % bolus 500 mL (500 mLs Intravenous New Bag/Given 04/05/18 1648)     Initial Impression / Assessment and Plan / ED Course  I have reviewed the triage vital signs and the nursing notes.  Pertinent labs & imaging results that were available during my care of the patient were reviewed by me and considered in my medical decision making (see chart for details).    Patient became mildly orthostatic with position change.  Given IV fluids.  We will ulceration appears to be chronic with no evidence of secondary infection.  Normal white blood cell count.  Advise slow position changes and making sure he is drinking plenty of fluids.  Return precautions given.   Final Clinical Impressions(s) / ED Diagnoses   Final diagnoses:  Orthostasis  Ulcer of foot, chronic, left, with unspecified severity [facility])    ED Discharge Orders    None       Julianne Rice, MD 04/05/18 [phone]

## 2018-04-05 NOTE — ED Triage Notes (Signed)
Pt sent from [facility] for a fall and near syncope. Per staff pt has had two falls in last 24 hours. Was seen for the same last month in [place] and dx with orthostatic hypotension. CBG 236.

## 2018-04-09 DIAGNOSIS — L89620 Pressure ulcer of left heel, unstageable: Secondary | ICD-10-CM | POA: Diagnosis not present

## 2018-04-09 DIAGNOSIS — E1143 Type 2 diabetes mellitus with diabetic autonomic (poly)neuropathy: Secondary | ICD-10-CM | POA: Diagnosis not present

## 2018-04-09 DIAGNOSIS — E1142 Type 2 diabetes mellitus with diabetic polyneuropathy: Secondary | ICD-10-CM | POA: Diagnosis not present

## 2018-04-09 DIAGNOSIS — E11621 Type 2 diabetes mellitus with foot ulcer: Secondary | ICD-10-CM | POA: Diagnosis not present

## 2018-04-09 DIAGNOSIS — L97412 Non-pressure chronic ulcer of right heel and midfoot with fat layer exposed: Secondary | ICD-10-CM | POA: Diagnosis not present

## 2018-04-09 DIAGNOSIS — K3184 Gastroparesis: Secondary | ICD-10-CM | POA: Diagnosis not present

## 2018-04-11 DIAGNOSIS — L89620 Pressure ulcer of left heel, unstageable: Secondary | ICD-10-CM | POA: Diagnosis not present

## 2018-04-11 DIAGNOSIS — E1143 Type 2 diabetes mellitus with diabetic autonomic (poly)neuropathy: Secondary | ICD-10-CM | POA: Diagnosis not present

## 2018-04-11 DIAGNOSIS — E11621 Type 2 diabetes mellitus with foot ulcer: Secondary | ICD-10-CM | POA: Diagnosis not present

## 2018-04-11 DIAGNOSIS — L97412 Non-pressure chronic ulcer of right heel and midfoot with fat layer exposed: Secondary | ICD-10-CM | POA: Diagnosis not present

## 2018-04-11 DIAGNOSIS — E1142 Type 2 diabetes mellitus with diabetic polyneuropathy: Secondary | ICD-10-CM | POA: Diagnosis not present

## 2018-04-11 DIAGNOSIS — K3184 Gastroparesis: Secondary | ICD-10-CM | POA: Diagnosis not present

## 2018-04-13 DIAGNOSIS — L89620 Pressure ulcer of left heel, unstageable: Secondary | ICD-10-CM | POA: Diagnosis not present

## 2018-04-13 DIAGNOSIS — E1142 Type 2 diabetes mellitus with diabetic polyneuropathy: Secondary | ICD-10-CM | POA: Diagnosis not present

## 2018-04-13 DIAGNOSIS — E1143 Type 2 diabetes mellitus with diabetic autonomic (poly)neuropathy: Secondary | ICD-10-CM | POA: Diagnosis not present

## 2018-04-13 DIAGNOSIS — E11621 Type 2 diabetes mellitus with foot ulcer: Secondary | ICD-10-CM | POA: Diagnosis not present

## 2018-04-13 DIAGNOSIS — L97412 Non-pressure chronic ulcer of right heel and midfoot with fat layer exposed: Secondary | ICD-10-CM | POA: Diagnosis not present

## 2018-04-13 DIAGNOSIS — K3184 Gastroparesis: Secondary | ICD-10-CM | POA: Diagnosis not present

## 2018-04-14 DIAGNOSIS — E11621 Type 2 diabetes mellitus with foot ulcer: Secondary | ICD-10-CM | POA: Diagnosis not present

## 2018-04-14 DIAGNOSIS — E1143 Type 2 diabetes mellitus with diabetic autonomic (poly)neuropathy: Secondary | ICD-10-CM | POA: Diagnosis not present

## 2018-04-14 DIAGNOSIS — L97412 Non-pressure chronic ulcer of right heel and midfoot with fat layer exposed: Secondary | ICD-10-CM | POA: Diagnosis not present

## 2018-04-14 DIAGNOSIS — E1142 Type 2 diabetes mellitus with diabetic polyneuropathy: Secondary | ICD-10-CM | POA: Diagnosis not present

## 2018-04-14 DIAGNOSIS — L89620 Pressure ulcer of left heel, unstageable: Secondary | ICD-10-CM | POA: Diagnosis not present

## 2018-04-14 DIAGNOSIS — K3184 Gastroparesis: Secondary | ICD-10-CM | POA: Diagnosis not present

## 2018-04-17 DIAGNOSIS — L97412 Non-pressure chronic ulcer of right heel and midfoot with fat layer exposed: Secondary | ICD-10-CM | POA: Diagnosis not present

## 2018-04-17 DIAGNOSIS — L89620 Pressure ulcer of left heel, unstageable: Secondary | ICD-10-CM | POA: Diagnosis not present

## 2018-04-17 DIAGNOSIS — E11621 Type 2 diabetes mellitus with foot ulcer: Secondary | ICD-10-CM | POA: Diagnosis not present

## 2018-04-17 DIAGNOSIS — E1142 Type 2 diabetes mellitus with diabetic polyneuropathy: Secondary | ICD-10-CM | POA: Diagnosis not present

## 2018-04-17 DIAGNOSIS — E1143 Type 2 diabetes mellitus with diabetic autonomic (poly)neuropathy: Secondary | ICD-10-CM | POA: Diagnosis not present

## 2018-04-17 DIAGNOSIS — K3184 Gastroparesis: Secondary | ICD-10-CM | POA: Diagnosis not present

## 2018-04-20 DIAGNOSIS — K3184 Gastroparesis: Secondary | ICD-10-CM | POA: Diagnosis not present

## 2018-04-20 DIAGNOSIS — L89620 Pressure ulcer of left heel, unstageable: Secondary | ICD-10-CM | POA: Diagnosis not present

## 2018-04-20 DIAGNOSIS — L97412 Non-pressure chronic ulcer of right heel and midfoot with fat layer exposed: Secondary | ICD-10-CM | POA: Diagnosis not present

## 2018-04-20 DIAGNOSIS — E1143 Type 2 diabetes mellitus with diabetic autonomic (poly)neuropathy: Secondary | ICD-10-CM | POA: Diagnosis not present

## 2018-04-20 DIAGNOSIS — E1142 Type 2 diabetes mellitus with diabetic polyneuropathy: Secondary | ICD-10-CM | POA: Diagnosis not present

## 2018-04-20 DIAGNOSIS — E11621 Type 2 diabetes mellitus with foot ulcer: Secondary | ICD-10-CM | POA: Diagnosis not present

## 2018-04-24 DIAGNOSIS — L97412 Non-pressure chronic ulcer of right heel and midfoot with fat layer exposed: Secondary | ICD-10-CM | POA: Diagnosis not present

## 2018-04-24 DIAGNOSIS — K3184 Gastroparesis: Secondary | ICD-10-CM | POA: Diagnosis not present

## 2018-04-24 DIAGNOSIS — E11621 Type 2 diabetes mellitus with foot ulcer: Secondary | ICD-10-CM | POA: Diagnosis not present

## 2018-04-24 DIAGNOSIS — L89620 Pressure ulcer of left heel, unstageable: Secondary | ICD-10-CM | POA: Diagnosis not present

## 2018-04-24 DIAGNOSIS — E1142 Type 2 diabetes mellitus with diabetic polyneuropathy: Secondary | ICD-10-CM | POA: Diagnosis not present

## 2018-04-24 DIAGNOSIS — E1143 Type 2 diabetes mellitus with diabetic autonomic (poly)neuropathy: Secondary | ICD-10-CM | POA: Diagnosis not present

## 2018-04-27 ENCOUNTER — Emergency Department (HOSPITAL_COMMUNITY)
Admission: EM | Admit: 2018-04-27 | Discharge: 2018-04-27 | Disposition: A | Discharge status: Home / Self Care | Payer: Medicare Other | Attending: Emergency Medicine | Admitting: Emergency Medicine

## 2018-04-27 ENCOUNTER — Encounter (HOSPITAL_COMMUNITY): Payer: Self-pay | Admitting: Emergency Medicine

## 2018-04-27 ENCOUNTER — Emergency Department (HOSPITAL_COMMUNITY): Payer: Medicare Other

## 2018-04-27 DIAGNOSIS — R197 Diarrhea, unspecified: Secondary | ICD-10-CM | POA: Diagnosis not present

## 2018-04-27 DIAGNOSIS — I10 Essential (primary) hypertension: Secondary | ICD-10-CM | POA: Insufficient documentation

## 2018-04-27 DIAGNOSIS — L89620 Pressure ulcer of left heel, unstageable: Secondary | ICD-10-CM | POA: Diagnosis not present

## 2018-04-27 DIAGNOSIS — K3184 Gastroparesis: Secondary | ICD-10-CM | POA: Diagnosis not present

## 2018-04-27 DIAGNOSIS — E1142 Type 2 diabetes mellitus with diabetic polyneuropathy: Secondary | ICD-10-CM | POA: Diagnosis not present

## 2018-04-27 DIAGNOSIS — E11621 Type 2 diabetes mellitus with foot ulcer: Secondary | ICD-10-CM | POA: Diagnosis not present

## 2018-04-27 DIAGNOSIS — E119 Type 2 diabetes mellitus without complications: Secondary | ICD-10-CM | POA: Insufficient documentation

## 2018-04-27 DIAGNOSIS — R1084 Generalized abdominal pain: Secondary | ICD-10-CM | POA: Diagnosis not present

## 2018-04-27 DIAGNOSIS — R112 Nausea with vomiting, unspecified: Secondary | ICD-10-CM | POA: Diagnosis not present

## 2018-04-27 DIAGNOSIS — N281 Cyst of kidney, acquired: Secondary | ICD-10-CM | POA: Diagnosis not present

## 2018-04-27 DIAGNOSIS — E1143 Type 2 diabetes mellitus with diabetic autonomic (poly)neuropathy: Secondary | ICD-10-CM | POA: Diagnosis not present

## 2018-04-27 DIAGNOSIS — I493 Ventricular premature depolarization: Secondary | ICD-10-CM | POA: Diagnosis not present

## 2018-04-27 DIAGNOSIS — R109 Unspecified abdominal pain: Secondary | ICD-10-CM | POA: Diagnosis not present

## 2018-04-27 DIAGNOSIS — L97412 Non-pressure chronic ulcer of right heel and midfoot with fat layer exposed: Secondary | ICD-10-CM | POA: Diagnosis not present

## 2018-04-27 LAB — CBC WITH DIFFERENTIAL/PLATELET
Abs Immature Granulocytes: 0.03 10*3/uL (ref 0.00–0.07)
BASOS ABS: 0 10*3/uL (ref 0.0–0.1)
Basophils Relative: 0 %
Eosinophils Absolute: 0 10*3/uL (ref 0.0–0.5)
Eosinophils Relative: 0 %
HCT: 44.8 % (ref 39.0–52.0)
Hemoglobin: 14.1 g/dL (ref 13.0–17.0)
IMMATURE GRANULOCYTES: 0 %
LYMPHS PCT: 19 %
Lymphs Abs: 1.5 10*3/uL (ref 0.7–4.0)
MCH: 27.5 pg (ref 26.0–34.0)
MCHC: 31.5 g/dL (ref 30.0–36.0)
MCV: 87.3 fL (ref 80.0–100.0)
Monocytes Absolute: 0.6 10*3/uL (ref 0.1–1.0)
Monocytes Relative: 7 %
NRBC: 0 % (ref 0.0–0.2)
Neutro Abs: 5.9 10*3/uL (ref 1.7–7.7)
Neutrophils Relative %: 74 %
Platelets: 311 10*3/uL (ref 150–400)
RBC: 5.13 MIL/uL (ref 4.22–5.81)
RDW: 13.9 % (ref 11.5–15.5)
WBC: 8.1 10*3/uL (ref 4.0–10.5)

## 2018-04-27 LAB — COMPREHENSIVE METABOLIC PANEL
ALBUMIN: 3.6 g/dL (ref 3.5–5.0)
ALT: 15 U/L (ref 0–44)
AST: 22 U/L (ref 15–41)
Alkaline Phosphatase: 147 U/L — ABNORMAL HIGH (ref 38–126)
Anion gap: 7 (ref 5–15)
BUN: 25 mg/dL — ABNORMAL HIGH (ref 6–20)
CO2: 23 mmol/L (ref 22–32)
Calcium: 9.3 mg/dL (ref 8.9–10.3)
Chloride: 107 mmol/L (ref 98–111)
Creatinine, Ser: 1.02 mg/dL (ref 0.61–1.24)
GFR calc Af Amer: >60 mL/min (ref >60)
GFR calc non Af Amer: >60 mL/min (ref >60)
GLUCOSE: 165 mg/dL — AB (ref 70–99)
Potassium: 4.1 mmol/L (ref 3.5–5.1)
Sodium: 137 mmol/L (ref 135–145)
Total Bilirubin: 0.8 mg/dL (ref 0.3–1.2)
Total Protein: 7.6 g/dL (ref 6.5–8.1)

## 2018-04-27 LAB — URINALYSIS, ROUTINE W REFLEX MICROSCOPIC
Bilirubin Urine: NEGATIVE
Glucose, UA: 50 mg/dL — AB
Hgb urine dipstick: NEGATIVE
Ketones, ur: 20 mg/dL — AB
Leukocytes, UA: NEGATIVE
NITRITE: NEGATIVE
Protein, ur: NEGATIVE mg/dL
Specific Gravity, Urine: 1.019 (ref 1.005–1.030)
pH: 6 (ref 5.0–8.0)

## 2018-04-27 LAB — TROPONIN I: Troponin I: <0.03 ng/mL (ref <0.03)

## 2018-04-27 LAB — LIPASE, BLOOD: Lipase: 22 U/L (ref 11–51)

## 2018-04-27 MED ORDER — PANTOPRAZOLE SODIUM 40 MG PO TBEC: 40.0000 mg | DELAYED_RELEASE_TABLET | Freq: Every day | ORAL | 0 refills | Status: AC

## 2018-04-27 MED ORDER — SUCRALFATE 1 G PO TABS: 1.0000 g | ORAL_TABLET | Freq: Three times a day (TID) | ORAL | 0 refills | Status: DC

## 2018-04-27 MED ORDER — SODIUM CHLORIDE 0.9 % IV BOLUS
1000.0000 mL | Freq: Once | INTRAVENOUS | Status: AC
  Administered 2018-04-27: 1000 mL via INTRAVENOUS

## 2018-04-27 MED ORDER — ALUM & MAG HYDROXIDE-SIMETH 200-200-20 MG/5ML PO SUSP
30.0000 mL | Freq: Once | ORAL | Status: AC
  Administered 2018-04-27: 30 mL via ORAL
  Filled 2018-04-27: qty 30

## 2018-04-27 MED ORDER — LIDOCAINE VISCOUS HCL 2 % MT SOLN
15.0000 mL | Freq: Once | OROMUCOSAL | Status: AC
  Administered 2018-04-27: 15 mL via ORAL
  Filled 2018-04-27: qty 15

## 2018-04-27 MED ORDER — ONDANSETRON 4 MG PO TBDP: 4.0000 mg | ORAL_TABLET | Freq: Three times a day (TID) | ORAL | 0 refills | Status: AC | PRN

## 2018-04-27 MED ORDER — KETOROLAC TROMETHAMINE 30 MG/ML IJ SOLN
30.0000 mg | Freq: Once | INTRAMUSCULAR | Status: AC
  Administered 2018-04-27: 30 mg via INTRAVENOUS
  Filled 2018-04-27: qty 1

## 2018-04-27 MED ORDER — PANTOPRAZOLE SODIUM 40 MG PO TBEC
40.0000 mg | DELAYED_RELEASE_TABLET | Freq: Once | ORAL | Status: AC
  Administered 2018-04-27: 40 mg via ORAL
  Filled 2018-04-27: qty 1

## 2018-04-27 MED ORDER — DICYCLOMINE HCL 20 MG PO TABS: 20.0000 mg | ORAL_TABLET | Freq: Three times a day (TID) | ORAL | 0 refills | Status: DC | PRN

## 2018-04-27 MED ORDER — TRAMADOL HCL 50 MG PO TABS
50.0000 mg | ORAL_TABLET | Freq: Once | ORAL | Status: AC
  Administered 2018-04-27: 50 mg via ORAL
  Filled 2018-04-27: qty 1

## 2018-04-27 MED ORDER — ONDANSETRON HCL 4 MG/2ML IJ SOLN
4.0000 mg | Freq: Once | INTRAMUSCULAR | Status: AC
  Administered 2018-04-27: 4 mg via INTRAVENOUS
  Filled 2018-04-27: qty 2

## 2018-04-27 MED ORDER — DICYCLOMINE HCL 10 MG PO CAPS
10.0000 mg | ORAL_CAPSULE | Freq: Once | ORAL | Status: AC
  Administered 2018-04-27: 10 mg via ORAL
  Filled 2018-04-27: qty 1

## 2018-04-27 NOTE — ED Notes (Signed)
Patient has been changed with a new brief placed and has been dressed in scrub pants due to patient's regular pants are soiled. Patient's sweat shirt  placed on the patient along with hospital socks and his personal shoes. Patient's other belongings were placed in a belongings bag and given to patient along with prescriptions. Staff from [facility] arrived and patient was pick up.

## 2018-04-27 NOTE — ED Notes (Signed)
Several attempts made to the [facility] with no answer or answering machine /voice mail.

## 2018-04-27 NOTE — ED Triage Notes (Signed)
Pt states his stomach has been hurting for a while now but is worse today.  Also having diarrhea.

## 2018-04-27 NOTE — ED Notes (Addendum)
Patient's Iv's removed

## 2018-04-27 NOTE — Discharge Instructions (Signed)

## 2018-04-27 NOTE — ED Provider Notes (Signed)
Emergency Department Provider Note   I have reviewed the triage vital signs and the nursing notes.   HISTORY  Chief Complaint Abdominal Pain   HPI [name] is a 57 y.o. male with PMH of Bipolar disorder, DM, GERD, IBS, and pancreatitis resents to the emergency department for evaluation of worsening abdominal pain with mild diarrhea.  Patient states that his stomach has been hurting for "a while" but states is worsening today.  He states feels similar to pancreatitis pain which she has had in the past.  He denies any fevers, chills, or blood in the emesis or stool.  He does not drink alcohol.  He is currently living in a group home and denies any drug use.  He states today he did have a episode of headedness with vomiting but that is improved.  No radiation of symptoms or other modifying factors.  Past Medical History:  Diagnosis Date  . Anxiety   . Bipolar disorder ([place])   . Chronic pain   . Depression   . Diabetes mellitus without complication ([place])   . Diverticulitis   . GERD (gastroesophageal reflux disease)   . Hypertension   . IBS (irritable bowel syndrome)   . Kidney stone   . Migraine headache   . Neuropathy     Patient Active Problem List   Diagnosis Date Noted  . Uncontrolled type 2 diabetes mellitus with hyperglycemia ([place]) 03/13/2018  . Diabetic foot ulcer ([place]) 03/13/2018  . Cellulitis in diabetic foot ([place]) 03/13/2018  . Transaminasemia   . Heel ulcer ([place]) 03/12/2018  . Chest pain 10/05/2017  . Bipolar disorder ([place])   . Fever 12/04/2014  . Cellulitis of left foot 12/04/2014  . Diabetes mellitus with neuropathy ([place]) 12/04/2014  . Hypertension 12/04/2014    Past Surgical History:  Procedure Laterality Date  . ANKLE SURGERY Right   . CARPAL TUNNEL RELEASE    . CHOLECYSTECTOMY    . COLONOSCOPY     Allergies Meloxicam; Venlafaxine; and Penicillins  Family History  Problem Relation Age of Onset  . CAD Mother   . CAD Brother     Social  History Social History   Tobacco Use  . Smoking status: Never Smoker  . Smokeless tobacco: Never Used  Substance Use Topics  . Alcohol use: No  . Drug use: Not Currently    Review of Systems  Constitutional: No fever/chills Eyes: No visual changes. ENT: No sore throat. Cardiovascular: Denies chest pain. Respiratory: Denies shortness of breath. Gastrointestinal: Positive epigastric abdominal pain. Positive nausea, vomiting, and diarrhea.  No constipation. Genitourinary: Negative for dysuria. Musculoskeletal: Negative for back pain. Skin: Negative for rash. Neurological: Negative for headaches, focal weakness or numbness.  10-point ROS otherwise negative.  ____________________________________________   PHYSICAL EXAM:  VITAL SIGNS: ED Triage Vitals  Enc Vitals Group     BP 04/27/18 1342 127/84     Pulse Rate 04/27/18 1342 (!) 108     Resp 04/27/18 1342 16     Temp 04/27/18 1342 98.2 F (36.8 C)     Temp Source 04/27/18 1342 Oral     SpO2 04/27/18 1342 100 %     Weight 04/27/18 1341 194 lb 0.1 oz (88 kg)     Height 04/27/18 1341 5\' 10"  (1.778 m)     Pain Score 04/27/18 1341 9   Constitutional: Alert and oriented. Well appearing and in no acute distress. Eyes: Conjunctivae are normal.  Head: Atraumatic. Nose: No congestion/rhinnorhea. Mouth/Throat: Mucous membranes are moist.  Oropharynx non-erythematous. Neck: No stridor.   Cardiovascular: Tachycardia. Good peripheral circulation. Grossly normal heart sounds.   Respiratory: Normal respiratory effort.  No retractions. Lungs CTAB. Gastrointestinal: Soft with mild diffuse tenderness. No rebound, guarding, or peritoneal findings. No distention.  Musculoskeletal: No lower extremity tenderness nor edema. No gross deformities of extremities. Neurologic:  Normal speech and language. No gross focal neurologic deficits are appreciated.  Skin:  Skin is warm, dry and intact. No rash  noted.  ____________________________________________   LABS (all labs ordered are listed, but only abnormal results are displayed)  Labs Reviewed  COMPREHENSIVE METABOLIC PANEL - Abnormal; Notable for the following components:      Result Value   Glucose, Bld 165 (*)    BUN 25 (*)    Alkaline Phosphatase 147 (*)    All other components within normal limits  URINALYSIS, ROUTINE W REFLEX MICROSCOPIC - Abnormal; Notable for the following components:   Glucose, UA 50 (*)    Ketones, ur 20 (*)    All other components within normal limits  LIPASE, BLOOD  TROPONIN I  CBC WITH DIFFERENTIAL/PLATELET   ____________________________________________  EKG   EKG Interpretation  Date/Time:  Monday April 27 2018 14:28:56 EST Ventricular Rate:  84 PR Interval:    QRS Duration: 107 QT Interval:  384 QTC Calculation: 454 R Axis:   -18 Text Interpretation:  Sinus rhythm Multiform ventricular premature complexes Borderline prolonged PR interval No STEMI.  Similar to prior tracing.  Confirmed by Nanda Quinton [phone] on 04/27/2018 2:31:52 PM       ____________________________________________  RADIOLOGY  Ct Renal Stone Study  Result Date: 04/27/2018 CLINICAL DATA:  Flank pain with stone disease suspected EXAM: CT ABDOMEN AND PELVIS WITHOUT CONTRAST TECHNIQUE: Multidetector CT imaging of the abdomen and pelvis was performed following the standard protocol without IV contrast. COMPARISON:  None. FINDINGS: Lower chest:  No contributory findings. Hepatobiliary: No focal liver abnormality.Cholecystectomy with normal common bile duct diameter Pancreas: Unremarkable. Spleen: Unremarkable. Adrenals/Urinary Tract: Negative adrenals. No hydronephrosis or stone. 1 cm cystic density in the interpolar right kidney unremarkable bladder. Stomach/Bowel: No obstruction or inflammatory changes. No appendicitis. Vascular/Lymphatic: No acute vascular abnormality. No mass or adenopathy. Reproductive:Negative.  Other: No ascites or pneumoperitoneum. Musculoskeletal: No acute abnormalities. 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 Prominent degenerative spurring at the symphysis pubis. T10 and T11 superior endplate depressions with subchondral low-density fracture cleft. There is sclerosis around the fracture margins suggesting subacute timing. These fractures have occurred since 10/10/2017 chest CT. Height loss is mild. IMPRESSION: 1. No acute finding.  No hydronephrosis or urolithiasis. 2. T10 and T11 superior endplate fractures with subacute appearance Electronically Signed   By: Monte Fantasia M.D.   On: 04/27/2018 18:46    ____________________________________________   PROCEDURES  Procedure(s) performed:   Procedures  None ____________________________________________   INITIAL IMPRESSION / ASSESSMENT AND PLAN / ED COURSE  Pertinent labs & imaging results that were available during my care of the patient were reviewed by me and considered in my medical decision making (see chart for details).  Presents to the emergency department for evaluation of epigastric abdominal pain, nausea, vomiting, diarrhea.  Patient states his symptoms have been going on for weeks but worsening today.  Does have history of IBS and pancreatitis and states this feels similar to pancreatitis pain is had in the past.  Patient has mild tachycardia but afebrile.  Overall well-appearing with no concerning findings on abdominal exam at this time.  Plan for screening labs, IV  fluids, pain/nausea medication, and reassess.  Will obtain EKG with report of lightheadedness earlier today.   Labs reviewed. Patient with continued pain. CT renal obtained with continued pain and no acute findings. Subacute superior end-plate fractures. No back pain at this time. Patient is eating here in the ED without significant pain. Plan for discharge with plan for PCP follow up.  ____________________________________________  FINAL CLINICAL  IMPRESSION(S) / ED DIAGNOSES  Final diagnoses:  Generalized abdominal pain  Non-intractable vomiting with nausea, unspecified vomiting type     MEDICATIONS GIVEN DURING THIS VISIT:  Medications  sodium chloride 0.9 % bolus 1,000 mL (0 mLs Intravenous Stopped 04/27/18 1645)  ketorolac (TORADOL) 30 MG/ML injection 30 mg (30 mg Intravenous Given 04/27/18 1429)  ondansetron (ZOFRAN) injection 4 mg (4 mg Intravenous Given 04/27/18 1430)  dicyclomine (BENTYL) capsule 10 mg (10 mg Oral Given 04/27/18 1645)  alum & mag hydroxide-simeth (MAALOX/MYLANTA) 200-200-20 MG/5ML suspension 30 mL (30 mLs Oral Given 04/27/18 1643)    And  lidocaine (XYLOCAINE) 2 % viscous mouth solution 15 mL (15 mLs Oral Given 04/27/18 1643)  traMADol (ULTRAM) tablet 50 mg (50 mg Oral Given 04/27/18 1808)  pantoprazole (PROTONIX) EC tablet 40 mg (40 mg Oral Given 04/27/18 1809)     NEW OUTPATIENT MEDICATIONS STARTED DURING THIS VISIT:  New Prescriptions   DICYCLOMINE (BENTYL) 20 MG TABLET    Take 1 tablet (20 mg total) by mouth 3 (three) times daily as needed for spasms (and abdominal cramping).   ONDANSETRON (ZOFRAN ODT) 4 MG DISINTEGRATING TABLET    Take 1 tablet (4 mg total) by mouth every 8 (eight) hours as needed for nausea or vomiting.   PANTOPRAZOLE (PROTONIX) 40 MG TABLET    Take 1 tablet (40 mg total) by mouth daily.   SUCRALFATE (CARAFATE) 1 G TABLET    Take 1 tablet (1 g total) by mouth 4 (four) times daily -  with meals and at bedtime for 14 days.    Note:  This document was prepared using Dragon voice recognition software and may include unintentional dictation errors.  Nanda Quinton, MD Emergency Medicine    Jayel Inks, [facility], MD 04/27/18 2114

## 2018-04-27 NOTE — ED Notes (Signed)
Patient given meal tray.

## 2018-04-27 NOTE — ED Notes (Signed)
Attempted to call [facility] with no answer. Patient given soda and snacks.

## 2018-04-27 NOTE — ED Notes (Signed)
Entered room and found pt to be incontinent of stool and had removed diaper and spread stool everywhere.  Pt states he does not know why he did this but he did.  Cleaned and put back into bed.

## 2018-04-30 DIAGNOSIS — L97412 Non-pressure chronic ulcer of right heel and midfoot with fat layer exposed: Secondary | ICD-10-CM | POA: Diagnosis not present

## 2018-04-30 DIAGNOSIS — E1142 Type 2 diabetes mellitus with diabetic polyneuropathy: Secondary | ICD-10-CM | POA: Diagnosis not present

## 2018-04-30 DIAGNOSIS — E11621 Type 2 diabetes mellitus with foot ulcer: Secondary | ICD-10-CM | POA: Diagnosis not present

## 2018-04-30 DIAGNOSIS — E1143 Type 2 diabetes mellitus with diabetic autonomic (poly)neuropathy: Secondary | ICD-10-CM | POA: Diagnosis not present

## 2018-04-30 DIAGNOSIS — K3184 Gastroparesis: Secondary | ICD-10-CM | POA: Diagnosis not present

## 2018-04-30 DIAGNOSIS — L89620 Pressure ulcer of left heel, unstageable: Secondary | ICD-10-CM | POA: Diagnosis not present

## 2018-05-05 DIAGNOSIS — E1142 Type 2 diabetes mellitus with diabetic polyneuropathy: Secondary | ICD-10-CM | POA: Diagnosis not present

## 2018-05-05 DIAGNOSIS — K3184 Gastroparesis: Secondary | ICD-10-CM | POA: Diagnosis not present

## 2018-05-05 DIAGNOSIS — E1143 Type 2 diabetes mellitus with diabetic autonomic (poly)neuropathy: Secondary | ICD-10-CM | POA: Diagnosis not present

## 2018-05-05 DIAGNOSIS — L89620 Pressure ulcer of left heel, unstageable: Secondary | ICD-10-CM | POA: Diagnosis not present

## 2018-05-05 DIAGNOSIS — E11621 Type 2 diabetes mellitus with foot ulcer: Secondary | ICD-10-CM | POA: Diagnosis not present

## 2018-05-05 DIAGNOSIS — L97412 Non-pressure chronic ulcer of right heel and midfoot with fat layer exposed: Secondary | ICD-10-CM | POA: Diagnosis not present

## 2018-05-12 DIAGNOSIS — L97412 Non-pressure chronic ulcer of right heel and midfoot with fat layer exposed: Secondary | ICD-10-CM | POA: Diagnosis not present

## 2018-05-12 DIAGNOSIS — E11621 Type 2 diabetes mellitus with foot ulcer: Secondary | ICD-10-CM | POA: Diagnosis not present

## 2018-05-12 DIAGNOSIS — L89620 Pressure ulcer of left heel, unstageable: Secondary | ICD-10-CM | POA: Diagnosis not present

## 2018-05-12 DIAGNOSIS — K3184 Gastroparesis: Secondary | ICD-10-CM | POA: Diagnosis not present

## 2018-05-12 DIAGNOSIS — E1143 Type 2 diabetes mellitus with diabetic autonomic (poly)neuropathy: Secondary | ICD-10-CM | POA: Diagnosis not present

## 2018-05-12 DIAGNOSIS — E1142 Type 2 diabetes mellitus with diabetic polyneuropathy: Secondary | ICD-10-CM | POA: Diagnosis not present

## 2018-05-14 ENCOUNTER — Emergency Department (HOSPITAL_COMMUNITY)
Admission: EM | Admit: 2018-05-14 | Discharge: 2018-05-15 | Disposition: A | Discharge status: Other Acute Inpatient Hospital | Payer: Medicare Other | Attending: Emergency Medicine | Admitting: Emergency Medicine

## 2018-05-14 ENCOUNTER — Encounter (HOSPITAL_COMMUNITY): Payer: Self-pay | Admitting: Emergency Medicine

## 2018-05-14 ENCOUNTER — Emergency Department (HOSPITAL_COMMUNITY): Payer: Medicare Other

## 2018-05-14 DIAGNOSIS — R1030 Lower abdominal pain, unspecified: Secondary | ICD-10-CM | POA: Diagnosis not present

## 2018-05-14 DIAGNOSIS — F315 Bipolar disorder, current episode depressed, severe, with psychotic features: Secondary | ICD-10-CM | POA: Insufficient documentation

## 2018-05-14 DIAGNOSIS — R109 Unspecified abdominal pain: Secondary | ICD-10-CM | POA: Insufficient documentation

## 2018-05-14 DIAGNOSIS — R45851 Suicidal ideations: Secondary | ICD-10-CM | POA: Insufficient documentation

## 2018-05-14 DIAGNOSIS — E1165 Type 2 diabetes mellitus with hyperglycemia: Secondary | ICD-10-CM | POA: Insufficient documentation

## 2018-05-14 DIAGNOSIS — N281 Cyst of kidney, acquired: Secondary | ICD-10-CM | POA: Diagnosis not present

## 2018-05-14 DIAGNOSIS — E1140 Type 2 diabetes mellitus with diabetic neuropathy, unspecified: Secondary | ICD-10-CM | POA: Insufficient documentation

## 2018-05-14 DIAGNOSIS — Z794 Long term (current) use of insulin: Secondary | ICD-10-CM | POA: Insufficient documentation

## 2018-05-14 DIAGNOSIS — Z79899 Other long term (current) drug therapy: Secondary | ICD-10-CM | POA: Insufficient documentation

## 2018-05-14 DIAGNOSIS — K5790 Diverticulosis of intestine, part unspecified, without perforation or abscess without bleeding: Secondary | ICD-10-CM | POA: Diagnosis not present

## 2018-05-14 LAB — COMPREHENSIVE METABOLIC PANEL
ALT: 15 U/L (ref 0–44)
AST: 13 U/L — ABNORMAL LOW (ref 15–41)
Albumin: 3.6 g/dL (ref 3.5–5.0)
Alkaline Phosphatase: 101 U/L (ref 38–126)
Anion gap: 6 (ref 5–15)
BUN: 20 mg/dL (ref 6–20)
CO2: 20 mmol/L — ABNORMAL LOW (ref 22–32)
CREATININE: 0.91 mg/dL (ref 0.61–1.24)
Calcium: 8.7 mg/dL — ABNORMAL LOW (ref 8.9–10.3)
Chloride: 108 mmol/L (ref 98–111)
GFR calc Af Amer: >60 mL/min (ref >60)
GFR calc non Af Amer: >60 mL/min (ref >60)
Glucose, Bld: 253 mg/dL — ABNORMAL HIGH (ref 70–99)
Potassium: 3.8 mmol/L (ref 3.5–5.1)
Sodium: 134 mmol/L — ABNORMAL LOW (ref 135–145)
Total Bilirubin: 0.6 mg/dL (ref 0.3–1.2)
Total Protein: 7.1 g/dL (ref 6.5–8.1)

## 2018-05-14 LAB — URINALYSIS, ROUTINE W REFLEX MICROSCOPIC
Bacteria, UA: NONE SEEN
Bilirubin Urine: NEGATIVE
Glucose, UA: >=500 mg/dL — AB
HGB URINE DIPSTICK: NEGATIVE
Ketones, ur: NEGATIVE mg/dL
Leukocytes, UA: NEGATIVE
Nitrite: NEGATIVE
Protein, ur: NEGATIVE mg/dL
Specific Gravity, Urine: 1.023 (ref 1.005–1.030)
pH: 6 (ref 5.0–8.0)

## 2018-05-14 LAB — CBC WITH DIFFERENTIAL/PLATELET
Abs Immature Granulocytes: 0.03 10*3/uL (ref 0.00–0.07)
Basophils Absolute: 0 10*3/uL (ref 0.0–0.1)
Basophils Relative: 0 %
Eosinophils Absolute: 0.1 10*3/uL (ref 0.0–0.5)
Eosinophils Relative: 1 %
HCT: 45.3 % (ref 39.0–52.0)
Hemoglobin: 14.3 g/dL (ref 13.0–17.0)
Immature Granulocytes: 0 %
Lymphocytes Relative: 34 %
Lymphs Abs: 2.9 10*3/uL (ref 0.7–4.0)
MCH: 28.2 pg (ref 26.0–34.0)
MCHC: 31.6 g/dL (ref 30.0–36.0)
MCV: 89.3 fL (ref 80.0–100.0)
Monocytes Absolute: 0.6 10*3/uL (ref 0.1–1.0)
Monocytes Relative: 7 %
Neutro Abs: 4.9 10*3/uL (ref 1.7–7.7)
Neutrophils Relative %: 58 %
Platelets: 317 10*3/uL (ref 150–400)
RBC: 5.07 MIL/uL (ref 4.22–5.81)
RDW: 13.9 % (ref 11.5–15.5)
WBC: 8.6 10*3/uL (ref 4.0–10.5)
nRBC: 0 % (ref 0.0–0.2)

## 2018-05-14 LAB — RAPID URINE DRUG SCREEN, HOSP PERFORMED
Amphetamines: NOT DETECTED
Barbiturates: NOT DETECTED
Benzodiazepines: NOT DETECTED
Cocaine: NOT DETECTED
Opiates: NOT DETECTED
Tetrahydrocannabinol: NOT DETECTED

## 2018-05-14 LAB — CBG MONITORING, ED: Glucose-Capillary: 217 mg/dL — ABNORMAL HIGH (ref 70–99)

## 2018-05-14 LAB — LIPASE, BLOOD: Lipase: 34 U/L (ref 11–51)

## 2018-05-14 LAB — ETHANOL: Alcohol, Ethyl (B): <10 mg/dL (ref <10)

## 2018-05-14 MED ORDER — IOPAMIDOL (ISOVUE-300) INJECTION 61%
100.0000 mL | Freq: Once | INTRAVENOUS | Status: AC | PRN
  Administered 2018-05-14: 100 mL via INTRAVENOUS

## 2018-05-14 MED ORDER — ACETAMINOPHEN 325 MG PO TABS
650.0000 mg | ORAL_TABLET | Freq: Once | ORAL | Status: AC
  Administered 2018-05-14: 650 mg via ORAL
  Filled 2018-05-14: qty 2

## 2018-05-14 MED ORDER — SODIUM CHLORIDE 0.9 % IV BOLUS
1000.0000 mL | Freq: Once | INTRAVENOUS | Status: AC
  Administered 2018-05-14: 1000 mL via INTRAVENOUS

## 2018-05-14 MED ORDER — ONDANSETRON HCL 4 MG/2ML IJ SOLN
4.0000 mg | Freq: Once | INTRAMUSCULAR | Status: AC
  Administered 2018-05-14: 4 mg via INTRAVENOUS
  Filled 2018-05-14: qty 2

## 2018-05-14 NOTE — ED Triage Notes (Signed)
Pt from [place] and called ems himself stating he was suicidal and wanted to leave there.  Glucose was 324, fluids given by ems.

## 2018-05-14 NOTE — ED Provider Notes (Signed)
[facility] EMERGENCY DEPARTMENT Provider Note   CSN: [id number] Arrival date & time: 05/14/18  1724     History   Chief Complaint Chief Complaint  Patient presents with  . V70.1    HPI [name] is a 57 y.o. male.  HPI  57 year old male presents with suicidal thoughts.  He states for the last couple days he has been hearing voices telling him to kill himself and he has been feeling more more suicidal.  He does not have a plan but is currently formulating one.  This is happened to him before.  On review of systems he notes that he is been having vomiting for last couple days as well as lower abdominal pain.  Feels like prior diverticulitis.  He states his glucose has not been well controlled recently.  However he does not give any numbers.  Past Medical History:  Diagnosis Date  . Anxiety   . Bipolar disorder ([place])   . Chronic pain   . Depression   . Diabetes mellitus without complication ([place])   . Diverticulitis   . GERD (gastroesophageal reflux disease)   . Hypertension   . IBS (irritable bowel syndrome)   . Kidney stone   . Migraine headache   . Neuropathy     Patient Active Problem List   Diagnosis Date Noted  . Uncontrolled type 2 diabetes mellitus with hyperglycemia ([place]) 03/13/2018  . Diabetic foot ulcer ([place]) 03/13/2018  . Cellulitis in diabetic foot ([place]) 03/13/2018  . Transaminasemia   . Heel ulcer ([place]) 03/12/2018  . Chest pain 10/05/2017  . Bipolar disorder ([place])   . Fever 12/04/2014  . Cellulitis of left foot 12/04/2014  . Diabetes mellitus with neuropathy ([place]) 12/04/2014  . Hypertension 12/04/2014    Past Surgical History:  Procedure Laterality Date  . ANKLE SURGERY Right   . CARPAL TUNNEL RELEASE    . CHOLECYSTECTOMY    . COLONOSCOPY          Home Medications    Prior to Admission medications   Medication Sig Start Date End Date Taking? Authorizing Provider  amLODipine (NORVASC) 5 MG tablet Take 1 tablet (5 mg total) by mouth  daily. 03/15/18   Orson Eva, MD  cephALEXin (KEFLEX) 500 MG capsule Take 1 capsule (500 mg total) by mouth every 8 (eight) hours. Patient not taking: Reported on 04/27/2018 03/15/18   Orson Eva, MD  dicyclomine (BENTYL) 20 MG tablet Take 1 tablet (20 mg total) by mouth 3 (three) times daily as needed for spasms (and abdominal cramping). 04/27/18   Long, Wonda Olds, MD  doxycycline (VIBRA-TABS) 100 MG tablet Take 1 tablet (100 mg total) by mouth every 12 (twelve) hours. Patient not taking: Reported on 04/27/2018 03/15/18   Orson Eva, MD  DULoxetine (CYMBALTA) 60 MG capsule Take 60 mg by mouth daily.    [provider]  HYDROcodone-acetaminophen (NORCO) 5-325 MG tablet Take 1 tablet by mouth every 6 (six) hours as needed for moderate pain. 03/15/18   Orson Eva, MD  hydrOXYzine (ATARAX/VISTARIL) 50 MG tablet Take 50 mg by mouth 2 (two) times daily as needed for anxiety.    [provider]  insulin aspart (NOVOLOG) 100 UNIT/ML injection Inject 16 Units into the skin 3 (three) times daily.     [provider]  insulin glargine (LANTUS) 100 UNIT/ML injection Inject 35 Units into the skin daily before breakfast.    [provider]  lipase/protease/amylase (CREON) 36000 UNITS CPEP capsule Take 36,000 Units by  mouth 3 (three) times daily with meals.    [provider]  losartan (COZAAR) 100 MG tablet Take 100 mg by mouth daily.    [provider]  metoprolol tartrate (LOPRESSOR) 50 MG tablet Take 25 mg by mouth 2 (two) times daily.    [provider]  minocycline (MINOCIN,DYNACIN) 100 MG capsule Take 100 mg by mouth 2 (two) times daily. 10 day course    [provider]  ondansetron (ZOFRAN ODT) 4 MG disintegrating tablet Take 1 tablet (4 mg total) by mouth every 8 (eight) hours as needed for nausea or vomiting. 04/27/18   Long, Wonda Olds, MD  pantoprazole (PROTONIX) 40 MG tablet Take 1 tablet (40 mg total) by mouth daily. 04/27/18 05/27/18   Long, Wonda Olds, MD  pregabalin (LYRICA) 200 MG capsule Take 200 mg by mouth 2 (two) times daily.     [provider]  sucralfate (CARAFATE) 1 g tablet Take 1 tablet (1 g total) by mouth 4 (four) times daily -  with meals and at bedtime for 14 days. 04/27/18 05/11/18  LongWonda Olds, MD    Family History Family History  Problem Relation Age of Onset  . CAD Mother   . CAD Brother     Social History Social History   Tobacco Use  . Smoking status: Never Smoker  . Smokeless tobacco: Never Used  Substance Use Topics  . Alcohol use: No  . Drug use: Not Currently     Allergies   Meloxicam; Venlafaxine; and Penicillins   Review of Systems Review of Systems  Constitutional: Negative for fever.  Cardiovascular: Negative for chest pain.  Gastrointestinal: Positive for abdominal pain and vomiting. Negative for diarrhea.  Psychiatric/Behavioral: Positive for hallucinations and suicidal ideas.  All other systems reviewed and are negative.    Physical Exam Updated Vital Signs BP (!) 165/87 (BP Location: Right Arm)   Pulse 62   Temp 98.3 F (36.8 C) (Oral)   Resp 16   Ht 5\' 10"  (1.778 m)   Wt 88 kg   SpO2 100%   BMI 27.84 kg/m   Physical Exam Vitals signs and nursing note reviewed.  Constitutional:      Appearance: He is well-developed. He is not ill-appearing or diaphoretic.  HENT:     Head: Normocephalic and atraumatic.     Right Ear: External ear normal.     Left Ear: External ear normal.     Nose: Nose normal.  Eyes:     General:        Right eye: No discharge.        Left eye: No discharge.  Neck:     Musculoskeletal: Neck supple.  Cardiovascular:     Rate and Rhythm: Normal rate and regular rhythm.     Heart sounds: Normal heart sounds.  Pulmonary:     Effort: Pulmonary effort is normal.     Breath sounds: Normal breath sounds.  Abdominal:     Palpations: Abdomen is soft.     Tenderness: There is abdominal tenderness (mild) in the right lower  quadrant and left lower quadrant.  Skin:    General: Skin is warm and dry.  Neurological:     Mental Status: He is alert.  Psychiatric:        Mood and Affect: Mood is not anxious.        Thought Content: Thought content includes suicidal ideation. Thought content does not include suicidal plan.      ED Treatments /  Results  Labs (all labs ordered are listed, but only abnormal results are displayed) Labs Reviewed  COMPREHENSIVE METABOLIC PANEL - Abnormal; Notable for the following components:      Result Value   Sodium 134 (*)    CO2 20 (*)    Glucose, Bld 253 (*)    Calcium 8.7 (*)    AST 13 (*)    All other components within normal limits  URINALYSIS, ROUTINE W REFLEX MICROSCOPIC - Abnormal; Notable for the following components:   Color, Urine STRAW (*)    Glucose, UA >=500 (*)    All other components within normal limits  CBG MONITORING, ED - Abnormal; Notable for the following components:   Glucose-Capillary 217 (*)    All other components within normal limits  ETHANOL  LIPASE, BLOOD  CBC WITH DIFFERENTIAL/PLATELET  RAPID URINE DRUG SCREEN, HOSP PERFORMED    EKG None  Radiology Ct Abdomen Pelvis W Contrast  Result Date: 05/14/2018 CLINICAL DATA:  Elevated glucose and abdominal pain EXAM: CT ABDOMEN AND PELVIS WITH CONTRAST TECHNIQUE: Multidetector CT imaging of the abdomen and pelvis was performed using the standard protocol following bolus administration of intravenous contrast. CONTRAST:  167mL ISOVUE-300 IOPAMIDOL (ISOVUE-300) INJECTION 61% COMPARISON:  04/27/2018 FINDINGS: Lower chest: No acute abnormality. Hepatobiliary: No focal liver abnormality is seen. Status post cholecystectomy. No biliary dilatation. Pancreas: Unremarkable. No pancreatic ductal dilatation or surrounding inflammatory changes. Spleen: Normal in size without focal abnormality. Adrenals/Urinary Tract: Adrenal glands are within normal limits bilaterally. Kidneys are well visualize without renal  calculi or obstructive changes. Small cyst is noted within the right kidney measuring 1 cm. The ureters are within normal limits. Bladder is well distended. Stomach/Bowel: Scattered diverticular changes noted without evidence of diverticulitis. The appendix is within normal limits. No obstructive or inflammatory bowel abnormality is noted. Stomach is decompressed. Vascular/Lymphatic: No significant vascular findings are present. No enlarged abdominal or pelvic lymph nodes. Reproductive: Prostate is unremarkable. Other: No abdominal wall hernia or abnormality. No abdominopelvic ascites. Musculoskeletal: Degenerative changes of lumbar spine are seen. IMPRESSION: Diverticulosis without diverticulitis. Small right renal cyst. Electronically Signed   By: Inez Catalina M.D.   On: 05/14/2018 21:08    Procedures Procedures (including critical care time)  Medications Ordered in ED Medications  sodium chloride 0.9 % bolus 1,000 mL (1,000 mLs Intravenous New Bag/Given 05/14/18 1902)  ondansetron (ZOFRAN) injection 4 mg (4 mg Intravenous Given 05/14/18 1902)  acetaminophen (TYLENOL) tablet 650 mg (650 mg Oral Given 05/14/18 1902)  iopamidol (ISOVUE-300) 61 % injection 100 mL (100 mLs Intravenous Contrast Given 05/14/18 2036)     Initial Impression / Assessment and Plan / ED Course  I have reviewed the triage vital signs and the nursing notes.  Pertinent labs & imaging results that were available during my care of the patient were reviewed by me and considered in my medical decision making (see chart for details).     Patient has some mild lower abdominal pain and given his history of prior diverticulitis and age, CT obtained.  This is negative.  His glucose is elevated but his anion gap is 6 and he has no ketones in his urine.  I do not believe he has DKA.  Given the negative CT, combined with benign lab work, he appears medically stable for psychiatric disposition.  TTS has been consulted.  Final  Clinical Impressions(s) / ED Diagnoses   Final diagnoses:  Suicidal ideation  Lower abdominal pain    ED Discharge Orders  None       Sherwood Gambler, MD 05/14/18 2116

## 2018-05-14 NOTE — BH Assessment (Signed)
Tele Assessment Note   Patient Name: [name] MRN: [id number] Referring Physician: Dr. Ronne [name] Location of Patient: [facility] Location of Provider: [name] is an 57 y.o. male.  -Clinician reviewed note by Dr. Regenia Skeeter.  57 year old male presents with suicidal thoughts.  He states for the last couple days he has been hearing voices telling him to kill himself and he has been feeling more more suicidal.  He does not have a plan but is currently formulating one.  This is happened to him before.  On review of systems he notes that he is been having vomiting for last couple days as well as lower abdominal pain.  Feels like prior diverticulitis.  He states his glucose has not been well controlled recently.  However he does not give any numbers.  Patient has been having a lot of depression over the last month.  He reports that it has been getting worse over the last few days.  Patient says he wants to kill himself but has not developed a plan.  He did say that he was thinking about cutting himself earlier.  Patient denies a hx of attempts.  Patient says he has no current HI or visual hallucinations.  He is hearing voices telling him bad things and to kill himself.  He denies any SA issues.  Patient says that his sister killed herself this past May.  He also says coping with his mental illness is hard on him.  He is living at [name] and does not like it there.  Patient called EMS from there.  Patient has been followed by the [name] [name] for the past two years for med management and therapy.  He was at [name] in 12/2017 and 10/2017 for inpatient care.  -Clinician discussed patient care with Travis Clan, PA who recommends inpatient psychiatric care.  Dr. Roderic [name] informed of disposition.  AC Penny reviewed patient but [name] has no appropriate bed at this time.  TTS to seek placement.  Diagnosis: F31.5 Bipolar 1 d/o  most recent episode depressed w/ psychotic features  Past Medical History:  Past Medical History:  Diagnosis Date  . Anxiety   . Bipolar disorder ([place])   . Chronic pain   . Depression   . Diabetes mellitus without complication ([place])   . Diverticulitis   . GERD (gastroesophageal reflux disease)   . Hypertension   . IBS (irritable bowel syndrome)   . Kidney stone   . Migraine headache   . Neuropathy     Past Surgical History:  Procedure Laterality Date  . ANKLE SURGERY Right   . CARPAL TUNNEL RELEASE    . CHOLECYSTECTOMY    . COLONOSCOPY      Family History:  Family History  Problem Relation Age of Onset  . CAD Mother   . CAD Brother     Social History:  reports that he has never smoked. He has never used smokeless tobacco. He reports previous drug use. He reports that he does not drink alcohol.  Additional Social History:  Alcohol / Drug Use Pain Medications: See PTA medication list Prescriptions: See PTA medication list Over the Counter: See PTA medication list History of alcohol / drug use?: No history of alcohol / drug abuse  CIWA: CIWA-Ar BP: (!) 165/87 Pulse Rate: 62 COWS:    Allergies:  Allergies  Allergen Reactions  . Meloxicam Other (See Comments)    Acid Reflux   .  Venlafaxine Other (See Comments)    Acid Reflux  . Penicillins Rash    Has patient had a PCN reaction causing immediate rash, facial/tongue/throat swelling, SOB or lightheadedness with hypotension: Yes Has patient had a PCN reaction causing severe rash involving mucus membranes or skin necrosis: No Has patient had a PCN reaction that required hospitalization: No Has patient had a PCN reaction occurring within the last 10 years: No If all of the above answers are "NO", then may proceed with Cephalosporin use.     Home Medications: (Not in a [name] admission)   OB/GYN Status:  No LMP for male patient.  General Assessment Data Location of Assessment: [facility] TTS Assessment: In  system Is this a Tele or Face-to-Face Assessment?: Tele Assessment Is this an Initial Assessment or a Re-assessment for this encounter?: Initial Assessment Patient Accompanied by:: N/A Language Other than English: No Living Arrangements: In Group Home: (Comment: Name of Group Home)([name] for last 3 months) What gender do you identify as?: Male Marital status: Single Pregnancy Status: No Living Arrangements: [facility]) Can pt return to current living arrangement?: Yes Admission Status: Voluntary Is patient capable of signing voluntary admission?: Yes Referral Source: Self/Family/Friend(Pt called EMS because of suicidal feelings.) Insurance type: [employer] [name]     Crisis Care Plan Living Arrangements: Group Home([facility]) [facility] of Psychiatrist[facility] [place] [facility] [name] [name]  Education Status Is patient currently in school?: No Is the patient employed, unemployed or receiving disability?: Receiving disability income  Risk to self with the past 6 months Suicidal Ideation: Yes-Currently Present Has patient been a risk to self within the past 6 months prior to admission? : Yes Suicidal Intent: Yes-Currently Present Has patient had any suicidal intent within the past 6 months prior to admission? : Yes Is patient at risk for suicide?: Yes Suicidal Plan?: No-Not Currently/Within Last 6 Months Has patient had any suicidal plan within the past 6 months prior to admission? : Yes Access to Means: Yes Specify Access to Suicidal Means: Pt has not yet formulated a plan. What has been your use of drugs/alcohol within the last 12 months?: None Previous Attempts/Gestures: No How many times?: 0 Other Self Harm Risks: None Triggers for Past Attempts: None known Intentional Self Injurious Behavior: None Family Suicide History: Yes(Sister killed herself in May '19.) Recent stressful life event(s): Loss (Comment), Turmoil  (Comment)(coping with mental illness) Persecutory voices/beliefs?: Yes Depression: Yes Depression Symptoms: Despondent, Insomnia, Isolating, Guilt, Loss of interest in usual pleasures, Feeling worthless/self pity Substance abuse history and/or treatment for substance abuse?: No Suicide prevention information given to non-admitted patients: Not applicable  Risk to Others within the past 6 months Homicidal Ideation: No Does patient have any lifetime risk of violence toward others beyond the six months prior to admission? : No Thoughts of Harm to Others: No Current Homicidal Intent: No Current Homicidal Plan: No Access to Homicidal Means: No Identified Victim: No one History of harm to others?: No Assessment of Violence: None Noted Violent Behavior Description: "No, not really." Does patient have access to weapons?: Yes (Comment) Criminal Charges Pending?: No Does patient have a court date: No Is patient on probation?: No  Psychosis Hallucinations: Auditory(Voices telling him to kill self, negative things.) Delusions: None noted  Mental Status Report Appearance/Hygiene: Disheveled, Body odor, Poor hygiene, In scrubs Eye Contact: Fair Motor Activity: Freedom of movement, Unremarkable Speech: Logical/coherent Level of Consciousness: Alert Mood: Depressed, Guilty, Sad, Anxious Affect: Anxious,  Sad Anxiety Level: Panic Attacks Panic attack frequency: Daily Most recent panic attack: Today Thought Processes: Coherent, Relevant Judgement: Impaired Orientation: Person, Place, Situation Obsessive Compulsive Thoughts/Behaviors: None  Cognitive Functioning Concentration: Poor Memory: Recent Impaired, Remote Intact Is patient IDD: No Insight: Good Impulse Control: Fair Appetite: Poor Have you had any weight changes? : Loss Amount of the weight change? (lbs): (20lbs in last few months) Sleep: Decreased Total Hours of Sleep: (<4H/D) Vegetative Symptoms: Decreased  grooming  ADLScreening [name]) Patient's cognitive ability adequate to safely complete daily activities?: Yes Patient able to express need for assistance with ADLs?: Yes Independently performs ADLs?: Yes (appropriate for developmental age)  Prior Inpatient Therapy Prior Inpatient Therapy: Yes Prior Therapy Dates: 12/2017 & 10/2017 Prior Therapy Facilty/Provider(s): [place] Reason for Treatment: SI  Prior Outpatient Therapy Prior Outpatient Therapy: Yes Prior Therapy Dates: Last 2 years Prior Therapy Facilty/Provider(s): [facility] [place] Reason for Treatment: med management and therapy Does patient have an ACCT team?: No Does patient have Intensive In-House Services?  : No Does patient have [facility] services? : No Does patient have P4CC services?: No  ADL Screening (condition at time of admission) Patient's cognitive ability adequate to safely complete daily activities?: Yes Is the patient deaf or have difficulty hearing?: Yes(Left eardrum surgery two years ago.) Does the patient have difficulty seeing, even when wearing glasses/contacts?: Yes(Vision is bad due to diabetes he reports.  No glasses.) Does the patient have difficulty concentrating, remembering, or making decisions?: Yes Patient able to express need for assistance with ADLs?: Yes Does the patient have difficulty dressing or bathing?: No Independently performs ADLs?: Yes (appropriate for developmental age) Does the patient have difficulty walking or climbing stairs?: Yes Weakness of Legs: Both(Has a walker he uses sometimes.) Weakness of Arms/Hands: Both(Pt says he has trouble with both due to neuropathy.)  Home Assistive Devices/Equipment Home Assistive Devices/Equipment: Environmental consultant (specify type)    Abuse/Neglect Assessment (Assessment to be complete while patient is alone) Abuse/Neglect Assessment Can Be Completed: Yes Physical Abuse: Denies Verbal Abuse: Denies Sexual Abuse:  Denies Exploitation of patient/patient's resources: Denies Self-Neglect: Denies     Regulatory affairs officer (For Healthcare) Does Patient Have a Medical Advance Directive?: No Would patient like information on creating a medical advance directive?: No - Patient declined          Disposition:  Disposition Initial Assessment Completed for this Encounter: Yes Patient referred to: Other (Comment)(To be reviewed with PA)  This service was provided via telemedicine using a 2-way, interactive audio and video technology.  Names of all persons participating in this telemedicine service and their role in this encounter. Name: Cyan Moultrie Role: patient  Name: Curlene Dolphin, M.S. LCAS QP Role: *clinician  Name:  Role:   Name:  Role:     Raymondo Band 05/14/2018 9:55 PM

## 2018-05-15 ENCOUNTER — Other Ambulatory Visit: Payer: Self-pay

## 2018-05-15 ENCOUNTER — Inpatient Hospital Stay (HOSPITAL_COMMUNITY)
Admission: AD | Admit: 2018-05-15 | Discharge: 2018-05-19 | DRG: 885 | Disposition: A | Discharge status: Home / Self Care | Payer: Medicare Other | Source: Intra-hospital | Attending: Psychiatry | Admitting: Psychiatry

## 2018-05-15 ENCOUNTER — Encounter (HOSPITAL_COMMUNITY): Payer: Self-pay

## 2018-05-15 DIAGNOSIS — R32 Unspecified urinary incontinence: Secondary | ICD-10-CM | POA: Diagnosis present

## 2018-05-15 DIAGNOSIS — Z880 Allergy status to penicillin: Secondary | ICD-10-CM | POA: Diagnosis not present

## 2018-05-15 DIAGNOSIS — F333 Major depressive disorder, recurrent, severe with psychotic symptoms: Principal | ICD-10-CM | POA: Diagnosis present

## 2018-05-15 DIAGNOSIS — K5900 Constipation, unspecified: Secondary | ICD-10-CM | POA: Diagnosis present

## 2018-05-15 DIAGNOSIS — Z87442 Personal history of urinary calculi: Secondary | ICD-10-CM

## 2018-05-15 DIAGNOSIS — Z79899 Other long term (current) drug therapy: Secondary | ICD-10-CM

## 2018-05-15 DIAGNOSIS — K219 Gastro-esophageal reflux disease without esophagitis: Secondary | ICD-10-CM | POA: Diagnosis present

## 2018-05-15 DIAGNOSIS — R4183 Borderline intellectual functioning: Secondary | ICD-10-CM | POA: Diagnosis present

## 2018-05-15 DIAGNOSIS — F411 Generalized anxiety disorder: Secondary | ICD-10-CM | POA: Diagnosis present

## 2018-05-15 DIAGNOSIS — Z886 Allergy status to analgesic agent status: Secondary | ICD-10-CM | POA: Diagnosis not present

## 2018-05-15 DIAGNOSIS — E1042 Type 1 diabetes mellitus with diabetic polyneuropathy: Secondary | ICD-10-CM | POA: Diagnosis present

## 2018-05-15 DIAGNOSIS — R45851 Suicidal ideations: Secondary | ICD-10-CM | POA: Diagnosis present

## 2018-05-15 DIAGNOSIS — I110 Hypertensive heart disease with heart failure: Secondary | ICD-10-CM | POA: Diagnosis present

## 2018-05-15 DIAGNOSIS — F419 Anxiety disorder, unspecified: Secondary | ICD-10-CM | POA: Diagnosis not present

## 2018-05-15 DIAGNOSIS — K589 Irritable bowel syndrome without diarrhea: Secondary | ICD-10-CM | POA: Diagnosis present

## 2018-05-15 DIAGNOSIS — I509 Heart failure, unspecified: Secondary | ICD-10-CM | POA: Diagnosis present

## 2018-05-15 DIAGNOSIS — G4700 Insomnia, unspecified: Secondary | ICD-10-CM | POA: Diagnosis not present

## 2018-05-15 DIAGNOSIS — G8929 Other chronic pain: Secondary | ICD-10-CM | POA: Diagnosis present

## 2018-05-15 DIAGNOSIS — Z794 Long term (current) use of insulin: Secondary | ICD-10-CM | POA: Diagnosis not present

## 2018-05-15 DIAGNOSIS — Z888 Allergy status to other drugs, medicaments and biological substances status: Secondary | ICD-10-CM

## 2018-05-15 DIAGNOSIS — F332 Major depressive disorder, recurrent severe without psychotic features: Secondary | ICD-10-CM | POA: Diagnosis not present

## 2018-05-15 LAB — GLUCOSE, CAPILLARY: Glucose-Capillary: 229 mg/dL — ABNORMAL HIGH (ref 70–99)

## 2018-05-15 MED ORDER — HYDROXYZINE HCL 25 MG PO TABS
25.0000 mg | ORAL_TABLET | Freq: Four times a day (QID) | ORAL | Status: DC | PRN
  Administered 2018-05-15 – 2018-05-18 (×4): 25 mg via ORAL
  Filled 2018-05-15 (×4): qty 1

## 2018-05-15 MED ORDER — PREGABALIN 100 MG PO CAPS
200.0000 mg | ORAL_CAPSULE | Freq: Two times a day (BID) | ORAL | Status: DC
  Administered 2018-05-15 – 2018-05-19 (×9): 200 mg via ORAL
  Filled 2018-05-15 (×9): qty 2

## 2018-05-15 MED ORDER — INSULIN GLARGINE 100 UNIT/ML ~~LOC~~ SOLN
35.0000 [IU] | Freq: Every day | SUBCUTANEOUS | Status: DC
  Administered 2018-05-15 – 2018-05-19 (×5): 35 [IU] via SUBCUTANEOUS

## 2018-05-15 MED ORDER — OLANZAPINE 5 MG PO TBDP: 5.0000 mg | ORAL_TABLET | Freq: Three times a day (TID) | ORAL | Status: DC | PRN

## 2018-05-15 MED ORDER — DULOXETINE HCL 60 MG PO CPEP
60.0000 mg | ORAL_CAPSULE | Freq: Two times a day (BID) | ORAL | Status: DC
  Administered 2018-05-15 – 2018-05-19 (×9): 60 mg via ORAL
  Filled 2018-05-15 (×15): qty 1

## 2018-05-15 MED ORDER — INSULIN ASPART 100 UNIT/ML ~~LOC~~ SOLN
16.0000 [IU] | Freq: Three times a day (TID) | SUBCUTANEOUS | Status: DC
  Administered 2018-05-15 – 2018-05-19 (×12): 16 [IU] via SUBCUTANEOUS

## 2018-05-15 MED ORDER — PANTOPRAZOLE SODIUM 40 MG PO TBEC
40.0000 mg | DELAYED_RELEASE_TABLET | Freq: Two times a day (BID) | ORAL | Status: DC
  Administered 2018-05-15 – 2018-05-19 (×9): 40 mg via ORAL
  Filled 2018-05-15 (×14): qty 1

## 2018-05-15 MED ORDER — ACETAMINOPHEN 325 MG PO TABS: 650.0000 mg | ORAL_TABLET | Freq: Four times a day (QID) | ORAL | Status: DC | PRN

## 2018-05-15 MED ORDER — ALUM & MAG HYDROXIDE-SIMETH 200-200-20 MG/5ML PO SUSP: 30.0000 mL | ORAL | Status: DC | PRN

## 2018-05-15 MED ORDER — TRAZODONE HCL 50 MG PO TABS
50.0000 mg | ORAL_TABLET | Freq: Every evening | ORAL | Status: DC | PRN
  Administered 2018-05-15 – 2018-05-18 (×6): 50 mg via ORAL
  Filled 2018-05-15 (×14): qty 1

## 2018-05-15 MED ORDER — BUSPIRONE HCL 15 MG PO TABS
15.0000 mg | ORAL_TABLET | Freq: Three times a day (TID) | ORAL | Status: DC
  Administered 2018-05-15 – 2018-05-19 (×12): 15 mg via ORAL
  Filled 2018-05-15 (×20): qty 1

## 2018-05-15 MED ORDER — METOPROLOL TARTRATE 50 MG PO TABS
50.0000 mg | ORAL_TABLET | Freq: Two times a day (BID) | ORAL | Status: DC
  Administered 2018-05-15 – 2018-05-17 (×5): 50 mg via ORAL
  Filled 2018-05-15 (×4): qty 1
  Filled 2018-05-15: qty 2
  Filled 2018-05-15 (×2): qty 1
  Filled 2018-05-15: qty 2
  Filled 2018-05-15 (×3): qty 1

## 2018-05-15 MED ORDER — ZIPRASIDONE MESYLATE 20 MG IM SOLR: 20.0000 mg | INTRAMUSCULAR | Status: DC | PRN

## 2018-05-15 MED ORDER — MAGNESIUM HYDROXIDE 400 MG/5ML PO SUSP: 30.0000 mL | Freq: Every day | ORAL | Status: DC | PRN

## 2018-05-15 MED ORDER — AMLODIPINE BESYLATE 5 MG PO TABS
5.0000 mg | ORAL_TABLET | Freq: Every day | ORAL | Status: DC
  Administered 2018-05-15 – 2018-05-18 (×4): 5 mg via ORAL
  Filled 2018-05-15 (×7): qty 1

## 2018-05-15 MED ORDER — LORAZEPAM 1 MG PO TABS: 1.0000 mg | ORAL_TABLET | ORAL | Status: DC | PRN

## 2018-05-15 NOTE — Tx Team (Signed)
Initial Treatment Plan 05/15/2018 5:59 AM [name] [id number]    PATIENT STRESSORS: Health problems Marital or family conflict Medication change or noncompliance   PATIENT STRENGTHS: General fund of knowledge Motivation for treatment/growth   PATIENT IDENTIFIED PROBLEMS: Risk for suicide  Psychosis  Diabetic Ulcer , orthostatic HTN, incontinent  "getting better"               DISCHARGE CRITERIA:  Improved stabilization in mood, thinking, and/or behavior Verbal commitment to aftercare and medication compliance  PRELIMINARY DISCHARGE PLAN: Attend aftercare/continuing care group Attend PHP/IOP Outpatient therapy  PATIENT/FAMILY INVOLVEMENT: This treatment plan has been presented to and reviewed with the patient, [name],.  The patient and family have been given the opportunity to ask questions and make suggestions.  Providence Crosby, RN 05/15/2018, 5:59 AM

## 2018-05-15 NOTE — Plan of Care (Signed)
  Problem: Safety: Goal: Periods of time without injury will increase Outcome: Progressing   Problem: Medication: Goal: Compliance with prescribed medication regimen will improve Outcome: Progressing  Dar Note: Patient presents with flat affect and depressed mood.  Denies suicidal thoughts, auditory and visual hallucinations.  Medication given as prescribed.  Routine safety checks maintained every 15 minutes.  Patient is withdrawn and isolates to his room.  Minimal interaction with staff.  Support and encouragement offered as needed.  Patient is safe on and off the unit.

## 2018-05-15 NOTE — BH Assessment (Signed)
[facility] Assessment Progress Note   AC Kieth Brightly said that there is a room available for patient at [facility].  Patient can come to [facility] 502-1 to Dr. Sheppard Evens.  Patient can come before 05:00 if possible.  Clinician called [facility] and spoke to nurse Legrand Como who said that they can bet patient to [facility] by 05:00.  Patient to come to [facility] voluntarily.  Voluntary admission papers to be faxed to [facility] [phone]) prior to transport.

## 2018-05-15 NOTE — Progress Notes (Signed)
Recreation Therapy Notes  Date: 12.27.19 Time: 1000 Location: 500 Hall Dayroom  Group Topic: Communication, Team Building, Problem Solving  Goal Area(s) Addresses:  Patient will effectively work with peer towards shared goal.  Patient will identify skill used to make activity successful.  Patient will identify how skills used during activity can be used to reach post d/c goals.   Intervention: STEM Activity   Activity: [employer]. In teams, patients were asked to build the tallest freestanding tower possible out of 15 pipe cleaners. Systematically resources were removed, for example patient ability to use both hands and patient ability to verbally communicate.    Education: Education officer, community, Dentist.   Education Outcome: Acknowledges education/In group clarification offered/Needs additional education.   Clinical Observations/Feedback: Pt did not attend group.    [name], LRT/CTRS         Ria Comment, [name] A 05/15/2018 11:30 AM

## 2018-05-15 NOTE — Tx Team (Signed)
Interdisciplinary Treatment and Diagnostic Plan Update  05/15/2018 Time of Session: 9:00am [name] MRN: [id number]  Principal Diagnosis: <principal problem not specified>  Secondary Diagnoses: Active Problems:   MDD (major depressive disorder), recurrent, severe, with psychosis ([place])   Current Medications:  Current Facility-Administered Medications  Medication Dose Route Frequency Provider Last Rate Last Dose  . acetaminophen (TYLENOL) tablet 650 mg  650 mg Oral Q6H PRN Laverle Hobby, PA-C      . alum & mag hydroxide-simeth (MAALOX/MYLANTA) 200-200-20 MG/5ML suspension 30 mL  30 mL Oral Q4H PRN Laverle Hobby, PA-C      . amLODipine (NORVASC) tablet 5 mg  5 mg Oral Daily Johnn Hai, MD      . busPIRone (BUSPAR) tablet 15 mg  15 mg Oral TID Johnn Hai, MD      . DULoxetine (CYMBALTA) DR capsule 60 mg  60 mg Oral BID Johnn Hai, MD      . hydrOXYzine (ATARAX/VISTARIL) tablet 25 mg  25 mg Oral Q6H PRN Laverle Hobby, PA-C      . insulin aspart (novoLOG) injection 16 Units  16 Units Subcutaneous TID WC Johnn Hai, MD      . insulin glargine (LANTUS) injection 35 Units  35 Units Subcutaneous Daily Johnn Hai, MD      . OLANZapine zydis (ZYPREXA) disintegrating tablet 5 mg  5 mg Oral Q8H PRN Laverle Hobby, PA-C       And  . LORazepam (ATIVAN) tablet 1 mg  1 mg Oral PRN Patriciaann Clan E, PA-C       And  . ziprasidone (GEODON) injection 20 mg  20 mg Intramuscular PRN Patriciaann Clan E, PA-C      . magnesium hydroxide (MILK OF MAGNESIA) suspension 30 mL  30 mL Oral Daily PRN Laverle Hobby, PA-C      . metoprolol tartrate (LOPRESSOR) tablet 50 mg  50 mg Oral BID Johnn Hai, MD      . pantoprazole (PROTONIX) EC tablet 40 mg  40 mg Oral BID Johnn Hai, MD      . pregabalin (LYRICA) capsule 200 mg  200 mg Oral BID Johnn Hai, MD      . traZODone (DESYREL) tablet 50 mg  50 mg Oral QHS,MR X 1 Simon, Spencer E, PA-C       PTA Medications: Medications Prior to  Admission  Medication Sig Dispense Refill Last Dose  . amLODipine (NORVASC) 5 MG tablet Take 1 tablet (5 mg total) by mouth daily. 30 tablet 0 Past Week at Unknown time  . DULoxetine (CYMBALTA) 60 MG capsule Take 60 mg by mouth daily.   Past Week at Unknown time  . hydrOXYzine (ATARAX/VISTARIL) 50 MG tablet Take 50 mg by mouth 2 (two) times daily as needed for anxiety.   Past Week at Unknown time  . insulin aspart (NOVOLOG) 100 UNIT/ML injection Inject 16 Units into the skin 3 (three) times daily.    05/14/2018 at Unknown time  . insulin glargine (LANTUS) 100 UNIT/ML injection Inject 35 Units into the skin daily before breakfast.   05/14/2018 at Unknown time  . lipase/protease/amylase (CREON) 36000 UNITS CPEP capsule Take 36,000 Units by mouth 3 (three) times daily with meals.   05/14/2018 at Unknown time  . metoprolol tartrate (LOPRESSOR) 50 MG tablet Take 25 mg by mouth 2 (two) times daily.   Past Week at Unknown time  . pantoprazole (PROTONIX) 40 MG tablet Take 1 tablet (40 mg total) by mouth daily. [place]  tablet 0 Past Week at Unknown time  . pregabalin (LYRICA) 200 MG capsule Take 200 mg by mouth 2 (two) times daily.    Past Week at Unknown time  . ondansetron (ZOFRAN ODT) 4 MG disintegrating tablet Take 1 tablet (4 mg total) by mouth every 8 (eight) hours as needed for nausea or vomiting. 20 tablet 0     Patient Stressors: Health problems Marital or family conflict Medication change or noncompliance  Patient Strengths: General fund of knowledge Motivation for treatment/growth  Treatment Modalities: Medication Management, Group therapy, Case management,  1 to 1 session with clinician, Psychoeducation, Recreational therapy.   Physician Treatment Plan for Primary Diagnosis: <principal problem not specified> Long Term Goal(s): Improvement in symptoms so as ready for discharge Improvement in symptoms so as ready for discharge   Short Term Goals: Ability to identify changes in lifestyle to  reduce recurrence of condition will improve Ability to verbalize feelings will improve Ability to disclose and discuss suicidal ideas Ability to identify and develop effective coping behaviors will improve Ability to maintain clinical measurements within normal limits will improve  Medication Management: Evaluate patient's response, side effects, and tolerance of medication regimen.  Therapeutic Interventions: 1 to 1 sessions, Unit Group sessions and Medication administration.  Evaluation of Outcomes: Not Met  Physician Treatment Plan for Secondary Diagnosis: Active Problems:   MDD (major depressive disorder), recurrent, severe, with psychosis ([facility])  Long Term Goal(s): Improvement in symptoms so as ready for discharge Improvement in symptoms so as ready for discharge   Short Term Goals: Ability to identify changes in lifestyle to reduce recurrence of condition will improve Ability to verbalize feelings will improve Ability to disclose and discuss suicidal ideas Ability to identify and develop effective coping behaviors will improve Ability to maintain clinical measurements within normal limits will improve     Medication Management: Evaluate patient's response, side effects, and tolerance of medication regimen.  Therapeutic Interventions: 1 to 1 sessions, Unit Group sessions and Medication administration.  Evaluation of Outcomes: Not Met   RN Treatment Plan for Primary Diagnosis: <principal problem not specified> Long Term Goal(s): Knowledge of disease and therapeutic regimen to maintain health will improve  Short Term Goals: Ability to remain free from injury will improve, Ability to verbalize frustration and anger appropriately will improve, Ability to demonstrate self-control, Ability to participate in decision making will improve, Ability to verbalize feelings will improve and Compliance with prescribed medications will improve  Medication Management: RN will administer  medications as ordered by provider, will assess and evaluate patient's response and provide education to patient for prescribed medication. RN will report any adverse and/or side effects to prescribing provider.  Therapeutic Interventions: 1 on 1 counseling sessions, Psychoeducation, Medication administration, Evaluate responses to treatment, Monitor vital signs and CBGs as ordered, Perform/monitor CIWA, COWS, AIMS and Fall Risk screenings as ordered, Perform wound care treatments as ordered.  Evaluation of Outcomes: Not Met   LCSW Treatment Plan for Primary Diagnosis: <principal problem not specified> Long Term Goal(s): Safe transition to appropriate next level of care at discharge, Engage patient in therapeutic group addressing interpersonal concerns.  Short Term Goals: Engage patient in aftercare planning with referrals and resources, Increase social support, Increase ability to appropriately verbalize feelings, Increase emotional regulation and Increase skills for wellness and recovery  Therapeutic Interventions: Assess for all discharge needs, 1 to 1 time with Social worker, Explore available resources and support systems, Assess for adequacy in community support network, Educate family and significant other(s) on suicide   prevention, Complete Psychosocial Assessment, Interpersonal group therapy.  Evaluation of Outcomes: Not Met   Progress in Treatment: Attending groups: No. New to unit. Participating in groups: No. Taking medication as prescribed: Yes. Toleration medication: Yes. Family/Significant other contact made: No, will contact:  supports if consent is given Patient understands diagnosis: Yes. Discussing patient identified problems/goals with staff: No. Medical problems stabilized or resolved: No. Denies suicidal/homicidal ideation: No. Issues/concerns per patient self-inventory: Yes.  New problem(s) identified: No, Describe:  continuing to assess  New Short Term/Long Term  Goal(s): medication management for mood stabilization; elimination of SI thoughts; development of comprehensive mental wellness/sobriety plan.  Patient Goals:  "Get feeling better."  Discharge Plan or Barriers: Continuing to assess. MHAG pamphlet, Mobile Crisis information, and AA/NA information provided to patient for additional community support and resources.   Reason for Continuation of Hospitalization: Anxiety Depression Hallucinations Medication stabilization Suicidal ideation  Estimated Length of Stay: 3-5 days  Attendees: Patient: 05/15/2018 9:33 AM  Physician: Dr.Farah 05/15/2018 9:33 AM  Nursing: Michael, RN 05/15/2018 9:33 AM  RN Care Manager: 05/15/2018 9:33 AM  Social Worker:  , LCSWA 05/15/2018 9:33 AM  Recreational Therapist:  05/15/2018 9:33 AM  Other:  05/15/2018 9:33 AM  Other:  05/15/2018 9:33 AM  Other: 05/15/2018 9:33 AM    Scribe for Treatment Team:  C , LCSWA 05/15/2018 9:33 AM 

## 2018-05-15 NOTE — Progress Notes (Signed)
Recreation Therapy Notes  INPATIENT RECREATION THERAPY ASSESSMENT  Patient Details Name: [name] MRN: [id number] DOB: [birth year] Today's Date: 05/15/2018       Information Obtained From: Patient  Able to Participate in Assessment/Interview: Yes  Patient Presentation: Alert  Reason for Admission (Per Patient): Suicidal Ideation  Patient Stressors: Other (Comment)(Depression)  Coping Skills:   Isolation, Journal, Sports, Music, Exercise, Meditate, Deep Breathing, Talk, Prayer, Avoidance, Hot Bath/Shower  Leisure Interests (2+):  Sports - Basketball, Music - Listen  Frequency of Recreation/Participation: Other (Comment)(Daily)  Awareness of Community Resources:  No  Expressed Interest in [place]: No  County of Residence:  [place]  Patient Main Form of Transportation: Car  Patient Strengths:  Nice, Friendly  Patient Identified Areas of Improvement:  Not sure  Patient Goal for Hospitalization:  "Get better"  Current SI (including self-harm):  No  Current HI:  No  Current AVH: Yes(Rated a 9, hearing voices telling him to kill himself)  Staff Intervention Plan: Collaborate with Interdisciplinary Treatment Team, Group Attendance  Consent to Intern Participation: N/A    Victorino Sparrow, LRT/CTRS  Victorino Sparrow A 05/15/2018, 12:46 PM

## 2018-05-15 NOTE — Progress Notes (Signed)
Patient ID: [name], male   DOB: 04/06/1961, 57 y.o.   MRN: [id number] PER STATE REGULATIONS 482.30  THIS CHART WAS REVIEWED FOR MEDICAL NECESSITY WITH RESPECT TO THE PATIENT'S ADMISSION/DURATION OF STAY.  NEXT REVIEW DATE: 05/19/18  Roma Schanz, RN, BSN CASE MANAGER

## 2018-05-15 NOTE — H&P (Signed)
Psychiatric Admission Assessment Adult  Patient Identification: [name] MRN:  [id number] Date of Evaluation:  05/15/2018 Chief Complaint:  bipolar 1 depressed without psychosis Principal Diagnosis: <principal problem not specified> Diagnosis:  Active Problems:   MDD (major depressive disorder), recurrent, severe, with psychosis ([place])  History of Present Illness:  Mr. [name] is 57 years of age he has a history of being diagnosed with recurrent depression, possible bipolar depression, type 1 diabetes, essential hypertension, history of congestive heart failure and has been a group home resident.  He is at baseline incontinent and wears diapers however this is probably behavioral more than medical.  At any rate he presented voluntarily seeking help for suicidal thinking reporting that he is having voices inside his head telling him to harm himself though he did not elaborate a plan to harm himself. Denies alcohol and substance abuse family history significant in the reports his sister committed suicide May of this year and his mother died approximately a year ago and these are other sources of depression for him  Beyond this information nothing new was provided he is minimally talkative and does not elaborate on hallucinations today denies visual hallucinations  Associated Signs/Symptoms: Depression Symptoms:  anhedonia, (Hypo) Manic Symptoms:  Hallucinations, Anxiety Symptoms:  Excessive Worry, Psychotic Symptoms:  Hallucinations: Auditory PTSD Symptoms: NA Total Time spent with patient: 45 minutes  Past Psychiatric History: Believes Cymbalta was helpful for him at one point does not believe that Lyrica or Cymbalta have  increased suicidal thinking  Is the patient at risk to self? Yes.    Has the patient been a risk to self in the past 6 months? Yes.    Has the patient been a risk to self within the distant past? Yes.    Is the patient a risk to others? No.  Has the patient been  a risk to others in the past 6 months? No.  Has the patient been a risk to others within the distant past? No.   Alcohol Screening: 1. How often do you have a drink containing alcohol?: Never 2. How many drinks containing alcohol do you have on a typical day when you are drinking?: 1 or 2 3. How often do you have six or more drinks on one occasion?: Never AUDIT-C Score: 0 4. How often during the last year have you found that you were not able to stop drinking once you had started?: Never 5. How often during the last year have you failed to do what was normally expected from you becasue of drinking?: Never 6. How often during the last year have you needed a first drink in the morning to get yourself going after a heavy drinking session?: Never 7. How often during the last year have you had a feeling of guilt of remorse after drinking?: Never 8. How often during the last year have you been unable to remember what happened the night before because you had been drinking?: Never 9. Have you or someone else been injured as a result of your drinking?: No 10. Has a relative or friend or a doctor or another health worker been concerned about your drinking or suggested you cut down?: No Alcohol Use Disorder Identification Test Final Score (AUDIT): 0 Substance Abuse History in the last 12 months:  No. Consequences of Substance Abuse: Negative Previous Psychotropic Medications: Yes  Psychological Evaluations: No  Past Medical History:  Past Medical History:  Diagnosis Date  . Anxiety   . Bipolar disorder ([place])   .  Chronic pain   . Depression   . Diabetes mellitus without complication ([place])   . Diverticulitis   . GERD (gastroesophageal reflux disease)   . Hypertension   . IBS (irritable bowel syndrome)   . Kidney stone   . Migraine headache   . Neuropathy     Past Surgical History:  Procedure Laterality Date  . ANKLE SURGERY Right   . CARPAL TUNNEL RELEASE    . CHOLECYSTECTOMY    .  COLONOSCOPY     Family History:  Family History  Problem Relation Age of Onset  . CAD Mother   . CAD Brother   As mentioned suicide of sister in May 2019 Family Psychiatric  History:  Tobacco Screening:   Social History:  Social History   Substance and Sexual Activity  Alcohol Use No     Social History   Substance and Sexual Activity  Drug Use Not Currently    Additional Social History:                           Allergies:   Allergies  Allergen Reactions  . Meloxicam Other (See Comments)    Acid Reflux   . Venlafaxine Other (See Comments)    Acid Reflux  . Penicillins Rash    Has patient had a PCN reaction causing immediate rash, facial/tongue/throat swelling, SOB or lightheadedness with hypotension: Yes Has patient had a PCN reaction causing severe rash involving mucus membranes or skin necrosis: No Has patient had a PCN reaction that required hospitalization: No Has patient had a PCN reaction occurring within the last 10 years: No If all of the above answers are "NO", then may proceed with Cephalosporin use.    Lab Results:  Results for orders placed or performed during the hospital encounter of 05/15/18 (from the past 48 hour(s))  Glucose, capillary     Status: Abnormal   Collection Time: 05/15/18  4:49 AM  Result Value Ref Range   Glucose-Capillary 229 (H) 70 - 99 mg/dL   Comment 1 Notify RN    Comment 2 Document in Chart     Blood Alcohol level:  Lab Results  Component Value Date   ETH <10 05/14/2018   ETH <10 34/74/2595    Metabolic Disorder Labs:  Lab Results  Component Value Date   HGBA1C 9.7 (H) 03/13/2018   MPG 231.69 03/13/2018   MPG 191.51 10/05/2017   No results found for: PROLACTIN No results found for: CHOL, TRIG, HDL, CHOLHDL, VLDL, LDLCALC  Current Medications: Current Facility-Administered Medications  Medication Dose Route Frequency Provider Last Rate Last Dose  . acetaminophen (TYLENOL) tablet 650 mg  650 mg Oral Q6H  PRN Laverle Hobby, PA-C      . alum & mag hydroxide-simeth (MAALOX/MYLANTA) 200-200-20 MG/5ML suspension 30 mL  30 mL Oral Q4H PRN Laverle Hobby, PA-C      . amLODipine (NORVASC) tablet 5 mg  5 mg Oral Daily Johnn Hai, MD      . busPIRone (BUSPAR) tablet 15 mg  15 mg Oral TID Johnn Hai, MD      . DULoxetine (CYMBALTA) DR capsule 60 mg  60 mg Oral BID Johnn Hai, MD      . hydrOXYzine (ATARAX/VISTARIL) tablet 25 mg  25 mg Oral Q6H PRN Patriciaann Clan E, PA-C      . insulin aspart (novoLOG) injection 16 Units  16 Units Subcutaneous TID WC Johnn Hai, MD      .  insulin glargine (LANTUS) injection 35 Units  35 Units Subcutaneous Daily Johnn Hai, MD      . OLANZapine zydis (ZYPREXA) disintegrating tablet 5 mg  5 mg Oral Q8H PRN Laverle Hobby, PA-C       And  . LORazepam (ATIVAN) tablet 1 mg  1 mg Oral PRN Patriciaann Clan E, PA-C       And  . ziprasidone (GEODON) injection 20 mg  20 mg Intramuscular PRN Patriciaann Clan E, PA-C      . magnesium hydroxide (MILK OF MAGNESIA) suspension 30 mL  30 mL Oral Daily PRN Laverle Hobby, PA-C      . metoprolol tartrate (LOPRESSOR) tablet 50 mg  50 mg Oral BID Johnn Hai, MD      . pantoprazole (PROTONIX) EC tablet 40 mg  40 mg Oral BID Johnn Hai, MD      . pregabalin (LYRICA) capsule 200 mg  200 mg Oral BID Johnn Hai, MD      . traZODone (DESYREL) tablet 50 mg  50 mg Oral QHS,MR X 1 Simon, Spencer E, PA-C       PTA Medications: Medications Prior to Admission  Medication Sig Dispense Refill Last Dose  . amLODipine (NORVASC) 5 MG tablet Take 1 tablet (5 mg total) by mouth daily. 30 tablet 0 Past Week at Unknown time  . DULoxetine (CYMBALTA) 60 MG capsule Take 60 mg by mouth daily.   Past Week at Unknown time  . hydrOXYzine (ATARAX/VISTARIL) 50 MG tablet Take 50 mg by mouth 2 (two) times daily as needed for anxiety.   Past Week at Unknown time  . insulin aspart (NOVOLOG) 100 UNIT/ML injection Inject 16 Units into the skin 3 (three)  times daily.    05/14/2018 at Unknown time  . insulin glargine (LANTUS) 100 UNIT/ML injection Inject 35 Units into the skin daily before breakfast.   05/14/2018 at Unknown time  . lipase/protease/amylase (CREON) 36000 UNITS CPEP capsule Take 36,000 Units by mouth 3 (three) times daily with meals.   05/14/2018 at Unknown time  . metoprolol tartrate (LOPRESSOR) 50 MG tablet Take 25 mg by mouth 2 (two) times daily.   Past Week at Unknown time  . pantoprazole (PROTONIX) 40 MG tablet Take 1 tablet (40 mg total) by mouth daily. 30 tablet 0 Past Week at Unknown time  . pregabalin (LYRICA) 200 MG capsule Take 200 mg by mouth 2 (two) times daily.    Past Week at Unknown time  . ondansetron (ZOFRAN ODT) 4 MG disintegrating tablet Take 1 tablet (4 mg total) by mouth every 8 (eight) hours as needed for nausea or vomiting. 20 tablet 0     Musculoskeletal: Strength & Muscle Tone: within normal limits Gait & Station: normal Patient leans: N/A  Psychiatric Specialty Exam: Physical Exam  ROS  Blood pressure (!) 163/93, pulse 80, temperature 97.6 F (36.4 C), temperature source Oral, resp. rate 16, height 5' 10.5" (1.791 m), weight 85.3 kg.Body mass index is 26.59 kg/m.  General Appearance: Casual  Eye Contact:  Good  Speech:  Clear and Coherent  Volume:  Decreased  Mood:  Anxious and Depressed  Affect:  Appropriate  Thought Process:  Descriptions of Associations: Tangential  Orientation:  Full (Time, Place, and Person)  Thought Content:  Logical and Hallucinations: Auditory  Suicidal Thoughts:  Yes-no current plans or intent contracting here  Homicidal Thoughts:  No  Memory:  Immediate;   Fair  Judgement:  Fair  Insight:  fair  Psychomotor Activity:  Normal  Concentration:  Concentration: Good  Recall:  Good  Fund of Knowledge:  Good  Language:  Good  Akathisia:  Negative  Handed:  Right  AIMS (if indicated):     Assets:  Desire for Improvement  ADL's:  Intact  Cognition:  WNL  Sleep:   Number of Hours: 0(new admit )    Treatment Plan Summary: Daily contact with patient to assess and evaluate symptoms and progress in treatment and Medication management  Observation Level/Precautions:  15 minute checks  Laboratory:  UDS  Psychotherapy: Cognitive  Medications: Escalate Cymbalta  Consultations: Not needed  Discharge Concerns: Long-term safety  Estimated LOS: 5 days  Other: 15-minute precautions   Physician Treatment Plan for Primary Diagnosis: <principal problem not specified> Long Term Goal(s): Improvement in symptoms so as ready for discharge  Short Term Goals: Ability to identify changes in lifestyle to reduce recurrence of condition will improve, Ability to verbalize feelings will improve and Ability to disclose and discuss suicidal ideas  Physician Treatment Plan for Secondary Diagnosis: Active Problems:   MDD (major depressive disorder), recurrent, severe, with psychosis ([place])  Long Term Goal(s): Improvement in symptoms so as ready for discharge  Short Term Goals: Ability to identify and develop effective coping behaviors will improve and Ability to maintain clinical measurements within normal limits will improve  I certify that inpatient services furnished can reasonably be expected to improve the patient's condition.    Johnn Hai, MD 12/27/20199:27 AM

## 2018-05-15 NOTE — BHH Counselor (Signed)
CSW attempted to complete PSA with pt. However, pt was sleep in his room. Writer knocked on the door and was unable to wake him up.   Lorriane Dehart S. [place], [place], MSW [facility]: Child and Adolescent  [phone]

## 2018-05-15 NOTE — BHH Suicide Risk Assessment (Signed)
[facility] Admission Suicide Risk Assessment   Nursing information obtained from:    Demographic factors:  Male, Unemployed Current Mental Status:  Suicidal ideation indicated by patient Loss Factors:  Loss of significant relationship Historical Factors:  Family history of suicide Risk Reduction Factors:  Positive social support  Total Time spent with patient: 45 minutes Principal Problem: <principal problem not specified> Diagnosis:  Active Problems:   MDD (major depressive disorder), recurrent, severe, with psychosis ([place])  Subjective Data:  Patient admitted voluntarily endorsing suicidal thoughts reporting recent losses and auditory hallucinations described as internal see the admission note  Continued Clinical Symptoms:  Alcohol Use Disorder Identification Test Final Score (AUDIT): 0 The "Alcohol Use Disorders Identification Test", Guidelines for Use in Primary Care, Second Edition.  World Pharmacologist [facility]). Score between 0-7:  no or low risk or alcohol related problems. Score between 8-15:  moderate risk of alcohol related problems. Score between 16-19:  high risk of alcohol related problems. Score 20 or above:  warrants further diagnostic evaluation for alcohol dependence and treatment.   CLINICAL FACTORS:   Depression:   Severe       COGNITIVE FEATURES THAT CONTRIBUTE TO RISK:  Thought constriction (tunnel vision)    SUICIDE RISK:   Minimal: No identifiable suicidal ideation.  Patients presenting with no risk factors but with morbid ruminations; may be classified as minimal risk based on the severity of the depressive symptoms  PLAN OF CARE: Escalate Cymbalta continue cognitive therapy and current precautions contracting here  I certify that inpatient services furnished can reasonably be expected to improve the patient's condition.   Johnn Hai, MD 05/15/2018, 9:25 AM

## 2018-05-15 NOTE — Progress Notes (Signed)
Admission Note:  57 yr male who presents VC in no acute distress for the treatment of SI , psychosis and Depression. Pt appears flat and depressed. Pt was calm and cooperative with admission process. Pt presents with passive SI  / AH  ( x 1 mth) and contracts for safety upon admission. Pt denies VH . Pt stated  His sister committed suicide in 2017/10/22 and mom died 1 yr ago. Pt CBG -229 on admission. Pt stated he was incontinent and wears depends , pt has diabetic wound on L-heel for past few months that [facility] has been taking care .   A:Skin was assessed and found to be clear of any abnormal marks apart from diabetic ulcer L-heel (foot was bandaged- clean dry intact) . PT searched and no contraband found, POC and unit policies explained and understanding verbalized. Consents obtained. Food and fluids offered, and  Accepted.  R: Pt had no additional questions or concerns.

## 2018-05-15 NOTE — Progress Notes (Addendum)
Pt was observed resting in bed with eyes open. Pt appears flat/anxious in affect and mood. Pt denies HI/Pain at this time. Endorses SI but verbal contracts for safety. Endorses AVH but does not elaborate. Pt was guarded/cautious with interaction.PRN vistaril requested and given for increasing anxiety. Pt would benefit from wound consult regarding diabetic ulcer on left heel. Poor hygiene and body odor noted; Pt was provided with supplies and new gown. Support and encouragement provided. Will continue with POC.

## 2018-05-16 DIAGNOSIS — F419 Anxiety disorder, unspecified: Secondary | ICD-10-CM

## 2018-05-16 DIAGNOSIS — R45851 Suicidal ideations: Secondary | ICD-10-CM

## 2018-05-16 DIAGNOSIS — G4700 Insomnia, unspecified: Secondary | ICD-10-CM

## 2018-05-16 DIAGNOSIS — F332 Major depressive disorder, recurrent severe without psychotic features: Secondary | ICD-10-CM

## 2018-05-16 LAB — GLUCOSE, CAPILLARY: Glucose-Capillary: 262 mg/dL — ABNORMAL HIGH (ref 70–99)

## 2018-05-16 NOTE — Plan of Care (Signed)
Progress note  D: pt found in bed; compliant with medication administration. Pt states he slept fair. Pt rates his depression/hopelessness/anxiety a 01/27/09 out of 10 respectively. Pt states he is having neuropathic pain that he rates at a 9/10. Pt declined medication for this. Pt states he is having passive si but denies a plan at this time and verbally agrees to approach staff if a plan become apparent or before harming himself while at [facility]. Pt states his goal for today is to get better today and he will achieve this by praying. Pt denies any hi/ah/vh. A: pt provided support and encouragement. Pt given medication per protocol and standing orders. Q26m safety checks implemented and continued. R: pt safe on the unit. Will continue to monitor.   Pt progressing in the following metrics  Problem: Education: Goal: Knowledge of [place] [facility] Education information/materials will improve Outcome: Progressing Goal: Emotional status will improve Outcome: Progressing Goal: Mental status will improve Outcome: Progressing Goal: Verbalization of understanding the information provided will improve Outcome: Progressing
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
## 2018-05-16 NOTE — Progress Notes (Addendum)
[facility] MD Progress Note  05/16/2018 10:29 AM [name]  MRN:  [id number]   Subjective: Patient reports today that he is still feeling very depressed and is still having suicidal ideations without a plan.  He reports that he is still having auditory hallucinations that are telling him to kill himself or end his life.  He denies any intent or plan to harm himself however.  He reports that he follows up at the [place] in [place] and sees them on a regular basis.  He states that a lot of his stressors are due to his brother passing away 10 years ago and his mother passed away approximately a year and a half ago.  He reports that he has been on's medications for years but feels that possibly increasing his doses would potentially help him.  He denies any medication side effects at this time.  He reports sleeping well and having a decent appetite as well.  Objective: Patient's chart and findings reviewed and discussed with treatment team.  Patient presents lying in his bed and appears depressed makes minimal eye contact.  He is awake but communicates easily.  Patient is pleasant, calm, and cooperative during the interview and there have been no complaints about the patient on the unit.   Principal Problem: MDD (major depressive disorder), recurrent, severe, with psychosis ([place]) Diagnosis: Principal Problem:   MDD (major depressive disorder), recurrent, severe, with psychosis ([place])  Total Time spent with patient: 20 minutes  Past Psychiatric History: See H&P  Past Medical History:  Past Medical History:  Diagnosis Date  . Anxiety   . Bipolar disorder ([place])   . Chronic pain   . Depression   . Diabetes mellitus without complication ([place])   . Diverticulitis   . GERD (gastroesophageal reflux disease)   . Hypertension   . IBS (irritable bowel syndrome)   . Kidney stone   . Migraine headache   . Neuropathy     Past Surgical History:  Procedure Laterality Date  . ANKLE SURGERY Right   .  CARPAL TUNNEL RELEASE    . CHOLECYSTECTOMY    . COLONOSCOPY     Family History:  Family History  Problem Relation Age of Onset  . CAD Mother   . CAD Brother    Family Psychiatric  History: See H&P Social History:  Social History   Substance and Sexual Activity  Alcohol Use No     Social History   Substance and Sexual Activity  Drug Use Not Currently    Social History   Socioeconomic History  . Marital status: Single    Spouse name: Not on file  . Number of children: Not on file  . Years of education: Not on file  . Highest education level: Not on file  Occupational History  . Not on file  Social Needs  . Financial resource strain: Not on file  . Food insecurity:    Worry: Not on file    Inability: Not on file  . Transportation needs:    Medical: Not on file    Non-medical: Not on file  Tobacco Use  . Smoking status: Never Smoker  . Smokeless tobacco: Never Used  Substance and Sexual Activity  . Alcohol use: No  . Drug use: Not Currently  . Sexual activity: Not on file  Lifestyle  . Physical activity:    Days per week: Not on file    Minutes per session: Not on file  . Stress: Not on file  Relationships  . Social connections:    Talks on phone: Not on file    Gets together: Not on file    Attends religious service: Not on file    Active member of club or organization: Not on file    Attends meetings of clubs or organizations: Not on file    Relationship status: Not on file  Other Topics Concern  . Not on file  Social History Narrative  . Not on file   Additional Social History:                         Sleep: Good  Appetite:  Good  Current Medications: Current Facility-Administered Medications  Medication Dose Route Frequency Provider Last Rate Last Dose  . acetaminophen (TYLENOL) tablet 650 mg  650 mg Oral Q6H PRN Laverle Hobby, PA-C      . alum & mag hydroxide-simeth (MAALOX/MYLANTA) 200-200-20 MG/5ML suspension 30 mL  30 mL  Oral Q4H PRN Patriciaann Clan E, PA-C      . amLODipine (NORVASC) tablet 5 mg  5 mg Oral Daily Johnn Hai, MD   5 mg at 05/16/18 0750  . busPIRone (BUSPAR) tablet 15 mg  15 mg Oral TID Johnn Hai, MD   15 mg at 05/16/18 0751  . DULoxetine (CYMBALTA) DR capsule 60 mg  60 mg Oral BID Johnn Hai, MD   60 mg at 05/16/18 0750  . hydrOXYzine (ATARAX/VISTARIL) tablet 25 mg  25 mg Oral Q6H PRN Laverle Hobby, PA-C   25 mg at 05/15/18 2109  . insulin aspart (novoLOG) injection 16 Units  16 Units Subcutaneous TID WC Johnn Hai, MD   16 Units at 05/16/18 [phone]  . insulin glargine (LANTUS) injection 35 Units  35 Units Subcutaneous Daily Johnn Hai, MD   35 Units at 05/16/18 0751  . OLANZapine zydis (ZYPREXA) disintegrating tablet 5 mg  5 mg Oral Q8H PRN Laverle Hobby, PA-C       And  . LORazepam (ATIVAN) tablet 1 mg  1 mg Oral PRN Patriciaann Clan E, PA-C       And  . ziprasidone (GEODON) injection 20 mg  20 mg Intramuscular PRN Patriciaann Clan E, PA-C      . magnesium hydroxide (MILK OF MAGNESIA) suspension 30 mL  30 mL Oral Daily PRN Patriciaann Clan E, PA-C      . metoprolol tartrate (LOPRESSOR) tablet 50 mg  50 mg Oral BID Johnn Hai, MD   50 mg at 05/16/18 0750  . pantoprazole (PROTONIX) EC tablet 40 mg  40 mg Oral BID Johnn Hai, MD   40 mg at 05/16/18 0751  . pregabalin (LYRICA) capsule 200 mg  200 mg Oral BID Johnn Hai, MD   200 mg at 05/16/18 0750  . traZODone (DESYREL) tablet 50 mg  50 mg Oral QHS,MR X 1 Laverle Hobby, PA-C   50 mg at 05/15/18 2110    Lab Results:  Results for orders placed or performed during the hospital encounter of 05/15/18 (from the past 48 hour(s))  Glucose, capillary     Status: Abnormal   Collection Time: 05/15/18  4:49 AM  Result Value Ref Range   Glucose-Capillary 229 (H) 70 - 99 mg/dL   Comment 1 Notify RN    Comment 2 Document in Chart   Glucose, capillary     Status: Abnormal   Collection Time: 05/16/18  5:49 AM  Result Value Ref Range    Glucose-Capillary 262 (  H) 70 - 99 mg/dL    Blood Alcohol level:  Lab Results  Component Value Date   ETH <10 05/14/2018   ETH <10 81/05/7508    Metabolic Disorder Labs: Lab Results  Component Value Date   HGBA1C 9.7 (H) 03/13/2018   MPG 231.69 03/13/2018   MPG 191.51 10/05/2017   No results found for: PROLACTIN No results found for: CHOL, TRIG, HDL, CHOLHDL, VLDL, LDLCALC  Physical Findings: AIMS: Facial and Oral Movements Muscles of Facial Expression: None, normal Lips and Perioral Area: None, normal Jaw: None, normal Tongue: None, normal,Extremity Movements Upper (arms, wrists, hands, fingers): None, normal Lower (legs, knees, ankles, toes): None, normal, Trunk Movements Neck, shoulders, hips: None, normal, Overall Severity Severity of abnormal movements (highest score from questions above): None, normal Incapacitation due to abnormal movements: None, normal Patient's awareness of abnormal movements (rate only patient's report): No Awareness, Dental Status Current problems with teeth and/or dentures?: No Does patient usually wear dentures?: No  CIWA:    COWS:     Musculoskeletal: Strength & Muscle Tone: within normal limits Gait & Station: normal Patient leans: N/A  Psychiatric Specialty Exam: Physical Exam  Nursing note and vitals reviewed. Constitutional: He is oriented to person, place, and time. He appears well-developed and well-nourished.  Cardiovascular: Normal rate.  Respiratory: Effort normal.  Musculoskeletal: Normal range of motion.  Neurological: He is alert and oriented to person, place, and time.  Skin: Skin is warm.    Review of Systems  Constitutional: Negative.   HENT: Negative.   Eyes: Negative.   Respiratory: Negative.   Cardiovascular: Negative.   Gastrointestinal: Negative.   Genitourinary: Negative.   Musculoskeletal: Negative.   Skin: Negative.   Neurological: Negative.   Endo/Heme/Allergies: Negative.   Psychiatric/Behavioral:  Positive for depression, hallucinations and suicidal ideas.    Blood pressure 95/62, pulse 85, temperature 97.7 F (36.5 C), temperature source Oral, resp. rate 18, height 5' 10.5" (1.791 m), weight 85.3 kg.Body mass index is 26.59 kg/m.  General Appearance: Disheveled  Eye Contact:  Minimal  Speech:  Clear and Coherent and Normal Rate  Volume:  Decreased  Mood:  Depressed  Affect:  Flat  Thought Process:  Coherent and Descriptions of Associations: Intact  Orientation:  Full (Time, Place, and Person)  Thought Content:  WDL  Suicidal Thoughts:  Yes.  without intent/plan  Homicidal Thoughts:  No  Memory:  Immediate;   Good Recent;   Good Remote;   Good  Judgement:  Fair  Insight:  Fair  Psychomotor Activity:  Normal  Concentration:  Concentration: Good and Attention Span: Good  Recall:  Good  Fund of Knowledge:  Good  Language:  Good  Akathisia:  No  Handed:  Right  AIMS (if indicated):     Assets:  Communication Skills Desire for Improvement Physical Health Social Support Transportation  ADL's:  Intact  Cognition:  WNL  Sleep:  Number of Hours: 6.75   Problems addressed MDD recurrent severe with psychosis  Treatment Plan Summary: Daily contact with patient to assess and evaluate symptoms and progress in treatment, Medication management and Plan is to: Continue BuSpar 15 mg p.o. 3 times daily for anxiety and mood stability Continue Cymbalta 60 mg p.o. twice daily for mood stability Continue Vistaril 25 mg p.o. every 6 hours as needed for anxiety Continue trazodone 50 mg p.o. nightly as needed for insomnia Encourage group therapy participation  Lewis Shock, FNP 05/16/2018, 10:29 AM   .Agree with NP Progress Note

## 2018-05-16 NOTE — BHH Group Notes (Signed)
[place] Group Notes: (Clinical Social Work)   05/16/2018      Type of Therapy:  Group Therapy   Participation Level:  Did Not Attend despite MHT prompting   Selmer Dominion, LCSW 05/16/2018, 12:23 PM

## 2018-05-17 LAB — GLUCOSE, CAPILLARY
Glucose-Capillary: 266 mg/dL — ABNORMAL HIGH (ref 70–99)
Glucose-Capillary: 381 mg/dL — ABNORMAL HIGH (ref 70–99)
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
## 2018-05-17 NOTE — Progress Notes (Signed)
Nursing note 7p-7a  Pt observed isolating in room this shift. Displayed a flat affect and depressed mood upon interaction with this Probation officer. Pt denies pain ,denies SI/HI, and also denies any  visual hallucinations at this time.Pt malodorous encouraged to shower. Pt endorsed AH stated " the voices tell me to hurt myself- Im just sick"  Pt complains of anxiety, see MAR for prn medication administration. Pt is able to verbally contract for safety with this RN. Pt would not allow this RN to look at Left heel wound. Stated "the bandage is in on".  Pt educated on falls and encouraged to wear nonskid socks when ambulating in the halls. Pt also encouraged to call for help if feeling weak or dizzy. Pt verbalized understanding of all education provided. Pt is now resting in bed with eyes closed, with no signs or symptoms of pain or distress noted. Pt continues to remain safe on the unit and is observed by rounding every 15 min. RN will continue to monitor.

## 2018-05-17 NOTE — BHH Group Notes (Signed)
[place] LCSW Group Therapy Note  Date/Time:  05/17/2018  11:00AM-12:00PM  Type of Therapy and Topic:  Group Therapy:  Music and Mood  Participation Level:  Did Not Attend   Description of Group: In this process group, members listened to a variety of genres of music and identified that different types of music evoke different responses.  Patients were encouraged to identify music that was soothing for them and music that was energizing for them.  Patients discussed how this knowledge can help with wellness and recovery in various ways including managing depression and anxiety as well as encouraging healthy sleep habits.    Therapeutic Goals: 1. Patients will explore the impact of different varieties of music on mood 2. Patients will verbalize the thoughts they have when listening to different types of music 3. Patients will identify music that is soothing to them as well as music that is energizing to them 4. Patients will discuss how to use this knowledge to assist in maintaining wellness and recovery 5. Patients will explore the use of music as a coping skill  Summary of Patient Progress:  N/A  Therapeutic Modalities: Solution Focused Brief Therapy Activity   Selmer Dominion, LCSW

## 2018-05-17 NOTE — Progress Notes (Signed)
BP low this am 87/61 standing Gatoraide given will reassess

## 2018-05-17 NOTE — Plan of Care (Signed)
  Problem: Activity: Goal: Interest or engagement in activities will improve Outcome: Not Progressing   Problem: Safety: Goal: Periods of time without injury will increase Outcome: Progressing   D: Pt alert and oriented on the unit. Pt denies SI/HI, VH, but endorses AH. Pt's affect was flat and mood depressed. Pt was isolative to his room for most of the day and did not attend any groups. Pt is pleasant and cooperative. A: Education, support and encouragement provided, q15 minute safety checks remain in effect. Medications administered per MD orders. R: No reactions/side effects to medicine noted. Pt denies any concerns at this time, and verbally contracts for safety. Pt ambulating on the unit with no issues. Pt remains safe on and off the unit.

## 2018-05-17 NOTE — Progress Notes (Addendum)
[facility] MD Progress Note  05/17/2018 4:35 PM [name]  MRN:  [id number]   Subjective:  My stomach hurts and I dont feel good. I take phenergan at home.   Objective: Patient's chart and findings reviewed and discussed with treatment team. 57 year old male who was diagnosed with recurrent depression, and resides in an adult living facility. He presents with reports of increasing suicidal ideations and auditory hallucinations. Patient is observed lying in bed with no eye contact at this time. He is ruminating about his abdominal pain, yet reports having a chronic history. When attempting to address his mental illness and reasons for coming into the hospital, he denies any symptoms except hallucinations at this time. He does not appear to be responding to internal stimuli and or psychosis. He denies any suicidal ideation and is able to contract for safety while on the unit.    Principal Problem: MDD (major depressive disorder), recurrent, severe, with psychosis ([place]) Diagnosis: Principal Problem:   MDD (major depressive disorder), recurrent, severe, with psychosis ([place])  Total Time spent with patient: 20 minutes  Past Psychiatric History: See H&P  Past Medical History:  Past Medical History:  Diagnosis Date  . Anxiety   . Bipolar disorder ([place])   . Chronic pain   . Depression   . Diabetes mellitus without complication ([place])   . Diverticulitis   . GERD (gastroesophageal reflux disease)   . Hypertension   . IBS (irritable bowel syndrome)   . Kidney stone   . Migraine headache   . Neuropathy     Past Surgical History:  Procedure Laterality Date  . ANKLE SURGERY Right   . CARPAL TUNNEL RELEASE    . CHOLECYSTECTOMY    . COLONOSCOPY     Family History:  Family History  Problem Relation Age of Onset  . CAD Mother   . CAD Brother    Family Psychiatric  History: See H&P Social History:  Social History   Substance and Sexual Activity  Alcohol Use No     Social History    Substance and Sexual Activity  Drug Use Not Currently    Social History   Socioeconomic History  . Marital status: Single    Spouse name: Not on file  . Number of children: Not on file  . Years of education: Not on file  . Highest education level: Not on file  Occupational History  . Not on file  Social Needs  . Financial resource strain: Not on file  . Food insecurity:    Worry: Not on file    Inability: Not on file  . Transportation needs:    Medical: Not on file    Non-medical: Not on file  Tobacco Use  . Smoking status: Never Smoker  . Smokeless tobacco: Never Used  Substance and Sexual Activity  . Alcohol use: No  . Drug use: Not Currently  . Sexual activity: Not on file  Lifestyle  . Physical activity:    Days per week: Not on file    Minutes per session: Not on file  . Stress: Not on file  Relationships  . Social connections:    Talks on phone: Not on file    Gets together: Not on file    Attends religious service: Not on file    Active member of club or organization: Not on file    Attends meetings of clubs or organizations: Not on file    Relationship status: Not on file  Other Topics  Concern  . Not on file  Social History Narrative  . Not on file   Additional Social History:        Sleep: Good  Appetite:  Good  Current Medications: Current Facility-Administered Medications  Medication Dose Route Frequency Provider Last Rate Last Dose  . acetaminophen (TYLENOL) tablet 650 mg  650 mg Oral Q6H PRN Laverle Hobby, PA-C      . alum & mag hydroxide-simeth (MAALOX/MYLANTA) 200-200-20 MG/5ML suspension 30 mL  30 mL Oral Q4H PRN Patriciaann Clan E, PA-C      . amLODipine (NORVASC) tablet 5 mg  5 mg Oral Daily Johnn Hai, MD   5 mg at 05/17/18 0804  . busPIRone (BUSPAR) tablet 15 mg  15 mg Oral TID Johnn Hai, MD   15 mg at 05/17/18 1209  . DULoxetine (CYMBALTA) DR capsule 60 mg  60 mg Oral BID Johnn Hai, MD   60 mg at 05/17/18 0804  .  hydrOXYzine (ATARAX/VISTARIL) tablet 25 mg  25 mg Oral Q6H PRN Laverle Hobby, PA-C   25 mg at 05/16/18 2046  . insulin aspart (novoLOG) injection 16 Units  16 Units Subcutaneous TID WC Johnn Hai, MD   16 Units at 05/17/18 1211  . insulin glargine (LANTUS) injection 35 Units  35 Units Subcutaneous Daily Johnn Hai, MD   35 Units at 05/17/18 0815  . OLANZapine zydis (ZYPREXA) disintegrating tablet 5 mg  5 mg Oral Q8H PRN Laverle Hobby, PA-C       And  . LORazepam (ATIVAN) tablet 1 mg  1 mg Oral PRN Patriciaann Clan E, PA-C       And  . ziprasidone (GEODON) injection 20 mg  20 mg Intramuscular PRN Patriciaann Clan E, PA-C      . magnesium hydroxide (MILK OF MAGNESIA) suspension 30 mL  30 mL Oral Daily PRN Patriciaann Clan E, PA-C      . metoprolol tartrate (LOPRESSOR) tablet 50 mg  50 mg Oral BID Johnn Hai, MD   Stopped at 05/17/18 [phone]  . pantoprazole (PROTONIX) EC tablet 40 mg  40 mg Oral BID Johnn Hai, MD   40 mg at 05/17/18 0804  . pregabalin (LYRICA) capsule 200 mg  200 mg Oral BID Johnn Hai, MD   200 mg at 05/17/18 0804  . traZODone (DESYREL) tablet 50 mg  50 mg Oral QHS,MR X 1 Laverle Hobby, PA-C   50 mg at 05/16/18 2045    Lab Results:  Results for orders placed or performed during the hospital encounter of 05/15/18 (from the past 48 hour(s))  Glucose, capillary     Status: Abnormal   Collection Time: 05/16/18  5:49 AM  Result Value Ref Range   Glucose-Capillary 262 (H) 70 - 99 mg/dL  Glucose, capillary     Status: Abnormal   Collection Time: 05/17/18  6:04 AM  Result Value Ref Range   Glucose-Capillary 266 (H) 70 - 99 mg/dL   Comment 1 Notify RN   Glucose, capillary     Status: Abnormal   Collection Time: 05/17/18  8:10 AM  Result Value Ref Range   Glucose-Capillary 381 (H) 70 - 99 mg/dL    Blood Alcohol level:  Lab Results  Component Value Date   ETH <10 05/14/2018   ETH <10 61/60/7371    Metabolic Disorder Labs: Lab Results  Component Value Date    HGBA1C 9.7 (H) 03/13/2018   MPG 231.69 03/13/2018   MPG 191.51 10/05/2017   No results found  for: PROLACTIN No results found for: CHOL, TRIG, HDL, CHOLHDL, VLDL, LDLCALC  Physical Findings: AIMS: Facial and Oral Movements Muscles of Facial Expression: None, normal Lips and Perioral Area: None, normal Jaw: None, normal Tongue: None, normal,Extremity Movements Upper (arms, wrists, hands, fingers): None, normal Lower (legs, knees, ankles, toes): None, normal, Trunk Movements Neck, shoulders, hips: None, normal, Overall Severity Severity of abnormal movements (highest score from questions above): None, normal Incapacitation due to abnormal movements: None, normal Patient's awareness of abnormal movements (rate only patient's report): No Awareness, Dental Status Current problems with teeth and/or dentures?: No Does patient usually wear dentures?: No  CIWA:    COWS:     Musculoskeletal: Strength & Muscle Tone: within normal limits Gait & Station: normal Patient leans: N/A  Psychiatric Specialty Exam: Physical Exam  Nursing note and vitals reviewed. Constitutional: He is oriented to person, place, and time. He appears well-developed and well-nourished.  Cardiovascular: Normal rate.  Respiratory: Effort normal.  Musculoskeletal: Normal range of motion.  Neurological: He is alert and oriented to person, place, and time.  Skin: Skin is warm.    Review of Systems  Constitutional: Negative.   HENT: Negative.   Eyes: Negative.   Respiratory: Negative.   Cardiovascular: Negative.   Gastrointestinal: Negative.   Genitourinary: Negative.   Musculoskeletal: Negative.   Skin: Negative.   Neurological: Negative.   Endo/Heme/Allergies: Negative.   Psychiatric/Behavioral: Positive for depression, hallucinations and suicidal ideas.    Blood pressure (!) 170/65, pulse 64, temperature 98.1 F (36.7 C), temperature source Oral, resp. rate 18, height 5' 10.5" (1.791 m), weight 85.3  kg.Body mass index is 26.59 kg/m.  General Appearance: Disheveled wearing paper scrubs  Eye Contact:  Minimal  Speech:  Clear and Coherent and Normal Rate  Volume:  Decreased  Mood:  Depressed  Affect:  Flat  Thought Process:  Coherent and Descriptions of Associations: Intact  Orientation:  Full (Time, Place, and Person)  Thought Content:  WDL  Suicidal Thoughts:  Yes.  without intent/plan  Homicidal Thoughts:  No  Memory:  Immediate;   Good Recent;   Good Remote;   Good  Judgement:  Fair  Insight:  Fair  Psychomotor Activity:  Normal  Concentration:  Concentration: Good and Attention Span: Good  Recall:  Good  Fund of Knowledge:  Good  Language:  Good  Akathisia:  No  Handed:  Right  AIMS (if indicated):     Assets:  Communication Skills Desire for Improvement Physical Health Social Support Transportation  ADL's:  Intact  Cognition:  WNL  Sleep:  Number of Hours: 6.75   Problems addressed MDD recurrent severe with psychosis  Treatment Plan Summary: Daily contact with patient to assess and evaluate symptoms and progress in treatment, Medication management and Plan is to: Continue BuSpar 15 mg p.o. 3 times daily for anxiety and mood stability Continue Cymbalta 60 mg p.o. twice daily for mood stability Continue Vistaril 25 mg p.o. every 6 hours as needed for anxiety Continue trazodone 50 mg p.o. nightly as needed for insomnia Encourage group therapy participation  Suella Broad, FNP 05/17/2018, 4:35 PM    ..Agree with NP Progress Note

## 2018-05-18 LAB — GLUCOSE, CAPILLARY
Glucose-Capillary: 219 mg/dL — ABNORMAL HIGH (ref 70–99)
Glucose-Capillary: 248 mg/dL — ABNORMAL HIGH (ref 70–99)
Glucose-Capillary: 272 mg/dL — ABNORMAL HIGH (ref 70–99)
Glucose-Capillary: 197 mg/dL — ABNORMAL HIGH (ref 70–99)

## 2018-05-18 NOTE — Progress Notes (Signed)
Recreation Therapy Notes  Date: 12.30.19 Time: 1015 Location: [address] Dayroom  Group Topic: Anger Management  Goal Area(s) Addresses:  Pt will identify things that cause them to get angry. Pt will identify their reaction to anger. Pt will identify things their anger has cost them. Pt will identify coping skills to dealing with anger.  Intervention: Worksheet  Activity: Introduction to Anger Management.  Patients were to identify the things that cause their anger, their reactions to anger, the things their anger has cost them and positive coping skills they can use when angry.  Education: Radiographer, therapeutic, Dentist.   Education Outcome: Acknowledges understanding/In group clarification offered/Needs additional education.   Clinical Observations/Feedback: Pt did not attend group.    Victorino Sparrow, LRT/CTRS         Ria Comment, Jamy Whyte A 05/18/2018 11:22 AM

## 2018-05-18 NOTE — Plan of Care (Signed)
  Problem: Activity: Goal: Sleeping patterns will improve Outcome: Progressing   Problem: Safety: Goal: Periods of time without injury will increase Outcome: Progressing   

## 2018-05-18 NOTE — [facility] Group Notes (Signed)
LCSW Group Therapy Notes    Type of Therapy and Topic: Group Therapy: Effective Communication - Criticism    Participation Level: None   Description of Group:  In this group patients will be asked to identify their own styles of communication as well as defining and identifying passive, assertive, and aggressive styles of communication. Participants will identify strategies to communicate in a more assertive manner in an effort to appropriately meet their needs. This group will be process-oriented, with patients participating in exploration of their own experiences as well as giving and receiving support and challenge from other group members.   Therapeutic Goals: 1. Patient will identify their personal communication style. 2. Patient will identify passive, assertive, and aggressive forms of communication. 3. Patient will identify strategies for developing more effective communication to appropriately meet their needs.    Summary of Patient Progress Did not attend.    Therapeutic Modalities:  Communication Skills Solution Focused Therapy Motivational [place], MSW, LCSWA

## 2018-05-18 NOTE — Progress Notes (Signed)
Although patient showered last night, he currently smells of feces and his scrubs indicate he has soiled himself. Presently at breakfast where peers are noticing and commenting. Foul odor in room throughout night. AC informed. No provider available however will ask day shift to obtain order for private room (unless this writer is able to first). Patient will be encouraged to complete hygiene upon his return from breakfast.

## 2018-05-18 NOTE — Progress Notes (Signed)
D: Patient observed isolative to room, bed much of evening. Patient states, "I'm sick, I'm sick." Upon further questioning patient states he is nauseated.  Patient's affect anxious, worried and preoccupied with congruent mood. Patient's hygiene remains quite poor with strong body odor which can be detected in room. Denies pain.   A: Patient instructed to take shower and was assisted by this writer who changed linens. Fresh paper scrubs given to patient. Medicated per orders, prn trazadone and vistaril given to promote sleep. Medication education provided. Level III obs in place for safety. Emotional support offered. Patient encouraged to complete Suicide Safety Plan before discharge. Encouraged to attend and participate in unit programming.  Fall prevention plan in place and reviewed with patient as pt is a high fall risk.   R: Patient verbalizes understanding of POC, falls prevention education. On reassess, patient is asleep. Patient denies HI but endorses chronic SI and command AH along with VH. Patient remains safe on level III obs. Will continue to monitor throughout the night.

## 2018-05-18 NOTE — Progress Notes (Signed)
CSW aware of plan to discharge patient tomorrow and that patient has been residing in [facility] in [place]. CSW called the phone number for the group home twice without answer [phone]). CSW contacted another group home owned by [facility] and was provided with the administrator's cell phone number.  CSW spoke with Rise Paganini [phone]). Rise Paganini states she spoke with patient's sister and she and the sister believe that the patient needs to be moved to a higher level of care for ADLs. CSW informed Rise Paganini that the patient is expected to discharge tomorrow morning and explained that placement, such as nursing homes or assisted living, is not coordinated from [facility]. CSW asked if patient is able to return to group home. Rise Paganini would not give a direct answer, states she will call patient's sister.  Rise Paganini stated if patient does return to her group home, she will need an FL-2. CSW familiar with FL-2 and PASRR and shared that [facility] is a psychiatric hospital- not medical, thus an FL-2 completed from [facility] would not include PT/OT recommendations or other information from an inpatient medical hospitalization. Beverly voiced understanding.  Rise Paganini states she will contact the patient's sister and CSW will call back this afternoon. CSW following for discharge needs.  Stephanie Acre, LCSW-A Clinical Social Worker

## 2018-05-18 NOTE — Plan of Care (Signed)
  Problem: Activity: Goal: Interest or engagement in activities will improve Outcome: Progressing Goal: Sleeping patterns will improve Outcome: Progressing   Problem: Coping: Goal: Ability to verbalize frustrations and anger appropriately will improve Outcome: Progressing Goal: Ability to demonstrate self-control will improve Outcome: Progressing   Problem: Education: Goal: Mental status will improve Outcome: Not Progressing

## 2018-05-18 NOTE — Progress Notes (Signed)
[facility] MD Progress Note  05/18/2018 10:18 AM [name]  MRN:  [id number] Subjective:   Patient continues to state he is not ready for release yet endorses no particular symptoms he has some orthostasis since we have added the beta-blocker we will discontinue that.  Due to lower IQ we are unable to make a lot of progress and cognitive based therapy.  No thoughts of harming self today contracting here  Principal Problem: MDD (major depressive disorder), recurrent, severe, with psychosis ([place]) Diagnosis: Principal Problem:   MDD (major depressive disorder), recurrent, severe, with psychosis ([place])  Total Time spent with patient: 20 minutes  Past Medical History:  Past Medical History:  Diagnosis Date  . Anxiety   . Bipolar disorder ([place])   . Chronic pain   . Depression   . Diabetes mellitus without complication ([place])   . Diverticulitis   . GERD (gastroesophageal reflux disease)   . Hypertension   . IBS (irritable bowel syndrome)   . Kidney stone   . Migraine headache   . Neuropathy     Past Surgical History:  Procedure Laterality Date  . ANKLE SURGERY Right   . CARPAL TUNNEL RELEASE    . CHOLECYSTECTOMY    . COLONOSCOPY     Family History:  Family History  Problem Relation Age of Onset  . CAD Mother   . CAD Brother     Social History:  Social History   Substance and Sexual Activity  Alcohol Use No     Social History   Substance and Sexual Activity  Drug Use Not Currently    Social History   Socioeconomic History  . Marital status: Single    Spouse name: Not on file  . Number of children: Not on file  . Years of education: Not on file  . Highest education level: Not on file  Occupational History  . Not on file  Social Needs  . Financial resource strain: Not on file  . Food insecurity:    Worry: Not on file    Inability: Not on file  . Transportation needs:    Medical: Not on file    Non-medical: Not on file  Tobacco Use  . Smoking status: Never  Smoker  . Smokeless tobacco: Never Used  Substance and Sexual Activity  . Alcohol use: No  . Drug use: Not Currently  . Sexual activity: Not on file  Lifestyle  . Physical activity:    Days per week: Not on file    Minutes per session: Not on file  . Stress: Not on file  Relationships  . Social connections:    Talks on phone: Not on file    Gets together: Not on file    Attends religious service: Not on file    Active member of club or organization: Not on file    Attends meetings of clubs or organizations: Not on file    Relationship status: Not on file  Other Topics Concern  . Not on file  Social History Narrative  . Not on file   Additional Social History:                         Sleep: Fair  Appetite:  Fair  Current Medications: Current Facility-Administered Medications  Medication Dose Route Frequency Provider Last Rate Last Dose  . acetaminophen (TYLENOL) tablet 650 mg  650 mg Oral Q6H PRN Patriciaann Clan E, PA-C      . alum &  mag hydroxide-simeth (MAALOX/MYLANTA) 200-200-20 MG/5ML suspension 30 mL  30 mL Oral Q4H PRN Patriciaann Clan E, PA-C      . amLODipine (NORVASC) tablet 5 mg  5 mg Oral Daily Johnn Hai, MD   5 mg at 05/17/18 0804  . busPIRone (BUSPAR) tablet 15 mg  15 mg Oral TID Johnn Hai, MD   15 mg at 05/18/18 0818  . DULoxetine (CYMBALTA) DR capsule 60 mg  60 mg Oral BID Johnn Hai, MD   60 mg at 05/18/18 0818  . hydrOXYzine (ATARAX/VISTARIL) tablet 25 mg  25 mg Oral Q6H PRN Laverle Hobby, PA-C   25 mg at 05/17/18 2122  . insulin aspart (novoLOG) injection 16 Units  16 Units Subcutaneous TID WC Johnn Hai, MD   16 Units at 05/18/18 [id number]  . insulin glargine (LANTUS) injection 35 Units  35 Units Subcutaneous Daily Johnn Hai, MD   35 Units at 05/18/18 [phone]  . OLANZapine zydis (ZYPREXA) disintegrating tablet 5 mg  5 mg Oral Q8H PRN Laverle Hobby, PA-C       And  . LORazepam (ATIVAN) tablet 1 mg  1 mg Oral PRN Patriciaann Clan E, PA-C        And  . ziprasidone (GEODON) injection 20 mg  20 mg Intramuscular PRN Patriciaann Clan E, PA-C      . magnesium hydroxide (MILK OF MAGNESIA) suspension 30 mL  30 mL Oral Daily PRN Patriciaann Clan E, PA-C      . pantoprazole (PROTONIX) EC tablet 40 mg  40 mg Oral BID Johnn Hai, MD   40 mg at 05/18/18 0818  . pregabalin (LYRICA) capsule 200 mg  200 mg Oral BID Johnn Hai, MD   200 mg at 05/18/18 0820  . traZODone (DESYREL) tablet 50 mg  50 mg Oral QHS,MR X 1 Rennie Plowman   Stopped at 05/18/18 3810    Lab Results:  Results for orders placed or performed during the hospital encounter of 05/15/18 (from the past 48 hour(s))  Glucose, capillary     Status: Abnormal   Collection Time: 05/17/18  6:04 AM  Result Value Ref Range   Glucose-Capillary 266 (H) 70 - 99 mg/dL   Comment 1 Notify RN   Glucose, capillary     Status: Abnormal   Collection Time: 05/17/18  8:10 AM  Result Value Ref Range   Glucose-Capillary 381 (H) 70 - 99 mg/dL  Glucose, capillary     Status: Abnormal   Collection Time: 05/18/18  6:22 AM  Result Value Ref Range   Glucose-Capillary 219 (H) 70 - 99 mg/dL    Blood Alcohol level:  Lab Results  Component Value Date   ETH <10 05/14/2018   ETH <10 17/51/0258    Metabolic Disorder Labs: Lab Results  Component Value Date   HGBA1C 9.7 (H) 03/13/2018   MPG 231.69 03/13/2018   MPG 191.51 10/05/2017   No results found for: PROLACTIN No results found for: CHOL, TRIG, HDL, CHOLHDL, VLDL, LDLCALC  Physical Findings: AIMS: Facial and Oral Movements Muscles of Facial Expression: None, normal Lips and Perioral Area: None, normal Jaw: None, normal Tongue: None, normal,Extremity Movements Upper (arms, wrists, hands, fingers): None, normal Lower (legs, knees, ankles, toes): None, normal, Trunk Movements Neck, shoulders, hips: None, normal, Overall Severity Severity of abnormal movements (highest score from questions above): None, normal Incapacitation due to  abnormal movements: None, normal Patient's awareness of abnormal movements (rate only patient's report): No Awareness, Dental Status Current problems with teeth  and/or dentures?: No Does patient usually wear dentures?: No  CIWA:    COWS:     Musculoskeletal: Strength & Muscle Tone: within normal limits Gait & Station: normal Patient leans: N/A  Psychiatric Specialty Exam: Physical Exam  ROS  Blood pressure (!) 89/60, pulse 69, temperature 98.4 F (36.9 C), temperature source Oral, resp. rate 18, height 5' 10.5" (1.791 m), weight 85.3 kg.Body mass index is 26.59 kg/m.  General Appearance: Casual  Eye Contact:  Good  Speech:  Clear and Coherent  Volume:  Decreased  Mood:  Dysphoric  Affect:  Flat  Thought Process:  Coherent  Orientation:  Full (Time, Place, and Person)  Thought Content:  Logical  Suicidal Thoughts:  No  Homicidal Thoughts:  No  Memory:  Immediate;   Fair  Judgement:  Fair  Insight:  Fair  Psychomotor Activity:  Normal  Concentration:  Concentration: Fair  Recall:  [employer] of Knowledge:  Fair  Language:  Fair  Akathisia:  Negative  Handed:  Right  AIMS (if indicated):     Assets:  Physical Health  ADL's:  Intact  Cognition:  WNL  Sleep:  Number of Hours: 6.75     Treatment Plan Summary: Daily contact with patient to assess and evaluate symptoms and progress in treatment, Medication management and Plan Orthostasis discontinue beta-blocker, for depression continue cognitive therapy for safety reasons continue current monitoring probable discharge tomorrow  Johnn Hai, MD 05/18/2018, 10:18 AM

## 2018-05-18 NOTE — Progress Notes (Signed)
CSW received call from patient's sister, Kenney Houseman [phone]). CSW received verbal permission from patient to speak with sister.  Sister reports that she has been in communication with the group home administrator, Rise Paganini, and that she and Rise Paganini believe the patient requires "a higher level of care." When initially asked what she meant by a higher level of care, sister stated care related to ADL's, CSW explained to sister (and earlier to group home administrator) that nursing home placements and assisted living facility placements are not coordinated from an acute psychiatric facility and if placement is desired, it should be pursed from the community.  Sister backtracked and states the patient needs a higher level of care psychiatrically, not medically. Sister is a resident of [facility] and her expectations of care appear to be for the patient to be discharged to a long term psychiatric care facility. CSW explained that this is not the psychiatrists recommendation at this time and the patient is likely to discharge tomorrow or shortly afterward.  Sister states she has consulted an attorney and would like to speak with psychiatry. She feels the patient is a danger to himself and others, but failed to provide examples beyond the patient not bathing or attending to personal hygiene.  CSW unaware of patient being served a 30 day notice from his group home or any legal reasons the patient would not be allowed to return. Patient is his own legal guardian.  CSW spoke with psychiatry and provided contact information for sister, should they wish to follow up with her.  Stephanie Acre, LCSW-A Clinical Social Worker

## 2018-05-19 LAB — GLUCOSE, CAPILLARY
Glucose-Capillary: 214 mg/dL — ABNORMAL HIGH (ref 70–99)
Glucose-Capillary: 176 mg/dL — ABNORMAL HIGH (ref 70–99)

## 2018-05-19 MED ORDER — PREGABALIN 100 MG PO CAPS: 100.0000 mg | ORAL_CAPSULE | Freq: Three times a day (TID) | ORAL | Status: DC

## 2018-05-19 MED ORDER — DULOXETINE HCL 60 MG PO CPEP: 60.0000 mg | ORAL_CAPSULE | Freq: Two times a day (BID) | ORAL | 2 refills | Status: AC

## 2018-05-19 MED ORDER — BENZTROPINE MESYLATE 1 MG PO TABS: 1.0000 mg | ORAL_TABLET | Freq: Three times a day (TID) | ORAL | 2 refills | Status: AC

## 2018-05-19 MED ORDER — BUSPIRONE HCL 15 MG PO TABS: 15.0000 mg | ORAL_TABLET | Freq: Three times a day (TID) | ORAL | 2 refills | Status: AC

## 2018-05-19 MED ORDER — TRAZODONE HCL 50 MG PO TABS: 50.0000 mg | ORAL_TABLET | Freq: Every evening | ORAL | 2 refills | Status: AC | PRN

## 2018-05-19 MED ORDER — PREGABALIN 100 MG PO CAPS: 100.0000 mg | ORAL_CAPSULE | Freq: Three times a day (TID) | ORAL | 2 refills | Status: AC

## 2018-05-19 MED ORDER — BENZTROPINE MESYLATE 1 MG PO TABS
1.0000 mg | ORAL_TABLET | Freq: Three times a day (TID) | ORAL | Status: DC
  Filled 2018-05-19 (×6): qty 1

## 2018-05-19 NOTE — Progress Notes (Signed)
D:  Nasif has isolated to his room all shift.  He was encouraged to get out of bed to take a shower due to having episode of fecal incontinence.  Staff assisted him in changing sheets and cleaning the mattress with purple top wipes Although he did shower, hygiene remains poor.  He continues to voice passive SI and verbally agrees to not harm himself on the unit.  He continues to hear command hallucinations but he tries to ignore the voices.  He did get up for hs medications and snack this shift.  He is currently awake and came to the day room for vital signs.   A:  1:1 with RN for support and encouragement.  Medications as ordered.  Q 15 minute checks maintained for safety.  Encouraged participation in group and unit activities.   R:  Leviticus remains safe on the unit.  We will continue to monitor the progress towards his goals.

## 2018-05-19 NOTE — BHH Suicide Risk Assessment (Signed)
[facility] Discharge Suicide Risk Assessment   Principal Problem: MDD (major depressive disorder), recurrent, severe, with psychosis ([place]) Discharge Diagnoses: Principal Problem:   MDD (major depressive disorder), recurrent, severe, with psychosis ([place])   Total Time spent with patient: 45 minutes  Current mental status exam-alert oriented to person place situation general time of day so forth denies wanting to harm self denies wanting to harm others denies hallucinations.  Mental Status Per Nursing Assessment::   On Admission:  Suicidal ideation indicated by patient  Demographic Factors:  Male  Loss Factors: Decrease in vocational status  Historical Factors: Impulsivity  Risk Reduction Factors:   Positive therapeutic relationship  Continued Clinical Symptoms:  Dysthymia  Cognitive Features That Contribute To Risk:  Polarized thinking    Suicide Risk:  Minimal: No identifiable suicidal ideation.  Patients presenting with no risk factors but with morbid ruminations; may be classified as minimal risk based on the severity of the depressive symptoms  Follow-up Information    [facility] Follow up.   Why:  Office will contact you for appointments. Contact information: [place] [address] [phone]           Plan Of Care/Follow-up recommendations:  Activity:  full  Emmalia Heyboer, MD 05/19/2018, 8:58 AM

## 2018-05-19 NOTE — Plan of Care (Signed)
Discharge note  Patient verbalizes readiness for discharge. Follow up plan explained, AVS, Transition record and SRA given. Prescriptions and teaching provided. Belongings returned and signed for. Suicide safety plan completed and signed. Patient verbalizes understanding. Patient denies SI/HI and assures this Probation officer he will seek assistance should that change. Patient discharged to lobby where nursing home driver was waiting.  Problem: Education: Goal: Knowledge of [facility] Education information/materials will improve Outcome: Adequate for Discharge Goal: Emotional status will improve Outcome: Adequate for Discharge Goal: Mental status will improve Outcome: Adequate for Discharge Goal: Verbalization of understanding the information provided will improve Outcome: Adequate for Discharge   Problem: Activity: Goal: Interest or engagement in activities will improve Outcome: Adequate for Discharge Goal: Sleeping patterns will improve Outcome: Adequate for Discharge   Problem: Coping: Goal: Ability to verbalize frustrations and anger appropriately will improve Outcome: Adequate for Discharge Goal: Ability to demonstrate self-control will improve Outcome: Adequate for Discharge   Problem: Health Behavior/Discharge Planning: Goal: Identification of resources available to assist in meeting health care needs will improve Outcome: Adequate for Discharge Goal: Compliance with treatment plan for underlying cause of condition will improve Outcome: Adequate for Discharge   Problem: Physical Regulation: Goal: Ability to maintain clinical measurements within normal limits will improve Outcome: Adequate for Discharge   Problem: Safety: Goal: Periods of time without injury will increase Outcome: Adequate for Discharge   Problem: Education: Goal: Ability to make informed decisions regarding treatment will improve Outcome: Adequate for Discharge   Problem: Coping: Goal: Coping  ability will improve Outcome: Adequate for Discharge   Problem: Health Behavior/Discharge Planning: Goal: Identification of resources available to assist in meeting health care needs will improve Outcome: Adequate for Discharge   Problem: Medication: Goal: Compliance with prescribed medication regimen will improve Outcome: Adequate for Discharge   Problem: Self-Concept: Goal: Ability to disclose and discuss suicidal ideas will improve Outcome: Adequate for Discharge Goal: Will verbalize positive feelings about self Outcome: Adequate for Discharge   Problem: Activity: Goal: Will verbalize the importance of balancing activity with adequate rest periods Outcome: Adequate for Discharge   Problem: Education: Goal: Will be free of psychotic symptoms Outcome: Adequate for Discharge Goal: Knowledge of the prescribed therapeutic regimen will improve Outcome: Adequate for Discharge   Problem: Coping: Goal: Coping ability will improve Outcome: Adequate for Discharge Goal: Will verbalize feelings Outcome: Adequate for Discharge   Problem: Health Behavior/Discharge Planning: Goal: Compliance with prescribed medication regimen will improve Outcome: Adequate for Discharge   Problem: Nutritional: Goal: Ability to achieve adequate nutritional intake will improve Outcome: Adequate for Discharge   Problem: Role Relationship: Goal: Ability to communicate needs accurately will improve Outcome: Adequate for Discharge Goal: Ability to interact with others will improve Outcome: Adequate for Discharge   Problem: Safety: Goal: Ability to redirect hostility and anger into socially appropriate behaviors will improve Outcome: Adequate for Discharge Goal: Ability to remain free from injury will improve Outcome: Adequate for Discharge   Problem: Self-Care: Goal: Ability to participate in self-care as condition permits will improve Outcome: Adequate for Discharge   Problem:  Self-Concept: Goal: Will verbalize positive feelings about self Outcome: Adequate for Discharge   Problem: Activity: Goal: Will identify at least one activity in which they can participate Outcome: Adequate for Discharge   Problem: Coping: Goal: Ability to identify and develop effective coping behavior will improve Outcome: Adequate for Discharge Goal: Ability to interact with others will improve Outcome: Adequate for Discharge Goal: Demonstration of participation in decision-making regarding own  care will improve Outcome: Adequate for Discharge Goal: Ability to use eye contact when communicating with others will improve Outcome: Adequate for Discharge   Problem: Health Behavior/Discharge Planning: Goal: Identification of resources available to assist in meeting health care needs will improve Outcome: Adequate for Discharge   Problem: Self-Concept: Goal: Will verbalize positive feelings about self Outcome: Adequate for Discharge

## 2018-05-19 NOTE — BHH Group Notes (Signed)
LCSW Group Therapy Notes    Type of Therapy and Topic: Group Therapy: Effective Communication   Participation Level: Did not attend.   Description of Group:  In this group patients will be asked to identify their own styles of communication as well as defining and identifying passive, assertive, and aggressive styles of communication. Participants will identify strategies to communicate in a more assertive manner in an effort to appropriately meet their needs. This group will be process-oriented, with patients participating in exploration of their own experiences as well as giving and receiving support and challenge from other group members.   Therapeutic Goals: 1. Patient will identify their personal communication style. 2. Patient will identify passive, assertive, and aggressive forms of communication. 3. Patient will identify strategies for developing more effective communication to appropriately meet their needs.    Summary of Patient Progress Patient invited to attend group, was sleeping.   Therapeutic Modalities:  Communication Skills Solution Focused Therapy Motivational [place], MSW, LCSWA

## 2018-05-19 NOTE — Progress Notes (Addendum)
CSW spoke to [place] [phone]), group home administrator. Informed Rise Paganini of patient's discharge and of plan for patient to return to group home. Beverly agreeable. Beverly to call support staff to pick up patient and transport patient back to group home, likely this afternoon.  Beverly requesting faxed copies of discharge summary and medication list to [phone].   Update 1:00pm CSW called Rise Paganini again, Rise Paganini states group home staff are en route to pick up patient. Nursing informed.   Update 3:10pm CSW called [facility] again. The call went to voicemail, CSW left voicemail asking for updated pick-up time for patient and provided call back number for CSW and nurses station.   Update 3:30pm CSW called [facility] again. The call was not answered. CSW left another voicemail.  Update 3:45pm CSW staffed case with [facility] AC. AC advised CSW to contact local non-emergency law enforcement for a wellness check.   CSW spoke with [facility] [phone]). Deputy familiar with group home and patient. States he will complete a welfare check this evening. CSW provided nurses desk number and [facility] number for follow up.  Residence:  [address] [place], [place] [address] [place]  Update 3:55pm [place] staffed case with Social Work Surveyor, quantity. Per SW AD, as the group home previously stated they were in agreement to pick patient up today, allow the group home time this afternoon to retrieve patient and call back.  If patient is not picked up today, CSW advised to contact [facility] tomorrow morning.   Stephanie Acre, LCSW-A Clinical Social Worker

## 2018-05-19 NOTE — Discharge Summary (Signed)
Physician Discharge Summary Note  Patient:  [name] is an 57 y.o., male MRN:  [id number] DOB:  Apr 07, 1961 Patient phone:  [phone] (home)  Patient address:   [phone] [place] [address],  Total Time spent with patient: 45 minutes  Date of Admission:  05/15/2018 Date of Discharge: 05/19/18  Reason for Admission:    Mr. Schauer is 57 years of age she has a history of probable bipolar type I depression, type 1 diabetes, history of essential hypertension, history of congestive heart failure and he has a group home resident.  At baseline he is incontinent and wears diapers but we felt this was more behavioral than related to neuropsychiatric pathology or any organic pathology. On presentation was minimally talkative endorsed wanting to harm himself without plans or intent.  Principal Problem: MDD (major depressive disorder), recurrent, severe, with psychosis ([place]) Discharge Diagnoses: Principal Problem:   MDD (major depressive disorder), recurrent, severe, with psychosis ([place])   Past Medical History:  Past Medical History:  Diagnosis Date  . Anxiety   . Bipolar disorder ([place])   . Chronic pain   . Depression   . Diabetes mellitus without complication ([place])   . Diverticulitis   . GERD (gastroesophageal reflux disease)   . Hypertension   . IBS (irritable bowel syndrome)   . Kidney stone   . Migraine headache   . Neuropathy     Past Surgical History:  Procedure Laterality Date  . ANKLE SURGERY Right   . CARPAL TUNNEL RELEASE    . CHOLECYSTECTOMY    . COLONOSCOPY     Family History:  Family History  Problem Relation Age of Onset  . CAD Mother   . CAD Brother     Social History:  Social History   Substance and Sexual Activity  Alcohol Use No     Social History   Substance and Sexual Activity  Drug Use Not Currently    Social History   Socioeconomic History  . Marital status: Single    Spouse name: Not on file  . Number of children: Not on file   . Years of education: Not on file  . Highest education level: Not on file  Occupational History  . Not on file  Social Needs  . Financial resource strain: Not on file  . Food insecurity:    Worry: Not on file    Inability: Not on file  . Transportation needs:    Medical: Not on file    Non-medical: Not on file  Tobacco Use  . Smoking status: Never Smoker  . Smokeless tobacco: Never Used  Substance and Sexual Activity  . Alcohol use: No  . Drug use: Not Currently  . Sexual activity: Not on file  Lifestyle  . Physical activity:    Days per week: Not on file    Minutes per session: Not on file  . Stress: Not on file  Relationships  . Social connections:    Talks on phone: Not on file    Gets together: Not on file    Attends religious service: Not on file    Active member of club or organization: Not on file    Attends meetings of clubs or organizations: Not on file    Relationship status: Not on file  Other Topics Concern  . Not on file  Social History Narrative  . Not on file    Hospital Course:    Once here the patient generally stayed to himself he would  participate to the extent he could with cognitive based therapies.  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 His sister had phoned hoping he could find some type of long-term treatment for him however he is not acutely dangerous and again we feel his endorsing suicidal thinking is more manipulative than genuine, as well as intermittent endorsing of psychotic symptoms. Each day on rounds he requested to stay longer simply wanting to stay hospitalized rather than go back to his group home for nonspecific reasons.  He would never really expresses hesitancy more fully than that. By the date of the 31st he was clear he had benefited maximally from med adjustments and inpatient stay.  Added benztropine to help with  constipation as he stated he simply could not get to the bathroom in time, we discontinued his Norvasc because of the hypotension he was having, and of course escalated his antidepressant and spread out his Lyrica to lower GI side effects.   Physical Findings: AIMS: Facial and Oral Movements Muscles of Facial Expression: None, normal Lips and Perioral Area: None, normal Jaw: None, normal Tongue: None, normal,Extremity Movements Upper (arms, wrists, hands, fingers): None, normal Lower (legs, knees, ankles, toes): None, normal, Trunk Movements Neck, shoulders, hips: None, normal, Overall Severity Severity of abnormal movements (highest score from questions above): None, normal Incapacitation due to abnormal movements: None, normal Patient's awareness of abnormal movements (rate only patient's report): No Awareness, Dental Status Current problems with teeth and/or dentures?: No Does patient usually wear dentures?: No  CIWA:    COWS:     Musculoskeletal: Strength & Muscle Tone: within normal limits Gait & Station: normal Patient leans: N/A  Psychiatric Specialty Exam: Physical Exam  ROS  Blood pressure (!) 75/53, pulse 85, temperature 97.6 F (36.4 C), temperature source Oral, resp. rate 18, height 5' 10.5" (1.791 m), weight 85.3 kg.Body mass index is 26.59 kg/m.  General Appearance: Casual  Eye Contact:  Good  Speech:  Clear and Coherent  Volume:  Decreased  Mood:  Euthymic  Affect:  Appropriate  Thought Process:  Coherent  Orientation:  Full (Time, Place, and Person)  Thought Content:  Tangential  Suicidal Thoughts:  No  Homicidal Thoughts:  No  Memory:  Immediate;   Fair  Judgement:  Fair  Insight:  Fair  Psychomotor Activity:  Normal  Concentration:  Concentration: Fair  Recall:  [employer] of Knowledge:  Fair  Language:  Fair  Akathisia:  Negative  Handed:  Right  AIMS (if indicated):     Assets:  Physical Health  ADL's:  Intact  Cognition:  WNL  Sleep:   Number of Hours: 6.75        Has this patient used any form of tobacco in the last 30 days? (Cigarettes, Smokeless Tobacco, Cigars, and/or Pipes) Yes, No  Blood Alcohol level:  Lab Results  Component Value Date   ETH <10 05/14/2018   ETH <10 93/23/5573    Metabolic Disorder Labs:  Lab Results  Component Value Date   HGBA1C 9.7 (H) 03/13/2018   MPG 231.69 03/13/2018   MPG 191.51 10/05/2017   No results found for: PROLACTIN No results found for: CHOL, TRIG, HDL, CHOLHDL, VLDL, LDLCALC  See Psychiatric Specialty Exam and Suicide Risk Assessment completed by Attending Physician prior to discharge.  Discharge destination:  Home  Is patient on multiple antipsychotic therapies at discharge:  No   Has Patient  had three or more failed trials of antipsychotic monotherapy by history:  No  Recommended Plan for Multiple Antipsychotic Therapies: NA   Allergies as of 05/19/2018      Reactions   Meloxicam Other (See Comments)   Acid Reflux    Venlafaxine Other (See Comments)   Acid Reflux   Penicillins Rash   Has patient had a PCN reaction causing immediate rash, facial/tongue/throat swelling, SOB or lightheadedness with hypotension: Yes Has patient had a PCN reaction causing severe rash involving mucus membranes or skin necrosis: No Has patient had a PCN reaction that required hospitalization: No Has patient had a PCN reaction occurring within the last 10 years: No If all of the above answers are "NO", then may proceed with Cephalosporin use.      Medication List    STOP taking these medications   amLODipine 5 MG tablet Commonly known as:  NORVASC   metoprolol tartrate 50 MG tablet Commonly known as:  LOPRESSOR     TAKE these medications     Indication  benztropine 1 MG tablet Commonly known as:  COGENTIN Take 1 tablet (1 mg total) by mouth 3 (three) times daily.  Indication:  Extrapyramidal Reaction caused by Medications   busPIRone 15 MG tablet Commonly known as:   BUSPAR Take 1 tablet (15 mg total) by mouth 3 (three) times daily.  Indication:  Anxiety Disorder   CREON 36000 UNITS Cpep capsule Generic drug:  lipase/protease/amylase Take 36,000 Units by mouth 3 (three) times daily with meals.  Indication:  Pancreatic Insufficiency   DULoxetine 60 MG capsule Commonly known as:  CYMBALTA Take 1 capsule (60 mg total) by mouth 2 (two) times daily. What changed:  when to take this  Indication:  Major Depressive Disorder   hydrOXYzine 50 MG tablet Commonly known as:  ATARAX/VISTARIL Take 50 mg by mouth 2 (two) times daily as needed for anxiety.  Indication:  Feeling Anxious   insulin aspart 100 UNIT/ML injection Commonly known as:  novoLOG Inject 16 Units into the skin 3 (three) times daily.  Indication:  High Blood Sugar   insulin glargine 100 UNIT/ML injection Commonly known as:  LANTUS Inject 35 Units into the skin daily before breakfast.  Indication:  Insulin-Dependent Diabetes   ondansetron 4 MG disintegrating tablet Commonly known as:  ZOFRAN ODT Take 1 tablet (4 mg total) by mouth every 8 (eight) hours as needed for nausea or vomiting.  Indication:  Nausea and Vomiting   pantoprazole 40 MG tablet Commonly known as:  PROTONIX Take 1 tablet (40 mg total) by mouth daily.  Indication:  Heartburn   pregabalin 100 MG capsule Commonly known as:  LYRICA Take 1 capsule (100 mg total) by mouth 3 (three) times daily. What changed:    medication strength  how much to take  when to take this  Indication:  Generalized Anxiety Disorder   traZODone 50 MG tablet Commonly known as:  DESYREL Take 1 tablet (50 mg total) by mouth at bedtime as needed for sleep.  Indication:  Anxiety Disorder      Follow-up Information    [facility] Follow up.   Why:  Office will contact you for appointments. 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

## 2018-05-19 NOTE — Progress Notes (Signed)
Recreation Therapy Notes  INPATIENT RECREATION TR PLAN  Patient Details Name: [name] MRN: [id number] DOB: 1961-01-05 Today's Date: 05/19/2018  Rec Therapy Plan Is patient appropriate for Therapeutic Recreation?: Yes Treatment times per week: about 3 days Estimated Length of Stay: 5-7 days TR Treatment/Interventions: Group participation (Comment)  Discharge Criteria Pt will be discharged from therapy if:: Discharged Treatment plan/goals/alternatives discussed and agreed upon by:: Patient/family  Discharge Summary Short term goals set: See patient care plan Short term goals met: Not met Reason goals not met: Pt did not attend group sessions. Therapeutic equipment acquired: N/A Reason patient discharged from therapy: Discharge from hospital Pt/family agrees with progress & goals achieved: Yes Date patient discharged from therapy: 05/19/18     Victorino Sparrow, LRT/CTRS  Ria Comment, Jaiven Graveline A 05/19/2018, 9:34 AM

## 2018-05-19 NOTE — Progress Notes (Signed)
  [facility] Adult Case Management Discharge Plan :  Will you be returning to the same living situation after discharge:  Yes,  [facility] At discharge, do you have transportation home?: Yes,  Group home will transport patient Do you have the ability to pay for your medications: Yes,  Medicare  Release of information consent forms completed and in the chart;  Patient's signature needed at discharge.  Patient to Follow up at: Follow-up Information    [facility] Follow up.   Why:  Office will contact you for appointments. Please call to follow up as well.  Contact information: [place] [address] [phone]           Next level of care provider has access to [facility] and Suicide Prevention discussed: Yes,  with brother    Has patient been referred to the Quitline?: Patient refused referral  Patient has been referred for addiction treatment: Yes  Joellen [place], [place] 05/19/2018, 9:25 AM

## 2018-05-19 NOTE — Plan of Care (Signed)
Pt did not attend recreation therapy group sessions.   Abi Shoults, LRT/CTRS 

## 2018-05-19 NOTE — Plan of Care (Signed)
  Problem: Activity: Goal: Sleeping patterns will improve Outcome: Progressing   Problem: Activity: Goal: Interest or engagement in activities will improve Outcome: Not Progressing

## 2018-05-19 NOTE — Progress Notes (Signed)
Recreation Therapy Notes  Date: 12.31.19 Time: 1000 Location: 500 Hall Dayroom  Group Topic: Goal Setting  Goal Area(s) Addresses:  Patient will be able to identify at least 3 goals for the new year. Patient will be able to identify benefit of investing in themselves.  Patient will be able to identify benefit of setting goals.   Intervention: Worksheet  Activity: Outlook for the [employer].  Patients were to identify what they want in the new year, what they need, what they want to share and what they want to succeed at in the new year.  Education:  Discharge Planning, Coping Skills, Goal Setting  Education Outcome: Acknowledges Education/In Group Clarification Provided/Needs Additional Education  Clinical Observations: Pt did not attend group.    Victorino Sparrow, LRT/CTRS         Ria Comment, Jasani Lengel A 05/19/2018 11:01 AM

## 2018-05-21 ENCOUNTER — Other Ambulatory Visit: Payer: Self-pay

## 2018-05-21 ENCOUNTER — Emergency Department (HOSPITAL_COMMUNITY)
Admission: EM | Admit: 2018-05-21 | Discharge: 2018-05-21 | Disposition: A | Discharge status: Home / Self Care | Payer: Medicare Other | Attending: Emergency Medicine | Admitting: Emergency Medicine

## 2018-05-21 ENCOUNTER — Encounter (HOSPITAL_COMMUNITY): Payer: Self-pay

## 2018-05-21 DIAGNOSIS — R9431 Abnormal electrocardiogram [ECG] [EKG]: Secondary | ICD-10-CM | POA: Diagnosis not present

## 2018-05-21 DIAGNOSIS — Z79899 Other long term (current) drug therapy: Secondary | ICD-10-CM | POA: Insufficient documentation

## 2018-05-21 DIAGNOSIS — F419 Anxiety disorder, unspecified: Secondary | ICD-10-CM | POA: Diagnosis not present

## 2018-05-21 DIAGNOSIS — F250 Schizoaffective disorder, bipolar type: Secondary | ICD-10-CM | POA: Diagnosis not present

## 2018-05-21 DIAGNOSIS — F329 Major depressive disorder, single episode, unspecified: Secondary | ICD-10-CM | POA: Diagnosis present

## 2018-05-21 DIAGNOSIS — Z794 Long term (current) use of insulin: Secondary | ICD-10-CM | POA: Insufficient documentation

## 2018-05-21 DIAGNOSIS — R45851 Suicidal ideations: Secondary | ICD-10-CM | POA: Insufficient documentation

## 2018-05-21 DIAGNOSIS — I10 Essential (primary) hypertension: Secondary | ICD-10-CM | POA: Insufficient documentation

## 2018-05-21 DIAGNOSIS — E119 Type 2 diabetes mellitus without complications: Secondary | ICD-10-CM | POA: Insufficient documentation

## 2018-05-21 DIAGNOSIS — F319 Bipolar disorder, unspecified: Secondary | ICD-10-CM | POA: Diagnosis not present

## 2018-05-21 LAB — CBC WITH DIFFERENTIAL/PLATELET
Abs Immature Granulocytes: 0.04 10*3/uL (ref 0.00–0.07)
Basophils Absolute: 0 10*3/uL (ref 0.0–0.1)
Basophils Relative: 0 %
Eosinophils Absolute: 0.2 10*3/uL (ref 0.0–0.5)
Eosinophils Relative: 2 %
HCT: 48.1 % (ref 39.0–52.0)
Hemoglobin: 14.9 g/dL (ref 13.0–17.0)
Immature Granulocytes: 0 %
Lymphocytes Relative: 23 %
Lymphs Abs: 2.1 10*3/uL (ref 0.7–4.0)
MCH: 27 pg (ref 26.0–34.0)
MCHC: 31 g/dL (ref 30.0–36.0)
MCV: 87.3 fL (ref 80.0–100.0)
Monocytes Absolute: 0.5 10*3/uL (ref 0.1–1.0)
Monocytes Relative: 5 %
NEUTROS PCT: 70 %
Neutro Abs: 6.3 10*3/uL (ref 1.7–7.7)
PLATELETS: 292 10*3/uL (ref 150–400)
RBC: 5.51 MIL/uL (ref 4.22–5.81)
RDW: 14.2 % (ref 11.5–15.5)
WBC: 9.1 10*3/uL (ref 4.0–10.5)
nRBC: 0 % (ref 0.0–0.2)

## 2018-05-21 LAB — COMPREHENSIVE METABOLIC PANEL
ALK PHOS: 109 U/L (ref 38–126)
ALT: 27 U/L (ref 0–44)
AST: 18 U/L (ref 15–41)
Albumin: 3.7 g/dL (ref 3.5–5.0)
Anion gap: 7 (ref 5–15)
BUN: 23 mg/dL — ABNORMAL HIGH (ref 6–20)
CALCIUM: 9.1 mg/dL (ref 8.9–10.3)
CO2: 26 mmol/L (ref 22–32)
Chloride: 105 mmol/L (ref 98–111)
Creatinine, Ser: 0.93 mg/dL (ref 0.61–1.24)
GFR calc Af Amer: >60 mL/min (ref >60)
GFR calc non Af Amer: >60 mL/min (ref >60)
Glucose, Bld: 242 mg/dL — ABNORMAL HIGH (ref 70–99)
Potassium: 4.1 mmol/L (ref 3.5–5.1)
Sodium: 138 mmol/L (ref 135–145)
Total Bilirubin: 0.7 mg/dL (ref 0.3–1.2)
Total Protein: 7.2 g/dL (ref 6.5–8.1)

## 2018-05-21 LAB — RAPID URINE DRUG SCREEN, HOSP PERFORMED
Amphetamines: NOT DETECTED
Barbiturates: NOT DETECTED
Benzodiazepines: NOT DETECTED
Cocaine: NOT DETECTED
Opiates: NOT DETECTED
Tetrahydrocannabinol: NOT DETECTED

## 2018-05-21 LAB — ETHANOL: Alcohol, Ethyl (B): <10 mg/dL (ref <10)

## 2018-05-21 NOTE — Discharge Instructions (Addendum)
You were evaluated in the Emergency Department and after careful evaluation, we did not find any emergent condition requiring admission or further testing in the hospital. ° °Please return to the Emergency Department if you experience any worsening of your condition.  We encourage you to follow up with a primary care provider.  Thank you for allowing us to be a part of your care. °

## 2018-05-21 NOTE — ED Notes (Signed)
[name] picked pt up from [facility].

## 2018-05-21 NOTE — ED Notes (Signed)
TTS in progress 

## 2018-05-21 NOTE — ED Provider Notes (Signed)
  Provider Note MRN:  [id number]  Arrival date & time: 05/21/18    ED Course and Medical Decision Making  I received sign out for this patient at shift change.  Continued adamant suicidality, states he will definitely take his life if he were sent back to his group home.  Was able to speak with group home, who explains that he has been much more distraught ever since his family brought him to a graveyard to see a deceased family member.  Will consult TTS.  Per behavioral health and psychiatry, patient is psychiatrically cleared.  Patient has cognitive impairment, according to their note seems to appreciate the attention of a hospital setting.  They have low concern for his suicidal claims.  Awaiting transport back to his group home.  Barth Kirks. Sedonia Small, [place] [email or web address]    Maudie Flakes, MD 05/21/18 2100

## 2018-05-21 NOTE — ED Provider Notes (Signed)
[facility] EMERGENCY DEPARTMENT Provider Note   CSN: [id number] Arrival date & time: 05/21/18  1347     History   Chief Complaint Chief Complaint  Patient presents with  . Suicidal   Level 5 caveat patient cognitively impaired and psychiatric patient HPI [name] is a 58 y.o. male.  HPI Patient reports "I want to commit suicide for a long time."  He has no definite plan.   He was sent here from his group home.  He was admitted to behavioral health hospital and discharged 05/19/2018 for similar complaint.  Medicines were adjusted.  No other associated symptoms.  Presently hungry. Past Medical History:  Diagnosis Date  . Anxiety   . Bipolar disorder ([place])   . Chronic pain   . Depression   . Diabetes mellitus without complication ([place])   . Diverticulitis   . GERD (gastroesophageal reflux disease)   . Hypertension   . IBS (irritable bowel syndrome)   . Kidney stone   . Migraine headache   . Neuropathy     Patient Active Problem List   Diagnosis Date Noted  . MDD (major depressive disorder), recurrent, severe, with psychosis ([place]) 05/15/2018  . Uncontrolled type 2 diabetes mellitus with hyperglycemia ([place]) 03/13/2018  . Diabetic foot ulcer ([place]) 03/13/2018  . Cellulitis in diabetic foot ([place]) 03/13/2018  . Transaminasemia   . Heel ulcer ([place]) 03/12/2018  . Chest pain 10/05/2017  . Bipolar disorder ([place])   . Fever 12/04/2014  . Cellulitis of left foot 12/04/2014  . Diabetes mellitus with neuropathy ([place]) 12/04/2014  . Hypertension 12/04/2014    Past Surgical History:  Procedure Laterality Date  . ANKLE SURGERY Right   . CARPAL TUNNEL RELEASE    . CHOLECYSTECTOMY    . COLONOSCOPY          Home Medications    Prior to Admission medications   Medication Sig Start Date End Date Taking? Authorizing Provider  benztropine (COGENTIN) 1 MG tablet Take 1 tablet (1 mg total) by mouth 3 (three) times daily. 05/19/18   Johnn Hai, MD  busPIRone (BUSPAR) 15  MG tablet Take 1 tablet (15 mg total) by mouth 3 (three) times daily. 05/19/18   Johnn Hai, MD  DULoxetine (CYMBALTA) 60 MG capsule Take 1 capsule (60 mg total) by mouth 2 (two) times daily. 05/19/18   Johnn Hai, MD  hydrOXYzine (ATARAX/VISTARIL) 50 MG tablet Take 50 mg by mouth 2 (two) times daily as needed for anxiety.    [provider]  insulin aspart (NOVOLOG) 100 UNIT/ML injection Inject 16 Units into the skin 3 (three) times daily.     [provider]  insulin glargine (LANTUS) 100 UNIT/ML injection Inject 35 Units into the skin daily before breakfast.    [provider]  lipase/protease/amylase (CREON) 36000 UNITS CPEP capsule Take 36,000 Units by mouth 3 (three) times daily with meals.    [provider]  ondansetron (ZOFRAN ODT) 4 MG disintegrating tablet Take 1 tablet (4 mg total) by mouth every 8 (eight) hours as needed for nausea or vomiting. 04/27/18   Long, Wonda Olds, MD  pantoprazole (PROTONIX) 40 MG tablet Take 1 tablet (40 mg total) by mouth daily. 04/27/18 05/27/18  Long, Wonda Olds, MD  pregabalin (LYRICA) 100 MG capsule Take 1 capsule (100 mg total) by mouth 3 (three) times daily. 05/19/18   Johnn Hai, MD  traZODone (DESYREL) 50 MG tablet Take 1 tablet (50 mg total) by mouth at bedtime as needed for sleep.  05/19/18   Johnn Hai, MD    Family History Family History  Problem Relation Age of Onset  . CAD Mother   . CAD Brother     Social History Social History   Tobacco Use  . Smoking status: Never Smoker  . Smokeless tobacco: Never Used  Substance Use Topics  . Alcohol use: No  . Drug use: Not Currently     Allergies   Meloxicam; Venlafaxine; and Penicillins   Review of Systems Review of Systems  Unable to perform ROS: Psychiatric disorder  Skin: Positive for wound.       Wound on left heel  Allergic/Immunologic: Positive for immunocompromised state.       Diabetic  Psychiatric/Behavioral: Positive for suicidal  ideas.     Physical Exam Updated Vital Signs BP (!) 152/94 (BP Location: Right Arm)   Pulse 89   Temp 98.5 F (36.9 C) (Oral)   Resp 20   Ht 5\' 10"  (1.778 m)   Wt 81.6 kg   SpO2 100%   BMI 25.83 kg/m   Physical Exam Vitals signs and nursing note reviewed.  Constitutional:      Appearance: He is well-developed.  HENT:     Head: Normocephalic and atraumatic.  Eyes:     Conjunctiva/sclera: Conjunctivae normal.     Pupils: Pupils are equal, round, and reactive to light.  Neck:     Musculoskeletal: Neck supple.     Thyroid: No thyromegaly.     Trachea: No tracheal deviation.  Cardiovascular:     Rate and Rhythm: Normal rate and regular rhythm.     Heart sounds: No murmur.  Pulmonary:     Effort: Pulmonary effort is normal.     Breath sounds: Normal breath sounds.  Abdominal:     General: Bowel sounds are normal. There is no distension.     Palpations: Abdomen is soft.     Tenderness: There is no abdominal tenderness.  Musculoskeletal: Normal range of motion.        General: No tenderness.     Comments: Left lower extremity bandaged at the foot and heel.  Bandage was removed by me skin is intact.  I appreciate a tiny skin crack overlying the posterior heel.  No surrounding redness swelling or tenderness.  No drainage.  DP pulse 2+.  Good capillary refill   Skin:    General: Skin is warm and dry.     Findings: No rash.  Neurological:     Mental Status: He is alert.     Cranial Nerves: No cranial nerve deficit.     Coordination: Coordination normal.     Comments: Gait normal      ED Treatments / Results  Labs (all labs ordered are listed, but only abnormal results are displayed) Labs Reviewed  COMPREHENSIVE METABOLIC PANEL  ETHANOL  RAPID URINE DRUG SCREEN, HOSP PERFORMED  CBC WITH DIFFERENTIAL/PLATELET    EKG None  Radiology No results found.  Procedures Procedures (including critical care time)  Medications Ordered in ED Medications - No data to  display  Results for orders placed or performed during the hospital encounter of 05/21/18  Ethanol  Result Value Ref Range   Alcohol, Ethyl (B) <10 <10 mg/dL  CBC with Diff  Result Value Ref Range   WBC 9.1 4.0 - 10.5 K/uL   RBC 5.51 4.22 - 5.81 MIL/uL   Hemoglobin 14.9 13.0 - 17.0 g/dL   HCT 48.1 39.0 - 52.0 %   MCV 87.3 80.0 -  100.0 fL   MCH 27.0 26.0 - 34.0 pg   MCHC 31.0 30.0 - 36.0 g/dL   RDW 14.2 11.5 - 15.5 %   Platelets 292 150 - 400 K/uL   nRBC 0.0 0.0 - 0.2 %   Neutrophils Relative % 70 %   Neutro Abs 6.3 1.7 - 7.7 K/uL   Lymphocytes Relative 23 %   Lymphs Abs 2.1 0.7 - 4.0 K/uL   Monocytes Relative 5 %   Monocytes Absolute 0.5 0.1 - 1.0 K/uL   Eosinophils Relative 2 %   Eosinophils Absolute 0.2 0.0 - 0.5 K/uL   Basophils Relative 0 %   Basophils Absolute 0.0 0.0 - 0.1 K/uL   Immature Granulocytes 0 %   Abs Immature Granulocytes 0.04 0.00 - 0.07 K/uL   Ct Abdomen Pelvis W Contrast  Result Date: 05/14/2018 CLINICAL DATA:  Elevated glucose and abdominal pain EXAM: CT ABDOMEN AND PELVIS WITH CONTRAST TECHNIQUE: Multidetector CT imaging of the abdomen and pelvis was performed using the standard protocol following bolus administration of intravenous contrast. CONTRAST:  122mL ISOVUE-300 IOPAMIDOL (ISOVUE-300) INJECTION 61% COMPARISON:  04/27/2018 FINDINGS: Lower chest: No acute abnormality. Hepatobiliary: No focal liver abnormality is seen. Status post cholecystectomy. No biliary dilatation. Pancreas: Unremarkable. No pancreatic ductal dilatation or surrounding inflammatory changes. Spleen: Normal in size without focal abnormality. Adrenals/Urinary Tract: Adrenal glands are within normal limits bilaterally. Kidneys are well visualize without renal calculi or obstructive changes. Small cyst is noted within the right kidney measuring 1 cm. The ureters are within normal limits. Bladder is well distended. Stomach/Bowel: Scattered diverticular changes noted without evidence of  diverticulitis. The appendix is within normal limits. No obstructive or inflammatory bowel abnormality is noted. Stomach is decompressed. Vascular/Lymphatic: No significant vascular findings are present. No enlarged abdominal or pelvic lymph nodes. Reproductive: Prostate is unremarkable. Other: No abdominal wall hernia or abnormality. No abdominopelvic ascites. Musculoskeletal: Degenerative changes of lumbar spine are seen. IMPRESSION: Diverticulosis without diverticulitis. Small right renal cyst. Electronically Signed   By: Inez Catalina M.D.   On: 05/14/2018 21:08   Ct Renal Stone Study  Result Date: 04/27/2018 CLINICAL DATA:  Flank pain with stone disease suspected EXAM: CT ABDOMEN AND PELVIS WITHOUT CONTRAST TECHNIQUE: Multidetector CT imaging of the abdomen and pelvis was performed following the standard protocol without IV contrast. COMPARISON:  None. FINDINGS: Lower chest:  No contributory findings. Hepatobiliary: No focal liver abnormality.Cholecystectomy with normal common bile duct diameter Pancreas: Unremarkable. Spleen: Unremarkable. Adrenals/Urinary Tract: Negative adrenals. No hydronephrosis or stone. 1 cm cystic density in the interpolar right kidney unremarkable bladder. Stomach/Bowel: No obstruction or inflammatory changes. No appendicitis. Vascular/Lymphatic: No acute vascular abnormality. No mass or adenopathy. Reproductive:Negative. Other: No ascites or pneumoperitoneum. Musculoskeletal: No acute abnormalities. 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 Prominent degenerative spurring at the symphysis pubis. T10 and T11 superior endplate depressions with subchondral low-density fracture cleft. There is sclerosis around the fracture margins suggesting subacute timing. These fractures have occurred since 10/10/2017 chest CT. Height loss is mild. IMPRESSION: 1. No acute finding.  No hydronephrosis or urolithiasis. 2. T10 and T11 superior endplate fractures with subacute appearance Electronically  Signed   By: Monte Fantasia M.D.   On: 04/27/2018 18:46   Initial Impression / Assessment and Plan / ED Course  I have reviewed the triage vital signs and the nursing notes.  Pertinent labs & imaging results that were available during my care of the patient were reviewed by me and considered in my medical decision making (see  chart for details).  Clinical Course as of May 22 1551  Thu May 21, 2018  1544 Si no plan, try to recall group home and get more story, ?send back if cant go back then tts   [MB]    Clinical Course User Index [MB] Sedonia Small Barth Kirks, MD    I attempted to call [facility] at 2:40 PM at telephone [phone].  No answer. Pt signed out to Dr. Sedonia Small 350 pm  initilal lab work unremarkable.  Other lab work is pending Final Clinical Impressions(s) / ED Diagnoses  Dx Suicidal ideation Final diagnoses:  None    ED Discharge Orders    None       Orlie Dakin, MD 05/21/18 1555

## 2018-05-21 NOTE — Consult Note (Signed)
Tele psych Assessment   [name], 58 y.o., male patient seen via tele psych by TTS and this provider; chart reviewed and consulted with Dr. Dwyane Dee on 05/21/18.  On evaluation [name] reports that he does not like the group home he is living in "I'm not satisfied there".  States he was sent to [facility] when he was discharged from the [place] hospital.  States that he was in the [place] hospital for a month.  Reports he has been at [place] group home about a month.  Patient sates he was just discharged from psychiatric hospital but he is still having suicidal thoughts.  Patient states that he has no plan or intent.  Patient also states that he is hearing voices telling him to hurt himself after he felt [facility] was going [facility]   Per Dr. Jake Samples discharge summary"  Once here the patient generally stayed to himself he would participate to the extent he could with cognitive based therapies.  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 His sister had phoned hoping he could find some type of long-term treatment for him however he is not acutely dangerous and again we feel his endorsing suicidal thinking is more manipulative than genuine, as well as intermittent endorsing of psychotic symptoms. Each day on rounds he requested to stay longer simply wanting to stay hospitalized rather than go back to his group home for nonspecific reasons.  He would never really expresses hesitancy more fully than that.  During evaluation [name] is   Sitting up in bed; he is alert/oriented x 4; calm/cooperative; and mood congruent with affect.  Patient is speaking in a clear tone at moderate volume, and normal pace; with good eye contact.  His thought process is coherent; and patient does not appear to be responding to internal/external stimuli; but once asked  about hearing voices or seeing thing that other didn't; at this time patient started rocking back and forth and stated he was hearing voice.  Patient denied homicidal ideation at first, then later stated he was homicidal ""people I'm around: would give no further explanation; has no plan or intent; and no access to weapons; but endorses suicidal ideation with no plan or intent and will not elaborate more than saying he is mental.  Patient also endorses auditory hallucination after he felt he was going to be discharged home.  Patient states that he is having paranoia; when asked he stated "about living"  But would give no more explanation when asked.    Patient has remained calm throughout assessment and has answered questions appropriately.     For detailed note see TTS tele assessment note  Recommendations:  Follow up with [facility] as instructed at discharge 2 days ago.  Attempted to call [facility] group home to make sure an appointment was set and to make sure patient medication has been picked up and patient has started taking the medications with changes that were made when in psychiatric hospital.    Disposition: Patient is psychiatrically cleared:  Patient continues to exaggerate symptoms of suicide, homicide, psychosis, and paranoia. But symptoms worsened when started asking about his outpatient follow up.    No evidence of imminent risk to self or others at present.   Supportive therapy provided about ongoing stressors. Discussed crisis plan, support from social network, calling  911, coming to the Emergency Department, and calling Suicide Hotline.    Spoke with Wells Guiles, RN unable to speak with Dr. Sedonia Small at this time.  States she will inform of the above recommendation and disposition.  Earleen Newport, NP

## 2018-05-21 NOTE — ED Notes (Signed)
[place] and stated pt was being discharged and needed transportation back to facility. Facility stated they would call back.

## 2018-05-21 NOTE — ED Notes (Signed)
Pt given dinner tray.

## 2018-05-21 NOTE — BH Assessment (Signed)
Tele Assessment Note   Patient Name: [name] MRN: [id number] Referring Physician: Winfred Leeds [facility] of [facility]:  [facility] Location of Provider: [place] is an 58 y.o. male who presented to [place] stating that he was suicidal, no plan and no previous attempts.  Patient has persistent suicidal ideation as part of his diagnosis and today's claim appears to be for secondary gain because patient states that he does not like the group home that he is living in.  Patient was just discharged from [facility] 2 days ago and his discharge note below summarizes his behaviors that can sometime be manipulative to get what he wants.  Dr. Jake Samples reports:  Once here the patient generally stayed to himself he would participate to the extent he could with cognitive based therapies.  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 His sister had phoned hoping he could find some type of long-term treatment for him however he is not acutely dangerous and again we feel his endorsing suicidal thinking is more manipulative than genuine, as well as intermittent endorsing of psychotic symptoms. Each day on rounds he requested to stay longer simply wanting to stay hospitalized rather than go back to his group home for nonspecific reasons.  He would never really expresses hesitancy more fully than that. By the date of the 31st he was clear he had benefited maximally from med adjustments and inpatient stay.  Added benztropine to help with constipation as he stated he simply could not get to the bathroom in time, we discontinued his Norvasc because of the hypotension he was having, and of course escalated his antidepressant and spread out his Lyrica to lower GI side effects.  Patient states that he is in "bad shape," but could not explain what that meant.  Patient states that, "I am  mental."  Patient states that: "I am not satisfied with the group home, it makes me depressed and suicidal."  Patient could not identify one way that he would hurt himself and he admitted that he always feels suicidal when he is upset, but has never acted on these thoughts.  Patient lives in a group home where he is monitored.  He is supposed to follow-up with the [facility] on an outpatient basis.  Diagnosis: F25.0 Schizo-affective Disorder  Past Medical History:  Past Medical History:  Diagnosis Date  . Anxiety   . Bipolar disorder ([place])   . Chronic pain   . Depression   . Diabetes mellitus without complication ([place])   . Diverticulitis   . GERD (gastroesophageal reflux disease)   . Hypertension   . IBS (irritable bowel syndrome)   . Kidney stone   . Migraine headache   . Neuropathy     Past Surgical History:  Procedure Laterality Date  . ANKLE SURGERY Right   . CARPAL TUNNEL RELEASE    . CHOLECYSTECTOMY    . COLONOSCOPY      Family History:  Family History  Problem Relation Age of Onset  . CAD Mother   . CAD Brother     Social History:  reports that he has never smoked. He has never used smokeless tobacco. He reports previous drug use. He reports that he does not drink alcohol.  Additional Social History:  Alcohol / Drug Use Pain Medications: see MAR Prescriptions: see MAR Over the Counter: see MAR History of alcohol / drug  use?: No history of alcohol / drug abuse  CIWA: CIWA-Ar BP: (!) 152/94 Pulse Rate: 89 COWS:    Allergies:  Allergies  Allergen Reactions  . Meloxicam Other (See Comments)    Acid Reflux   . Venlafaxine Other (See Comments)    Acid Reflux  . Penicillins Rash    Has patient had a PCN reaction causing immediate rash, facial/tongue/throat swelling, SOB or lightheadedness with hypotension: Yes Has patient had a PCN reaction causing severe rash involving mucus membranes or skin necrosis: No Has patient had a PCN reaction that required  hospitalization: No Has patient had a PCN reaction occurring within the last 10 years: No If all of the above answers are "NO", then may proceed with Cephalosporin use.     Home Medications: (Not in a hospital admission)   OB/GYN Status:  No LMP for male patient.  General Assessment Data [facility] of Assessment: [facility] TTS Assessment: In system Is this a Tele or Face-to-Face Assessment?: Tele Assessment Is this an Initial Assessment or a Re-assessment for this encounter?: Initial Assessment Patient Accompanied by:: N/A Language Other than English: No Living Arrangements: In Group Home: (Comment: Name of [facility]) What gender do you identify as?: Male Marital status: Single Living Arrangements: Group Home Can pt return to current living arrangement?: Yes Admission Status: Voluntary Is patient capable of signing voluntary admission?: Yes Referral Source: Self/Family/Friend Insurance type: [facility])     [place] Living Arrangements: Group Home Legal Guardian: Other:(unknown) Name of Psychiatrist: (Dr Reystar/ [facility]) Name of Therapist: none  Education Status Is patient currently in school?: No Is the patient employed, unemployed or receiving disability?: Receiving disability income  Risk to self with the past 6 months Suicidal Ideation: Yes-Currently Present Has patient been a risk to self within the past 6 months prior to admission? : No Suicidal Intent: No Has patient had any suicidal intent within the past 6 months prior to admission? : No Is patient at risk for suicide?: No Suicidal Plan?: No Has patient had any suicidal plan within the past 6 months prior to admission? : No Access to Means: No Specify Access to Suicidal Means: (none) What has been your use of drugs/alcohol within the last 12 months?: (noner) Previous Attempts/Gestures: No How many times?: (0) Other Self Harm Risks: none Triggers for Past Attempts: None  known Intentional Self Injurious Behavior: None Family Suicide History: No Recent stressful life event(s): Other (Comment)(none) Persecutory voices/beliefs?: No Depression: Yes Depression Symptoms: Despondent, Isolating, Loss of interest in usual pleasures, Feeling worthless/self pity Substance abuse history and/or treatment for substance abuse?: No Suicide prevention information given to non-admitted patients: Yes  Risk to Others within the past 6 months Homicidal Ideation: No Does patient have any lifetime risk of violence toward others beyond the six months prior to admission? : No Thoughts of Harm to Others: No Current Homicidal Intent: No Current Homicidal Plan: No Access to Homicidal Means: No Identified Victim: none History of harm to others?: No Assessment of Violence: None Noted Violent Behavior Description: none Does patient have access to weapons?: No Criminal Charges Pending?: No Does patient have a court date: No Is patient on probation?: No  Psychosis Hallucinations: Auditory(hears voices to hurt himself)  Mental Status Report Appearance/Hygiene: Body odor, Disheveled Eye Contact: Fair Motor Activity: Restlessness Speech: Pressured Level of Consciousness: Alert, Restless Mood: Depressed, Anxious Affect: Anxious Anxiety Level: Severe Thought Processes: Coherent, Relevant Judgement: Impaired Orientation: Person, Place, Time, Situation Obsessive Compulsive Thoughts/Behaviors: (persistent suicidal thoughts)  Cognitive Functioning Concentration: Decreased Memory: Recent Intact, Remote Intact Is patient IDD: Yes Insight: Poor Impulse Control: Poor Appetite: Good Sleep: No Change  ADLScreening [facility] Assessment Services) Patient's cognitive ability adequate to safely complete daily activities?: Yes Patient able to express need for assistance with ADLs?: Yes Independently performs ADLs?: Yes (appropriate for developmental age)  Prior Inpatient  Therapy Prior Inpatient Therapy: Yes Prior Therapy Dates: (just released from [facility] 2 days ago) Prior Therapy Facilty/Provider(s): [facility] Reason for Treatment: Depression and suicidal  Prior Outpatient Therapy Prior Outpatient Therapy: Yes Prior Therapy Dates: active Prior Therapy Facilty/Provider(s): [facility] Reason for Treatment: depression Does patient have an ACCT team?: No Does patient have Intensive In-House Services?  : No Does patient have [facility] services? : No Does patient have P4CC services?: No  ADL Screening (condition at time of admission) Patient's cognitive ability adequate to safely complete daily activities?: Yes Is the patient deaf or have difficulty hearing?: No Does the patient have difficulty seeing, even when wearing glasses/contacts?: No Does the patient have difficulty concentrating, remembering, or making decisions?: No Patient able to express need for assistance with ADLs?: Yes Does the patient have difficulty dressing or bathing?: No Independently performs ADLs?: Yes (appropriate for developmental age) Does the patient have difficulty walking or climbing stairs?: No Weakness of Legs: None Weakness of Arms/Hands: None  Home Assistive Devices/Equipment Home Assistive Devices/Equipment: None  Therapy Consults (therapy consults require a physician order) PT Evaluation Needed: No OT Evalulation Needed: No SLP Evaluation Needed: No Abuse/Neglect Assessment (Assessment to be complete while patient is alone) Abuse/Neglect Assessment Can Be Completed: Yes Physical Abuse: Denies Verbal Abuse: Denies Sexual Abuse: Denies Self-Neglect: Denies Values / Beliefs Cultural Requests During Hospitalization: None Spiritual Requests During Hospitalization: None Consults Spiritual Care Consult Needed: No Social Work Consult Needed: No Regulatory affairs officer (For Healthcare) Does Patient Have a Medical Advance Directive?: No Would patient like information on creating a  medical advance directive?: No - Patient declined Nutrition Screen- [facility] Adult/[facility] Has the patient recently lost weight without trying?: No Has the patient been eating poorly because of a decreased appetite?: No Malnutrition Screening Tool Score: 0        Disposition: Patient has been psych cleared to return to the [facility] by Earleen Newport, NP Disposition Initial Assessment Completed for this Encounter: Yes  This service was provided via telemedicine using a 2-way, interactive audio and video technology.  Names of all persons participating in this telemedicine service and their role in this encounter. Name: [name] Role: patient  Name: Kasandra Knudsen Alexsis Kathman Role: TTS  Name: Earleen Newport Role: FNP  Name:  Role:     Reatha Armour 05/21/2018 6:26 PM

## 2018-05-22 DIAGNOSIS — F419 Anxiety disorder, unspecified: Secondary | ICD-10-CM | POA: Diagnosis not present

## 2018-05-22 DIAGNOSIS — R9431 Abnormal electrocardiogram [ECG] [EKG]: Secondary | ICD-10-CM | POA: Diagnosis not present

## 2018-05-22 DIAGNOSIS — F319 Bipolar disorder, unspecified: Secondary | ICD-10-CM | POA: Diagnosis not present

## 2018-08-03 DIAGNOSIS — E1165 Type 2 diabetes mellitus with hyperglycemia: Secondary | ICD-10-CM | POA: Diagnosis not present

## 2018-08-03 DIAGNOSIS — R52 Pain, unspecified: Secondary | ICD-10-CM | POA: Diagnosis not present

## 2018-08-03 DIAGNOSIS — R1084 Generalized abdominal pain: Secondary | ICD-10-CM | POA: Diagnosis not present

## 2018-08-03 DIAGNOSIS — R000 Tachycardia, unspecified: Secondary | ICD-10-CM | POA: Diagnosis not present

## 2018-08-03 DIAGNOSIS — R0902 Hypoxemia: Secondary | ICD-10-CM | POA: Diagnosis not present

## 2018-08-15 DIAGNOSIS — R1013 Epigastric pain: Secondary | ICD-10-CM | POA: Diagnosis not present

## 2018-08-15 DIAGNOSIS — R000 Tachycardia, unspecified: Secondary | ICD-10-CM | POA: Diagnosis not present

## 2018-08-15 DIAGNOSIS — K760 Fatty (change of) liver, not elsewhere classified: Secondary | ICD-10-CM | POA: Diagnosis not present

## 2018-08-15 DIAGNOSIS — K449 Diaphragmatic hernia without obstruction or gangrene: Secondary | ICD-10-CM | POA: Diagnosis not present

## 2018-08-15 DIAGNOSIS — R0602 Shortness of breath: Secondary | ICD-10-CM | POA: Diagnosis not present

## 2018-08-15 DIAGNOSIS — R112 Nausea with vomiting, unspecified: Secondary | ICD-10-CM | POA: Diagnosis not present

## 2018-08-15 DIAGNOSIS — N3289 Other specified disorders of bladder: Secondary | ICD-10-CM | POA: Diagnosis not present

## 2018-08-15 DIAGNOSIS — E1165 Type 2 diabetes mellitus with hyperglycemia: Secondary | ICD-10-CM | POA: Diagnosis not present

## 2018-08-15 DIAGNOSIS — R109 Unspecified abdominal pain: Secondary | ICD-10-CM | POA: Diagnosis not present

## 2018-08-15 DIAGNOSIS — R531 Weakness: Secondary | ICD-10-CM | POA: Diagnosis not present

## 2018-08-15 DIAGNOSIS — Z87891 Personal history of nicotine dependence: Secondary | ICD-10-CM | POA: Diagnosis not present

## 2018-08-15 DIAGNOSIS — M549 Dorsalgia, unspecified: Secondary | ICD-10-CM | POA: Diagnosis not present

## 2018-08-15 DIAGNOSIS — R05 Cough: Secondary | ICD-10-CM | POA: Diagnosis not present

## 2018-08-15 DIAGNOSIS — Z794 Long term (current) use of insulin: Secondary | ICD-10-CM | POA: Diagnosis not present

## 2018-08-15 DIAGNOSIS — I10 Essential (primary) hypertension: Secondary | ICD-10-CM | POA: Diagnosis not present

## 2018-08-15 DIAGNOSIS — Z8719 Personal history of other diseases of the digestive system: Secondary | ICD-10-CM | POA: Diagnosis not present

## 2018-08-16 DIAGNOSIS — R739 Hyperglycemia, unspecified: Secondary | ICD-10-CM | POA: Diagnosis not present

## 2018-08-17 DIAGNOSIS — R739 Hyperglycemia, unspecified: Secondary | ICD-10-CM | POA: Diagnosis not present

## 2018-08-18 DIAGNOSIS — K5900 Constipation, unspecified: Secondary | ICD-10-CM | POA: Diagnosis not present

## 2018-08-18 DIAGNOSIS — R109 Unspecified abdominal pain: Secondary | ICD-10-CM | POA: Diagnosis not present

## 2018-08-18 DIAGNOSIS — R739 Hyperglycemia, unspecified: Secondary | ICD-10-CM | POA: Diagnosis not present

## 2018-08-19 DIAGNOSIS — R739 Hyperglycemia, unspecified: Secondary | ICD-10-CM | POA: Diagnosis not present

## 2018-08-25 DIAGNOSIS — R0602 Shortness of breath: Secondary | ICD-10-CM | POA: Diagnosis not present

## 2018-08-25 DIAGNOSIS — E1165 Type 2 diabetes mellitus with hyperglycemia: Secondary | ICD-10-CM | POA: Diagnosis not present

## 2018-08-25 DIAGNOSIS — Z765 Malingerer [conscious simulation]: Secondary | ICD-10-CM | POA: Diagnosis not present

## 2018-08-25 DIAGNOSIS — R4182 Altered mental status, unspecified: Secondary | ICD-10-CM | POA: Diagnosis not present

## 2018-08-25 DIAGNOSIS — I10 Essential (primary) hypertension: Secondary | ICD-10-CM | POA: Diagnosis not present

## 2018-08-25 DIAGNOSIS — R109 Unspecified abdominal pain: Secondary | ICD-10-CM | POA: Diagnosis not present

## 2018-08-25 DIAGNOSIS — F329 Major depressive disorder, single episode, unspecified: Secondary | ICD-10-CM | POA: Diagnosis not present

## 2018-08-25 DIAGNOSIS — R45851 Suicidal ideations: Secondary | ICD-10-CM | POA: Diagnosis not present

## 2018-08-25 DIAGNOSIS — R079 Chest pain, unspecified: Secondary | ICD-10-CM | POA: Diagnosis not present

## 2018-08-25 DIAGNOSIS — Z794 Long term (current) use of insulin: Secondary | ICD-10-CM | POA: Diagnosis not present

## 2018-08-25 DIAGNOSIS — R069 Unspecified abnormalities of breathing: Secondary | ICD-10-CM | POA: Diagnosis not present

## 2018-08-25 DIAGNOSIS — R4689 Other symptoms and signs involving appearance and behavior: Secondary | ICD-10-CM | POA: Diagnosis not present

## 2018-08-25 DIAGNOSIS — R112 Nausea with vomiting, unspecified: Secondary | ICD-10-CM | POA: Diagnosis not present

## 2018-08-25 DIAGNOSIS — G8929 Other chronic pain: Secondary | ICD-10-CM | POA: Diagnosis not present

## 2018-08-30 DIAGNOSIS — R0602 Shortness of breath: Secondary | ICD-10-CM | POA: Diagnosis not present

## 2018-08-30 DIAGNOSIS — Z794 Long term (current) use of insulin: Secondary | ICD-10-CM | POA: Diagnosis not present

## 2018-08-30 DIAGNOSIS — R112 Nausea with vomiting, unspecified: Secondary | ICD-10-CM | POA: Diagnosis not present

## 2018-08-30 DIAGNOSIS — R069 Unspecified abnormalities of breathing: Secondary | ICD-10-CM | POA: Diagnosis not present

## 2018-08-30 DIAGNOSIS — F329 Major depressive disorder, single episode, unspecified: Secondary | ICD-10-CM | POA: Diagnosis not present

## 2018-08-30 DIAGNOSIS — R109 Unspecified abdominal pain: Secondary | ICD-10-CM | POA: Diagnosis not present

## 2018-08-30 DIAGNOSIS — R1084 Generalized abdominal pain: Secondary | ICD-10-CM | POA: Diagnosis not present

## 2018-08-30 DIAGNOSIS — E1142 Type 2 diabetes mellitus with diabetic polyneuropathy: Secondary | ICD-10-CM | POA: Diagnosis not present

## 2018-08-30 DIAGNOSIS — Z880 Allergy status to penicillin: Secondary | ICD-10-CM | POA: Diagnosis not present

## 2018-08-30 DIAGNOSIS — Z886 Allergy status to analgesic agent status: Secondary | ICD-10-CM | POA: Diagnosis not present

## 2018-08-30 DIAGNOSIS — Z888 Allergy status to other drugs, medicaments and biological substances status: Secondary | ICD-10-CM | POA: Diagnosis not present

## 2018-08-30 DIAGNOSIS — E1165 Type 2 diabetes mellitus with hyperglycemia: Secondary | ICD-10-CM | POA: Diagnosis not present

## 2018-08-30 DIAGNOSIS — Z87891 Personal history of nicotine dependence: Secondary | ICD-10-CM | POA: Diagnosis not present

## 2018-08-30 DIAGNOSIS — Z79899 Other long term (current) drug therapy: Secondary | ICD-10-CM | POA: Diagnosis not present

## 2018-08-30 DIAGNOSIS — I10 Essential (primary) hypertension: Secondary | ICD-10-CM | POA: Diagnosis not present

## 2018-08-30 DIAGNOSIS — R52 Pain, unspecified: Secondary | ICD-10-CM | POA: Diagnosis not present

## 2018-08-31 DIAGNOSIS — R112 Nausea with vomiting, unspecified: Secondary | ICD-10-CM | POA: Diagnosis not present

## 2018-08-31 DIAGNOSIS — E1165 Type 2 diabetes mellitus with hyperglycemia: Secondary | ICD-10-CM | POA: Diagnosis not present

## 2018-08-31 DIAGNOSIS — F333 Major depressive disorder, recurrent, severe with psychotic symptoms: Secondary | ICD-10-CM | POA: Diagnosis not present

## 2018-08-31 DIAGNOSIS — R419 Unspecified symptoms and signs involving cognitive functions and awareness: Secondary | ICD-10-CM | POA: Diagnosis not present

## 2018-08-31 DIAGNOSIS — R45851 Suicidal ideations: Secondary | ICD-10-CM | POA: Diagnosis not present

## 2018-08-31 DIAGNOSIS — R0602 Shortness of breath: Secondary | ICD-10-CM | POA: Diagnosis not present

## 2018-08-31 DIAGNOSIS — R110 Nausea: Secondary | ICD-10-CM | POA: Diagnosis not present

## 2018-08-31 DIAGNOSIS — F459 Somatoform disorder, unspecified: Secondary | ICD-10-CM | POA: Diagnosis not present

## 2018-08-31 DIAGNOSIS — R069 Unspecified abnormalities of breathing: Secondary | ICD-10-CM | POA: Diagnosis not present

## 2018-08-31 DIAGNOSIS — R197 Diarrhea, unspecified: Secondary | ICD-10-CM | POA: Diagnosis not present

## 2018-08-31 DIAGNOSIS — Z765 Malingerer [conscious simulation]: Secondary | ICD-10-CM | POA: Diagnosis not present

## 2018-10-16 DIAGNOSIS — I10 Essential (primary) hypertension: Secondary | ICD-10-CM | POA: Diagnosis not present

## 2018-10-16 DIAGNOSIS — E279 Disorder of adrenal gland, unspecified: Secondary | ICD-10-CM | POA: Diagnosis not present

## 2018-10-16 DIAGNOSIS — R1011 Right upper quadrant pain: Secondary | ICD-10-CM | POA: Diagnosis not present

## 2018-10-16 DIAGNOSIS — Z9049 Acquired absence of other specified parts of digestive tract: Secondary | ICD-10-CM | POA: Diagnosis not present

## 2018-10-16 DIAGNOSIS — N281 Cyst of kidney, acquired: Secondary | ICD-10-CM | POA: Diagnosis not present

## 2018-10-16 DIAGNOSIS — R112 Nausea with vomiting, unspecified: Secondary | ICD-10-CM | POA: Diagnosis not present

## 2018-10-16 DIAGNOSIS — R197 Diarrhea, unspecified: Secondary | ICD-10-CM | POA: Diagnosis not present

## 2018-10-18 DIAGNOSIS — R1084 Generalized abdominal pain: Secondary | ICD-10-CM | POA: Diagnosis not present

## 2018-10-18 DIAGNOSIS — R1011 Right upper quadrant pain: Secondary | ICD-10-CM | POA: Diagnosis not present

## 2018-10-18 DIAGNOSIS — R45851 Suicidal ideations: Secondary | ICD-10-CM | POA: Diagnosis not present

## 2018-10-18 DIAGNOSIS — G8929 Other chronic pain: Secondary | ICD-10-CM | POA: Diagnosis not present

## 2018-10-18 DIAGNOSIS — F329 Major depressive disorder, single episode, unspecified: Secondary | ICD-10-CM | POA: Diagnosis not present

## 2018-10-18 DIAGNOSIS — M19072 Primary osteoarthritis, left ankle and foot: Secondary | ICD-10-CM | POA: Diagnosis not present

## 2018-10-18 DIAGNOSIS — Y999 Unspecified external cause status: Secondary | ICD-10-CM | POA: Diagnosis not present

## 2018-10-18 DIAGNOSIS — F29 Unspecified psychosis not due to a substance or known physiological condition: Secondary | ICD-10-CM | POA: Diagnosis not present

## 2018-10-18 DIAGNOSIS — Z046 Encounter for general psychiatric examination, requested by authority: Secondary | ICD-10-CM | POA: Diagnosis not present

## 2018-10-18 DIAGNOSIS — E119 Type 2 diabetes mellitus without complications: Secondary | ICD-10-CM | POA: Diagnosis not present

## 2018-10-18 DIAGNOSIS — R112 Nausea with vomiting, unspecified: Secondary | ICD-10-CM | POA: Diagnosis not present

## 2018-10-18 DIAGNOSIS — X58XXXA Exposure to other specified factors, initial encounter: Secondary | ICD-10-CM | POA: Diagnosis not present

## 2018-10-18 DIAGNOSIS — S99922A Unspecified injury of left foot, initial encounter: Secondary | ICD-10-CM | POA: Diagnosis not present

## 2018-10-18 DIAGNOSIS — R110 Nausea: Secondary | ICD-10-CM | POA: Diagnosis not present

## 2018-10-18 DIAGNOSIS — M7989 Other specified soft tissue disorders: Secondary | ICD-10-CM | POA: Diagnosis not present

## 2018-10-19 DIAGNOSIS — R1084 Generalized abdominal pain: Secondary | ICD-10-CM | POA: Diagnosis not present

## 2018-10-19 DIAGNOSIS — S99922A Unspecified injury of left foot, initial encounter: Secondary | ICD-10-CM | POA: Diagnosis not present

## 2018-10-19 DIAGNOSIS — F329 Major depressive disorder, single episode, unspecified: Secondary | ICD-10-CM | POA: Diagnosis not present

## 2018-10-19 DIAGNOSIS — F29 Unspecified psychosis not due to a substance or known physiological condition: Secondary | ICD-10-CM | POA: Diagnosis not present

## 2018-10-19 DIAGNOSIS — X58XXXA Exposure to other specified factors, initial encounter: Secondary | ICD-10-CM | POA: Diagnosis not present

## 2018-10-19 DIAGNOSIS — Y999 Unspecified external cause status: Secondary | ICD-10-CM | POA: Diagnosis not present

## 2018-10-19 DIAGNOSIS — R45851 Suicidal ideations: Secondary | ICD-10-CM | POA: Diagnosis not present

## 2019-01-30 IMAGING — CT CT ABD-PELV W/ CM
2 of 5 series · 17 of 46 positions shown, 19 images · IV contrast (Isovue)
Comparison: 04/27/2018

CLINICAL DATA: Elevated glucose and abdominal pain

EXAM:
CT ABDOMEN AND PELVIS WITH CONTRAST
TECHNIQUE: Multidetector CT imaging of the abdomen and pelvis was performed
using the standard protocol following bolus administration of
intravenous contrast.
CONTRAST:  100mL [id number] IOPAMIDOL ([id number]) INJECTION 61%

[Series 2: axial st · axial · 0.82mm/px · z∈[+790,+1205]mm · 14 of 95 slices shown, 16 images]
[im 6/95  soft-tissue]
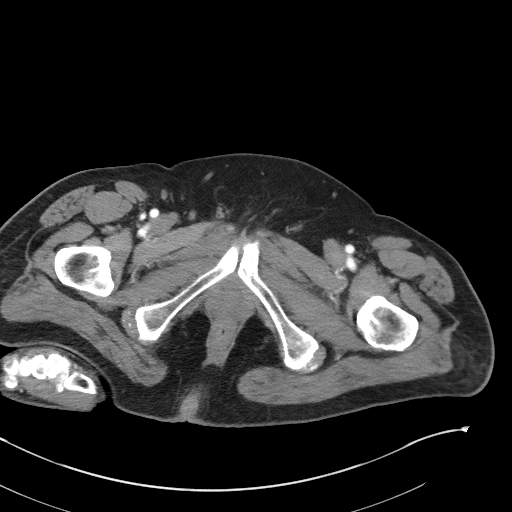
[im 6/95  bone]
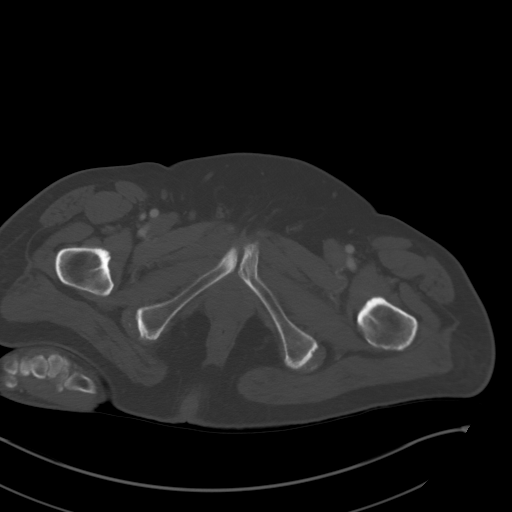
[im 12/95  soft-tissue]
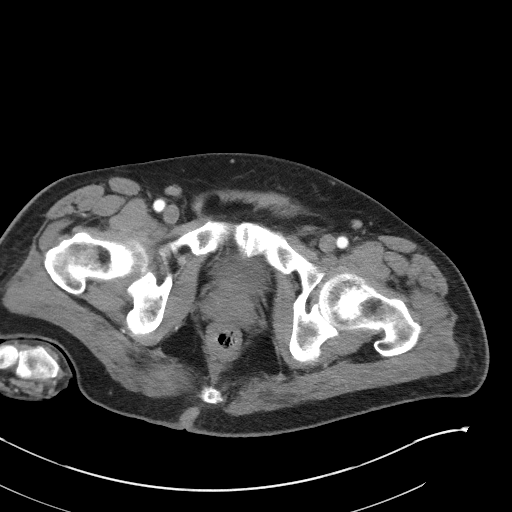
[im 17/95  soft-tissue]
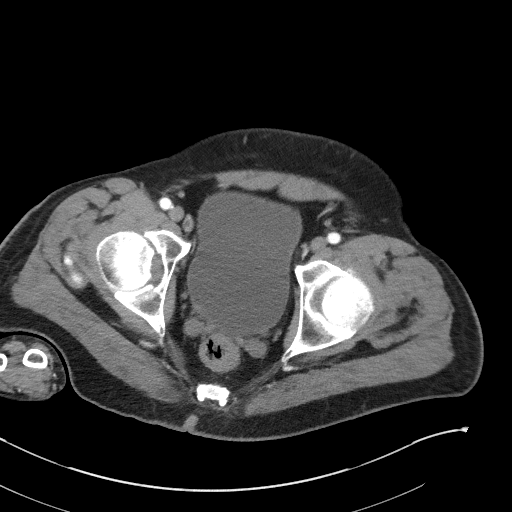
[im 28/95  soft-tissue]
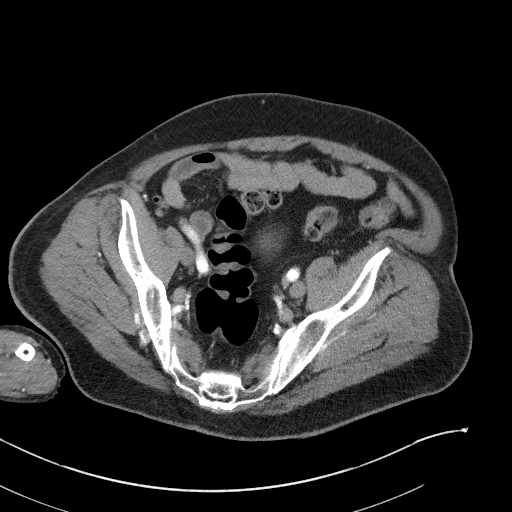
[im 34/95  soft-tissue]
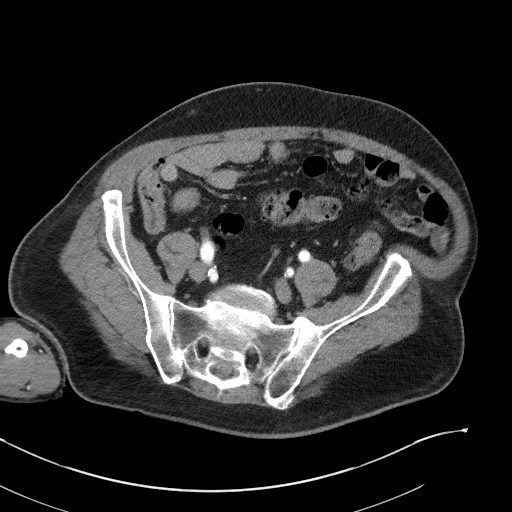
[im 39/95  soft-tissue]
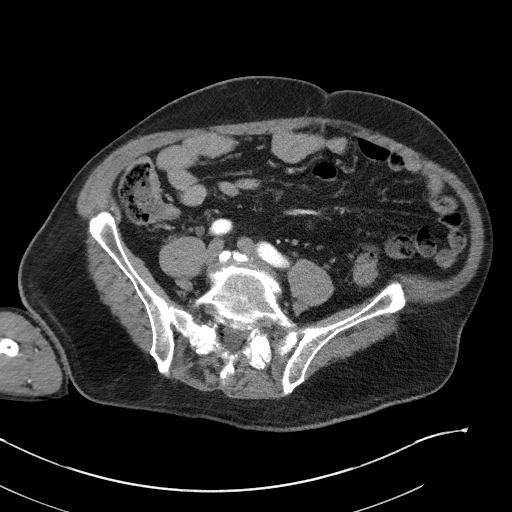
[im 45/95  soft-tissue]
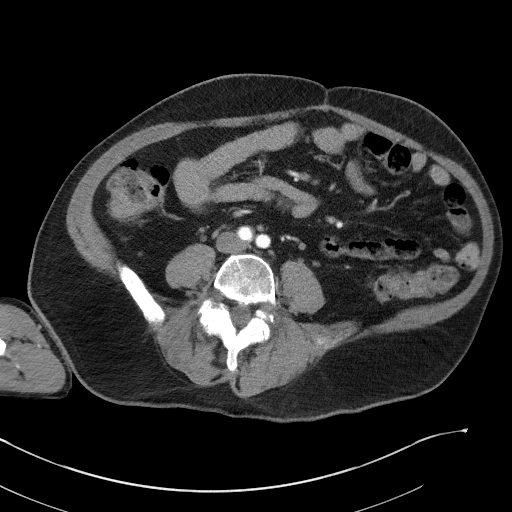
[im 50/95  soft-tissue]
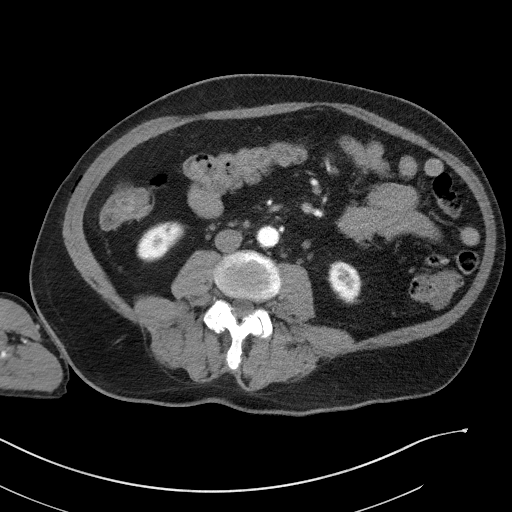
[im 56/95  soft-tissue]
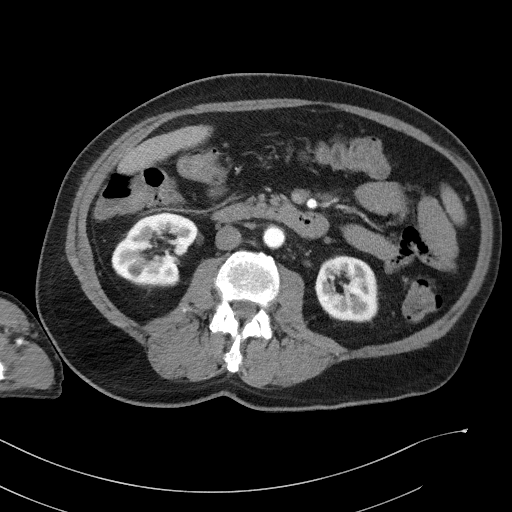
[im 56/95  bone]
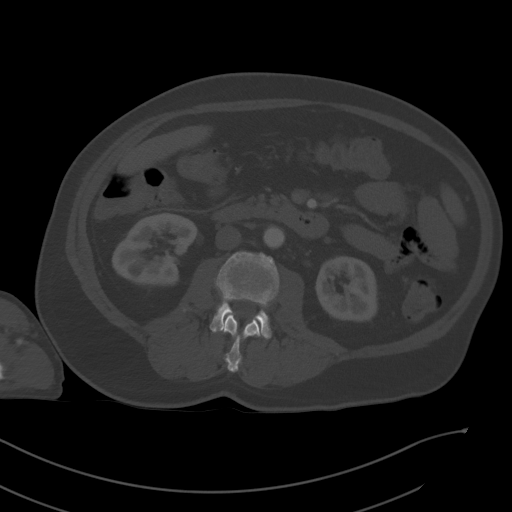
[im 61/95  soft-tissue]
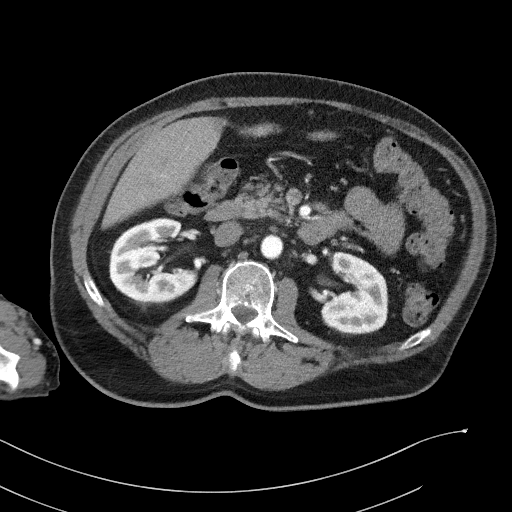
[im 72/95  soft-tissue]
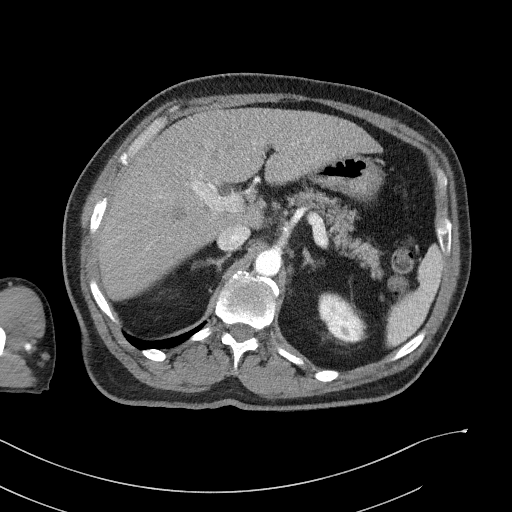
[im 78/95  soft-tissue]
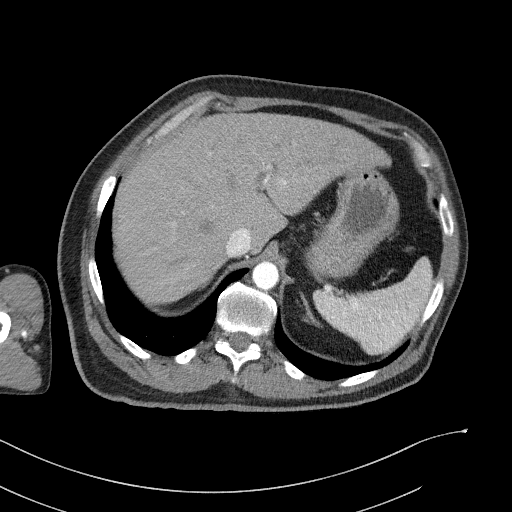
[im 83/95  soft-tissue]
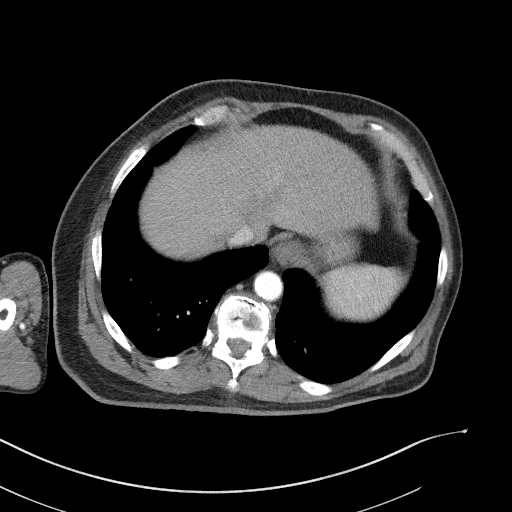
[im 89/95  soft-tissue]
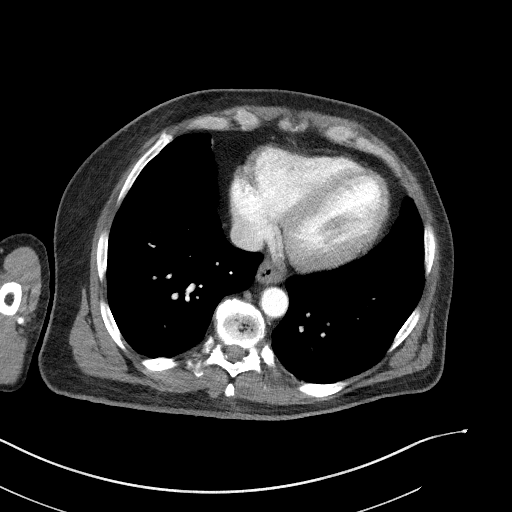

[Series 6: coronal st · coronal · 0.83mm/px · 3 of 117 slices shown]
[im 39/117  soft-tissue]
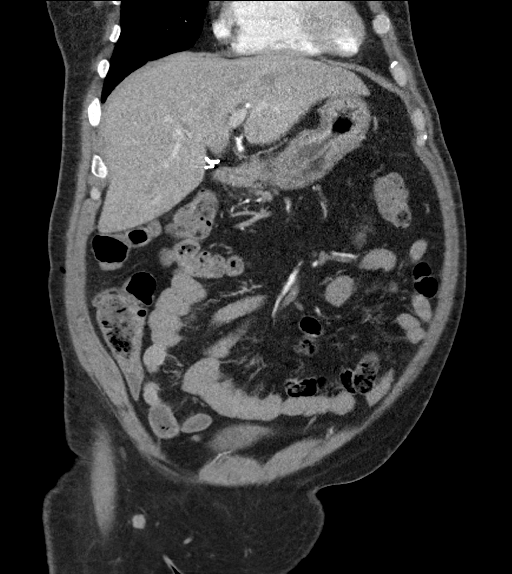
[im 52/117  soft-tissue]
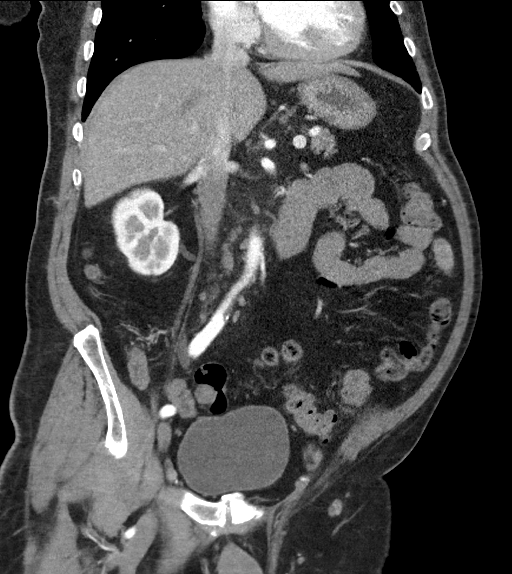
[im 65/117  soft-tissue]
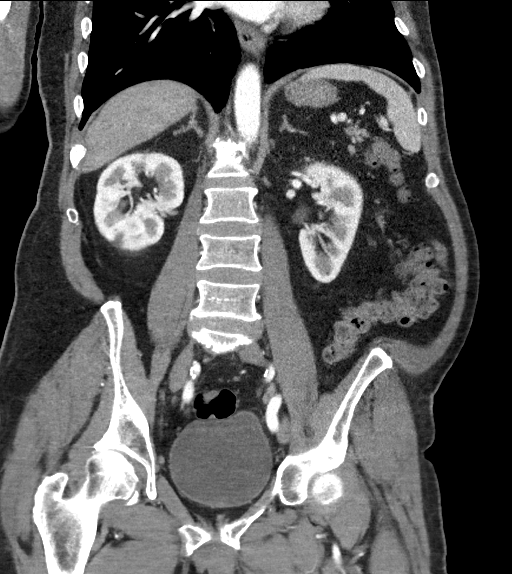

[17 of 46 positions shown; findings below may reference images not displayed]

FINDINGS: Lower chest: No acute abnormality.

Hepatobiliary: No focal liver abnormality is seen. Status post
cholecystectomy. No biliary dilatation.

Pancreas: Unremarkable. No pancreatic ductal dilatation or
surrounding inflammatory changes.

Spleen: Normal in size without focal abnormality.

Adrenals/Urinary Tract: Adrenal glands are within normal limits
bilaterally. Kidneys are well visualize without renal calculi or
obstructive changes. Small cyst is noted within the right kidney
measuring 1 cm. The ureters are within normal limits. Bladder is
well distended.

Stomach/Bowel: Scattered diverticular changes noted without evidence
of diverticulitis. The appendix is within normal limits. No
obstructive or inflammatory bowel abnormality is noted. Stomach is
decompressed.

Vascular/Lymphatic: No significant vascular findings are present. No
enlarged abdominal or pelvic lymph nodes.

Reproductive: Prostate is unremarkable.

Other: No abdominal wall hernia or abnormality. No abdominopelvic
ascites.

Musculoskeletal: Degenerative changes of lumbar spine are seen.
IMPRESSION: Diverticulosis without diverticulitis.

Small right renal cyst.

## 2020-09-19 DIAGNOSIS — I214 Non-ST elevation (NSTEMI) myocardial infarction: Secondary | ICD-10-CM | POA: Diagnosis not present

## 2020-09-19 DIAGNOSIS — Z01811 Encounter for preprocedural respiratory examination: Secondary | ICD-10-CM | POA: Diagnosis not present

## 2020-09-19 DIAGNOSIS — E1142 Type 2 diabetes mellitus with diabetic polyneuropathy: Secondary | ICD-10-CM | POA: Diagnosis not present

## 2020-09-19 DIAGNOSIS — Z794 Long term (current) use of insulin: Secondary | ICD-10-CM | POA: Diagnosis not present

## 2020-09-19 DIAGNOSIS — R0602 Shortness of breath: Secondary | ICD-10-CM | POA: Diagnosis not present

## 2020-09-19 DIAGNOSIS — K5909 Other constipation: Secondary | ICD-10-CM | POA: Diagnosis not present

## 2020-09-19 DIAGNOSIS — R339 Retention of urine, unspecified: Secondary | ICD-10-CM | POA: Diagnosis not present

## 2020-09-19 DIAGNOSIS — Z886 Allergy status to analgesic agent status: Secondary | ICD-10-CM | POA: Diagnosis not present

## 2020-09-19 DIAGNOSIS — H538 Other visual disturbances: Secondary | ICD-10-CM | POA: Diagnosis not present

## 2020-09-19 DIAGNOSIS — K861 Other chronic pancreatitis: Secondary | ICD-10-CM | POA: Diagnosis not present

## 2020-09-19 DIAGNOSIS — I110 Hypertensive heart disease with heart failure: Secondary | ICD-10-CM | POA: Diagnosis not present

## 2020-09-19 DIAGNOSIS — Z01818 Encounter for other preprocedural examination: Secondary | ICD-10-CM | POA: Diagnosis not present

## 2020-09-19 DIAGNOSIS — I6521 Occlusion and stenosis of right carotid artery: Secondary | ICD-10-CM | POA: Diagnosis not present

## 2020-09-19 DIAGNOSIS — I5022 Chronic systolic (congestive) heart failure: Secondary | ICD-10-CM | POA: Diagnosis not present

## 2020-09-19 DIAGNOSIS — K219 Gastro-esophageal reflux disease without esophagitis: Secondary | ICD-10-CM | POA: Diagnosis not present

## 2020-09-19 DIAGNOSIS — Z880 Allergy status to penicillin: Secondary | ICD-10-CM | POA: Diagnosis not present

## 2020-09-19 DIAGNOSIS — F32A Depression, unspecified: Secondary | ICD-10-CM | POA: Diagnosis not present

## 2020-09-19 DIAGNOSIS — R918 Other nonspecific abnormal finding of lung field: Secondary | ICD-10-CM | POA: Diagnosis not present

## 2020-09-19 DIAGNOSIS — I255 Ischemic cardiomyopathy: Secondary | ICD-10-CM | POA: Diagnosis not present

## 2020-09-19 DIAGNOSIS — R1084 Generalized abdominal pain: Secondary | ICD-10-CM | POA: Diagnosis not present

## 2020-09-19 DIAGNOSIS — N179 Acute kidney failure, unspecified: Secondary | ICD-10-CM | POA: Diagnosis not present

## 2020-09-19 DIAGNOSIS — R55 Syncope and collapse: Secondary | ICD-10-CM | POA: Diagnosis not present

## 2020-09-19 DIAGNOSIS — I10 Essential (primary) hypertension: Secondary | ICD-10-CM | POA: Diagnosis not present

## 2020-09-19 DIAGNOSIS — Z20822 Contact with and (suspected) exposure to covid-19: Secondary | ICD-10-CM | POA: Diagnosis not present

## 2020-09-19 DIAGNOSIS — R0600 Dyspnea, unspecified: Secondary | ICD-10-CM | POA: Diagnosis not present

## 2020-09-19 DIAGNOSIS — I340 Nonrheumatic mitral (valve) insufficiency: Secondary | ICD-10-CM | POA: Diagnosis not present

## 2020-09-19 DIAGNOSIS — Z87891 Personal history of nicotine dependence: Secondary | ICD-10-CM | POA: Diagnosis not present

## 2020-09-19 DIAGNOSIS — E119 Type 2 diabetes mellitus without complications: Secondary | ICD-10-CM | POA: Diagnosis not present

## 2020-09-19 DIAGNOSIS — J9811 Atelectasis: Secondary | ICD-10-CM | POA: Diagnosis not present

## 2020-09-19 DIAGNOSIS — E875 Hyperkalemia: Secondary | ICD-10-CM | POA: Diagnosis not present

## 2020-09-19 DIAGNOSIS — Z79899 Other long term (current) drug therapy: Secondary | ICD-10-CM | POA: Diagnosis not present

## 2020-09-19 DIAGNOSIS — I959 Hypotension, unspecified: Secondary | ICD-10-CM | POA: Diagnosis not present

## 2020-09-19 DIAGNOSIS — I081 Rheumatic disorders of both mitral and tricuspid valves: Secondary | ICD-10-CM | POA: Diagnosis not present

## 2020-09-19 DIAGNOSIS — E782 Mixed hyperlipidemia: Secondary | ICD-10-CM | POA: Diagnosis not present

## 2020-09-19 DIAGNOSIS — I493 Ventricular premature depolarization: Secondary | ICD-10-CM | POA: Diagnosis not present

## 2020-09-19 DIAGNOSIS — I213 ST elevation (STEMI) myocardial infarction of unspecified site: Secondary | ICD-10-CM | POA: Diagnosis not present

## 2020-09-19 DIAGNOSIS — I4581 Long QT syndrome: Secondary | ICD-10-CM | POA: Diagnosis not present

## 2020-09-19 DIAGNOSIS — R0789 Other chest pain: Secondary | ICD-10-CM | POA: Diagnosis not present

## 2020-09-19 DIAGNOSIS — Z888 Allergy status to other drugs, medicaments and biological substances status: Secondary | ICD-10-CM | POA: Diagnosis not present

## 2020-09-19 DIAGNOSIS — T45525A Adverse effect of antithrombotic drugs, initial encounter: Secondary | ICD-10-CM | POA: Diagnosis not present

## 2020-09-19 DIAGNOSIS — Z8616 Personal history of COVID-19: Secondary | ICD-10-CM | POA: Diagnosis not present

## 2020-09-19 DIAGNOSIS — Z7982 Long term (current) use of aspirin: Secondary | ICD-10-CM | POA: Diagnosis not present

## 2020-09-19 DIAGNOSIS — E1140 Type 2 diabetes mellitus with diabetic neuropathy, unspecified: Secondary | ICD-10-CM | POA: Diagnosis not present

## 2020-09-19 DIAGNOSIS — I25110 Atherosclerotic heart disease of native coronary artery with unstable angina pectoris: Secondary | ICD-10-CM | POA: Diagnosis not present

## 2020-09-19 DIAGNOSIS — I2510 Atherosclerotic heart disease of native coronary artery without angina pectoris: Secondary | ICD-10-CM | POA: Diagnosis not present

## 2020-09-19 DIAGNOSIS — R079 Chest pain, unspecified: Secondary | ICD-10-CM | POA: Diagnosis not present

## 2020-10-03 DIAGNOSIS — F32A Depression, unspecified: Secondary | ICD-10-CM | POA: Diagnosis not present

## 2020-10-03 DIAGNOSIS — Z888 Allergy status to other drugs, medicaments and biological substances status: Secondary | ICD-10-CM | POA: Diagnosis not present

## 2020-10-03 DIAGNOSIS — Z79899 Other long term (current) drug therapy: Secondary | ICD-10-CM | POA: Diagnosis not present

## 2020-10-03 DIAGNOSIS — Z886 Allergy status to analgesic agent status: Secondary | ICD-10-CM | POA: Diagnosis not present

## 2020-10-03 DIAGNOSIS — I2510 Atherosclerotic heart disease of native coronary artery without angina pectoris: Secondary | ICD-10-CM | POA: Diagnosis not present

## 2020-10-03 DIAGNOSIS — Z7982 Long term (current) use of aspirin: Secondary | ICD-10-CM | POA: Diagnosis not present

## 2020-10-03 DIAGNOSIS — Z87891 Personal history of nicotine dependence: Secondary | ICD-10-CM | POA: Diagnosis not present

## 2020-10-03 DIAGNOSIS — Z880 Allergy status to penicillin: Secondary | ICD-10-CM | POA: Diagnosis not present

## 2020-10-03 DIAGNOSIS — I10 Essential (primary) hypertension: Secondary | ICD-10-CM | POA: Diagnosis not present

## 2020-10-03 DIAGNOSIS — E119 Type 2 diabetes mellitus without complications: Secondary | ICD-10-CM | POA: Diagnosis not present

## 2020-10-03 DIAGNOSIS — Z794 Long term (current) use of insulin: Secondary | ICD-10-CM | POA: Diagnosis not present

## 2020-10-20 DIAGNOSIS — R079 Chest pain, unspecified: Secondary | ICD-10-CM | POA: Diagnosis not present

## 2020-10-20 DIAGNOSIS — R0789 Other chest pain: Secondary | ICD-10-CM | POA: Diagnosis not present

## 2020-10-20 DIAGNOSIS — I10 Essential (primary) hypertension: Secondary | ICD-10-CM | POA: Diagnosis not present

## 2020-10-20 DIAGNOSIS — J80 Acute respiratory distress syndrome: Secondary | ICD-10-CM | POA: Diagnosis not present

## 2021-07-16 DIAGNOSIS — R0600 Dyspnea, unspecified: Secondary | ICD-10-CM | POA: Diagnosis not present

## 2021-07-16 DIAGNOSIS — I25110 Atherosclerotic heart disease of native coronary artery with unstable angina pectoris: Secondary | ICD-10-CM | POA: Diagnosis not present

## 2021-07-16 DIAGNOSIS — Z515 Encounter for palliative care: Secondary | ICD-10-CM | POA: Diagnosis not present

## 2021-07-16 DIAGNOSIS — I255 Ischemic cardiomyopathy: Secondary | ICD-10-CM | POA: Diagnosis not present

## 2021-09-05 DIAGNOSIS — I25110 Atherosclerotic heart disease of native coronary artery with unstable angina pectoris: Secondary | ICD-10-CM | POA: Diagnosis not present

## 2021-09-05 DIAGNOSIS — Z515 Encounter for palliative care: Secondary | ICD-10-CM | POA: Diagnosis not present

## 2021-09-05 DIAGNOSIS — I255 Ischemic cardiomyopathy: Secondary | ICD-10-CM | POA: Diagnosis not present

## 2021-09-05 DIAGNOSIS — R0600 Dyspnea, unspecified: Secondary | ICD-10-CM | POA: Diagnosis not present

## 2021-09-11 DIAGNOSIS — I255 Ischemic cardiomyopathy: Secondary | ICD-10-CM | POA: Diagnosis not present

## 2021-09-11 DIAGNOSIS — I509 Heart failure, unspecified: Secondary | ICD-10-CM | POA: Diagnosis not present

## 2021-09-11 DIAGNOSIS — R0600 Dyspnea, unspecified: Secondary | ICD-10-CM | POA: Diagnosis not present

## 2021-10-11 DIAGNOSIS — I255 Ischemic cardiomyopathy: Secondary | ICD-10-CM | POA: Diagnosis not present

## 2021-10-11 DIAGNOSIS — I509 Heart failure, unspecified: Secondary | ICD-10-CM | POA: Diagnosis not present

## 2021-10-11 DIAGNOSIS — R0600 Dyspnea, unspecified: Secondary | ICD-10-CM | POA: Diagnosis not present

## 2021-10-31 DIAGNOSIS — Z515 Encounter for palliative care: Secondary | ICD-10-CM | POA: Diagnosis not present

## 2021-10-31 DIAGNOSIS — I255 Ischemic cardiomyopathy: Secondary | ICD-10-CM | POA: Diagnosis not present

## 2021-10-31 DIAGNOSIS — I25110 Atherosclerotic heart disease of native coronary artery with unstable angina pectoris: Secondary | ICD-10-CM | POA: Diagnosis not present

## 2021-10-31 DIAGNOSIS — R0600 Dyspnea, unspecified: Secondary | ICD-10-CM | POA: Diagnosis not present

## 2021-11-11 DIAGNOSIS — R0600 Dyspnea, unspecified: Secondary | ICD-10-CM | POA: Diagnosis not present

## 2021-11-11 DIAGNOSIS — I255 Ischemic cardiomyopathy: Secondary | ICD-10-CM | POA: Diagnosis not present

## 2021-11-11 DIAGNOSIS — I509 Heart failure, unspecified: Secondary | ICD-10-CM | POA: Diagnosis not present

## 2021-11-25 DIAGNOSIS — R0789 Other chest pain: Secondary | ICD-10-CM | POA: Diagnosis not present

## 2021-11-25 DIAGNOSIS — R079 Chest pain, unspecified: Secondary | ICD-10-CM | POA: Diagnosis not present

## 2021-11-25 DIAGNOSIS — I10 Essential (primary) hypertension: Secondary | ICD-10-CM | POA: Diagnosis not present

## 2021-12-11 DIAGNOSIS — I255 Ischemic cardiomyopathy: Secondary | ICD-10-CM | POA: Diagnosis not present

## 2021-12-11 DIAGNOSIS — R0600 Dyspnea, unspecified: Secondary | ICD-10-CM | POA: Diagnosis not present

## 2021-12-11 DIAGNOSIS — I509 Heart failure, unspecified: Secondary | ICD-10-CM | POA: Diagnosis not present

## 2021-12-26 DIAGNOSIS — R0600 Dyspnea, unspecified: Secondary | ICD-10-CM | POA: Diagnosis not present

## 2021-12-26 DIAGNOSIS — I255 Ischemic cardiomyopathy: Secondary | ICD-10-CM | POA: Diagnosis not present

## 2021-12-26 DIAGNOSIS — I25110 Atherosclerotic heart disease of native coronary artery with unstable angina pectoris: Secondary | ICD-10-CM | POA: Diagnosis not present

## 2021-12-26 DIAGNOSIS — Z515 Encounter for palliative care: Secondary | ICD-10-CM | POA: Diagnosis not present

## 2022-01-11 DIAGNOSIS — I509 Heart failure, unspecified: Secondary | ICD-10-CM | POA: Diagnosis not present

## 2022-01-11 DIAGNOSIS — I255 Ischemic cardiomyopathy: Secondary | ICD-10-CM | POA: Diagnosis not present

## 2022-01-11 DIAGNOSIS — R0600 Dyspnea, unspecified: Secondary | ICD-10-CM | POA: Diagnosis not present

## 2022-05-21 DIAGNOSIS — E785 Hyperlipidemia, unspecified: Secondary | ICD-10-CM | POA: Diagnosis not present

## 2022-05-21 DIAGNOSIS — I440 Atrioventricular block, first degree: Secondary | ICD-10-CM | POA: Diagnosis not present

## 2022-05-21 DIAGNOSIS — G629 Polyneuropathy, unspecified: Secondary | ICD-10-CM | POA: Diagnosis not present

## 2022-05-21 DIAGNOSIS — I5022 Chronic systolic (congestive) heart failure: Secondary | ICD-10-CM | POA: Diagnosis not present

## 2022-05-21 DIAGNOSIS — D62 Acute posthemorrhagic anemia: Secondary | ICD-10-CM | POA: Diagnosis not present

## 2022-05-21 DIAGNOSIS — G2401 Drug induced subacute dyskinesia: Secondary | ICD-10-CM | POA: Diagnosis not present

## 2022-05-21 DIAGNOSIS — Z7984 Long term (current) use of oral hypoglycemic drugs: Secondary | ICD-10-CM | POA: Diagnosis not present

## 2022-05-21 DIAGNOSIS — Z66 Do not resuscitate: Secondary | ICD-10-CM | POA: Diagnosis not present

## 2022-05-21 DIAGNOSIS — Z515 Encounter for palliative care: Secondary | ICD-10-CM | POA: Diagnosis not present

## 2022-05-21 DIAGNOSIS — Z955 Presence of coronary angioplasty implant and graft: Secondary | ICD-10-CM | POA: Diagnosis not present

## 2022-05-21 DIAGNOSIS — Z8673 Personal history of transient ischemic attack (TIA), and cerebral infarction without residual deficits: Secondary | ICD-10-CM | POA: Diagnosis not present

## 2022-05-21 DIAGNOSIS — E11621 Type 2 diabetes mellitus with foot ulcer: Secondary | ICD-10-CM | POA: Diagnosis not present

## 2022-05-21 DIAGNOSIS — I6521 Occlusion and stenosis of right carotid artery: Secondary | ICD-10-CM | POA: Diagnosis not present

## 2022-05-21 DIAGNOSIS — I110 Hypertensive heart disease with heart failure: Secondary | ICD-10-CM | POA: Diagnosis not present

## 2022-05-21 DIAGNOSIS — G8929 Other chronic pain: Secondary | ICD-10-CM | POA: Diagnosis not present

## 2022-05-21 DIAGNOSIS — L97429 Non-pressure chronic ulcer of left heel and midfoot with unspecified severity: Secondary | ICD-10-CM | POA: Diagnosis not present

## 2022-05-21 DIAGNOSIS — Z7401 Bed confinement status: Secondary | ICD-10-CM | POA: Diagnosis not present

## 2022-05-21 DIAGNOSIS — I25110 Atherosclerotic heart disease of native coronary artery with unstable angina pectoris: Secondary | ICD-10-CM | POA: Diagnosis not present

## 2022-05-21 DIAGNOSIS — I252 Old myocardial infarction: Secondary | ICD-10-CM | POA: Diagnosis not present

## 2022-05-21 DIAGNOSIS — N390 Urinary tract infection, site not specified: Secondary | ICD-10-CM | POA: Diagnosis not present

## 2022-05-21 DIAGNOSIS — E1165 Type 2 diabetes mellitus with hyperglycemia: Secondary | ICD-10-CM | POA: Diagnosis not present

## 2022-05-21 DIAGNOSIS — R079 Chest pain, unspecified: Secondary | ICD-10-CM | POA: Diagnosis not present

## 2022-05-21 DIAGNOSIS — Z1152 Encounter for screening for COVID-19: Secondary | ICD-10-CM | POA: Diagnosis not present

## 2022-06-07 DIAGNOSIS — I2510 Atherosclerotic heart disease of native coronary artery without angina pectoris: Secondary | ICD-10-CM | POA: Diagnosis not present

## 2022-06-07 DIAGNOSIS — G4700 Insomnia, unspecified: Secondary | ICD-10-CM | POA: Diagnosis not present

## 2022-06-07 DIAGNOSIS — I10 Essential (primary) hypertension: Secondary | ICD-10-CM | POA: Diagnosis not present

## 2022-06-07 DIAGNOSIS — E785 Hyperlipidemia, unspecified: Secondary | ICD-10-CM | POA: Diagnosis not present

## 2022-06-10 DIAGNOSIS — I10 Essential (primary) hypertension: Secondary | ICD-10-CM | POA: Diagnosis not present

## 2022-06-10 DIAGNOSIS — E119 Type 2 diabetes mellitus without complications: Secondary | ICD-10-CM | POA: Diagnosis not present

## 2022-06-10 DIAGNOSIS — E531 Pyridoxine deficiency: Secondary | ICD-10-CM | POA: Diagnosis not present

## 2022-06-10 DIAGNOSIS — G4700 Insomnia, unspecified: Secondary | ICD-10-CM | POA: Diagnosis not present

## 2022-06-10 DIAGNOSIS — R1311 Dysphagia, oral phase: Secondary | ICD-10-CM | POA: Diagnosis not present

## 2022-06-10 DIAGNOSIS — E785 Hyperlipidemia, unspecified: Secondary | ICD-10-CM | POA: Diagnosis not present

## 2022-06-10 DIAGNOSIS — M6281 Muscle weakness (generalized): Secondary | ICD-10-CM | POA: Diagnosis not present

## 2022-06-10 DIAGNOSIS — R52 Pain, unspecified: Secondary | ICD-10-CM | POA: Diagnosis not present

## 2022-06-10 DIAGNOSIS — R079 Chest pain, unspecified: Secondary | ICD-10-CM | POA: Diagnosis not present

## 2022-06-10 DIAGNOSIS — Z741 Need for assistance with personal care: Secondary | ICD-10-CM | POA: Diagnosis not present

## 2022-06-10 DIAGNOSIS — I2510 Atherosclerotic heart disease of native coronary artery without angina pectoris: Secondary | ICD-10-CM | POA: Diagnosis not present

## 2022-06-10 DIAGNOSIS — R262 Difficulty in walking, not elsewhere classified: Secondary | ICD-10-CM | POA: Diagnosis not present

## 2022-06-11 DIAGNOSIS — G2401 Drug induced subacute dyskinesia: Secondary | ICD-10-CM | POA: Diagnosis not present

## 2022-06-11 DIAGNOSIS — E785 Hyperlipidemia, unspecified: Secondary | ICD-10-CM | POA: Diagnosis not present

## 2022-06-11 DIAGNOSIS — N390 Urinary tract infection, site not specified: Secondary | ICD-10-CM | POA: Diagnosis not present

## 2022-06-11 DIAGNOSIS — I10 Essential (primary) hypertension: Secondary | ICD-10-CM | POA: Diagnosis not present

## 2022-06-11 DIAGNOSIS — I2510 Atherosclerotic heart disease of native coronary artery without angina pectoris: Secondary | ICD-10-CM | POA: Diagnosis not present

## 2022-06-11 DIAGNOSIS — Z951 Presence of aortocoronary bypass graft: Secondary | ICD-10-CM | POA: Diagnosis not present

## 2022-06-11 DIAGNOSIS — R0789 Other chest pain: Secondary | ICD-10-CM | POA: Diagnosis not present

## 2022-06-11 DIAGNOSIS — G4700 Insomnia, unspecified: Secondary | ICD-10-CM | POA: Diagnosis not present

## 2022-06-11 DIAGNOSIS — E119 Type 2 diabetes mellitus without complications: Secondary | ICD-10-CM | POA: Diagnosis not present

## 2022-06-11 DIAGNOSIS — E1165 Type 2 diabetes mellitus with hyperglycemia: Secondary | ICD-10-CM | POA: Diagnosis not present

## 2022-06-11 DIAGNOSIS — R531 Weakness: Secondary | ICD-10-CM | POA: Diagnosis not present

## 2022-06-11 DIAGNOSIS — R079 Chest pain, unspecified: Secondary | ICD-10-CM | POA: Diagnosis not present

## 2022-06-12 DIAGNOSIS — G4700 Insomnia, unspecified: Secondary | ICD-10-CM | POA: Diagnosis not present

## 2022-06-12 DIAGNOSIS — K219 Gastro-esophageal reflux disease without esophagitis: Secondary | ICD-10-CM | POA: Diagnosis not present

## 2022-06-12 DIAGNOSIS — E785 Hyperlipidemia, unspecified: Secondary | ICD-10-CM | POA: Diagnosis not present

## 2022-06-12 DIAGNOSIS — I2510 Atherosclerotic heart disease of native coronary artery without angina pectoris: Secondary | ICD-10-CM | POA: Diagnosis not present

## 2022-06-12 DIAGNOSIS — I10 Essential (primary) hypertension: Secondary | ICD-10-CM | POA: Diagnosis not present

## 2022-06-13 DIAGNOSIS — I10 Essential (primary) hypertension: Secondary | ICD-10-CM | POA: Diagnosis not present

## 2022-06-13 DIAGNOSIS — E785 Hyperlipidemia, unspecified: Secondary | ICD-10-CM | POA: Diagnosis not present

## 2022-06-13 DIAGNOSIS — G4700 Insomnia, unspecified: Secondary | ICD-10-CM | POA: Diagnosis not present

## 2022-06-13 DIAGNOSIS — E119 Type 2 diabetes mellitus without complications: Secondary | ICD-10-CM | POA: Diagnosis not present

## 2022-06-14 DIAGNOSIS — I2510 Atherosclerotic heart disease of native coronary artery without angina pectoris: Secondary | ICD-10-CM | POA: Diagnosis not present

## 2022-06-14 DIAGNOSIS — E785 Hyperlipidemia, unspecified: Secondary | ICD-10-CM | POA: Diagnosis not present

## 2022-06-14 DIAGNOSIS — G4700 Insomnia, unspecified: Secondary | ICD-10-CM | POA: Diagnosis not present

## 2022-06-14 DIAGNOSIS — I10 Essential (primary) hypertension: Secondary | ICD-10-CM | POA: Diagnosis not present

## 2022-06-14 DIAGNOSIS — E119 Type 2 diabetes mellitus without complications: Secondary | ICD-10-CM | POA: Diagnosis not present

## 2022-06-17 DIAGNOSIS — S0990XA Unspecified injury of head, initial encounter: Secondary | ICD-10-CM | POA: Diagnosis not present

## 2022-06-17 DIAGNOSIS — G4700 Insomnia, unspecified: Secondary | ICD-10-CM | POA: Diagnosis not present

## 2022-06-17 DIAGNOSIS — I10 Essential (primary) hypertension: Secondary | ICD-10-CM | POA: Diagnosis not present

## 2022-06-17 DIAGNOSIS — Z043 Encounter for examination and observation following other accident: Secondary | ICD-10-CM | POA: Diagnosis not present

## 2022-06-17 DIAGNOSIS — I2510 Atherosclerotic heart disease of native coronary artery without angina pectoris: Secondary | ICD-10-CM | POA: Diagnosis not present

## 2022-06-17 DIAGNOSIS — M6281 Muscle weakness (generalized): Secondary | ICD-10-CM | POA: Diagnosis not present

## 2022-06-17 DIAGNOSIS — E785 Hyperlipidemia, unspecified: Secondary | ICD-10-CM | POA: Diagnosis not present

## 2022-06-18 DIAGNOSIS — G4700 Insomnia, unspecified: Secondary | ICD-10-CM | POA: Diagnosis not present

## 2022-06-18 DIAGNOSIS — I10 Essential (primary) hypertension: Secondary | ICD-10-CM | POA: Diagnosis not present

## 2022-06-18 DIAGNOSIS — I2510 Atherosclerotic heart disease of native coronary artery without angina pectoris: Secondary | ICD-10-CM | POA: Diagnosis not present

## 2022-06-18 DIAGNOSIS — E785 Hyperlipidemia, unspecified: Secondary | ICD-10-CM | POA: Diagnosis not present

## 2022-06-19 DIAGNOSIS — E785 Hyperlipidemia, unspecified: Secondary | ICD-10-CM | POA: Diagnosis not present

## 2022-06-19 DIAGNOSIS — E1165 Type 2 diabetes mellitus with hyperglycemia: Secondary | ICD-10-CM | POA: Diagnosis not present

## 2022-06-19 DIAGNOSIS — G4700 Insomnia, unspecified: Secondary | ICD-10-CM | POA: Diagnosis not present

## 2022-06-19 DIAGNOSIS — I10 Essential (primary) hypertension: Secondary | ICD-10-CM | POA: Diagnosis not present

## 2022-06-19 DIAGNOSIS — I2510 Atherosclerotic heart disease of native coronary artery without angina pectoris: Secondary | ICD-10-CM | POA: Diagnosis not present

## 2022-06-20 DIAGNOSIS — R5383 Other fatigue: Secondary | ICD-10-CM | POA: Diagnosis not present

## 2022-06-20 DIAGNOSIS — G4700 Insomnia, unspecified: Secondary | ICD-10-CM | POA: Diagnosis not present

## 2022-06-20 DIAGNOSIS — I10 Essential (primary) hypertension: Secondary | ICD-10-CM | POA: Diagnosis not present

## 2022-06-20 DIAGNOSIS — Z0000 Encounter for general adult medical examination without abnormal findings: Secondary | ICD-10-CM | POA: Diagnosis not present

## 2022-06-20 DIAGNOSIS — E785 Hyperlipidemia, unspecified: Secondary | ICD-10-CM | POA: Diagnosis not present

## 2022-06-20 DIAGNOSIS — I2510 Atherosclerotic heart disease of native coronary artery without angina pectoris: Secondary | ICD-10-CM | POA: Diagnosis not present

## 2022-11-14 DIAGNOSIS — R0789 Other chest pain: Secondary | ICD-10-CM | POA: Diagnosis not present
